# Patient Record
Sex: Female | Born: 1985 | Race: White | Hispanic: No | Marital: Married | Smoking: Never smoker
Health system: Southern US, Community
[De-identification: ages and names within clinical notes are randomized; demographics above are authoritative.]

## PROBLEM LIST (undated history)

## (undated) ENCOUNTER — Inpatient Hospital Stay (HOSPITAL_COMMUNITY): Payer: Self-pay

## (undated) DIAGNOSIS — J9801 Acute bronchospasm: Secondary | ICD-10-CM

## (undated) DIAGNOSIS — O1400 Mild to moderate pre-eclampsia, unspecified trimester: Secondary | ICD-10-CM

## (undated) DIAGNOSIS — Z3490 Encounter for supervision of normal pregnancy, unspecified, unspecified trimester: Secondary | ICD-10-CM

## (undated) DIAGNOSIS — Z789 Other specified health status: Secondary | ICD-10-CM

## (undated) DIAGNOSIS — D62 Acute posthemorrhagic anemia: Secondary | ICD-10-CM

## (undated) HISTORY — DX: Mild to moderate pre-eclampsia, unspecified trimester: O14.00

## (undated) HISTORY — DX: Acute bronchospasm: J98.01

## (undated) HISTORY — DX: Encounter for supervision of normal pregnancy, unspecified, unspecified trimester: Z34.90

---

## 2011-04-02 HISTORY — PX: WISDOM TOOTH EXTRACTION: SHX21

## 2012-11-28 ENCOUNTER — Ambulatory Visit (INDEPENDENT_AMBULATORY_CARE_PROVIDER_SITE_OTHER): Payer: 59 | Admitting: Gynecology

## 2012-11-28 ENCOUNTER — Encounter: Payer: Self-pay | Admitting: Gynecology

## 2012-11-28 VITALS — BP 110/74 | Ht 63.5 in | Wt 215.0 lb

## 2012-11-28 DIAGNOSIS — Z0000 Encounter for general adult medical examination without abnormal findings: Secondary | ICD-10-CM

## 2012-11-28 DIAGNOSIS — Z309 Encounter for contraceptive management, unspecified: Secondary | ICD-10-CM

## 2012-11-28 DIAGNOSIS — Z124 Encounter for screening for malignant neoplasm of cervix: Secondary | ICD-10-CM

## 2012-11-28 DIAGNOSIS — Z01419 Encounter for gynecological examination (general) (routine) without abnormal findings: Secondary | ICD-10-CM

## 2012-11-28 LAB — POCT URINALYSIS DIPSTICK
Blood, UA: 2
Urobilinogen, UA: NEGATIVE
pH, UA: 5

## 2012-11-28 MED ORDER — LEVONORGESTREL-ETHINYL ESTRAD 0.1-20 MG-MCG PO TABS: 1.0000 | ORAL_TABLET | Freq: Every day | ORAL | Status: DC

## 2012-11-28 NOTE — Patient Instructions (Addendum)
Contraception Choices  Contraception (birth control) is the use of any methods or devices to prevent pregnancy. Below are some methods to help avoid pregnancy.  HORMONAL METHODS   · Contraceptive implant. This is a thin, plastic tube containing progesterone hormone. It does not contain estrogen hormone. Your caregiver inserts the tube in the inner part of the upper arm. The tube can remain in place for up to 3 years. After 3 years, the implant must be removed. The implant prevents the ovaries from releasing an egg (ovulation), thickens the cervical mucus which prevents sperm from entering the uterus, and thins the lining of the inside of the uterus.  · Progesterone-only injections. These injections are given every 3 months by your caregiver to prevent pregnancy. This synthetic progesterone hormone stops the ovaries from releasing eggs. It also thickens cervical mucus and changes the uterine lining. This makes it harder for sperm to survive in the uterus.  · Birth control pills. These pills contain estrogen and progesterone hormone. They work by stopping the egg from forming in the ovary (ovulation). Birth control pills are prescribed by a caregiver. Birth control pills can also be used to treat heavy periods.  · Minipill. This type of birth control pill contains only the progesterone hormone. They are taken every day of each month and must be prescribed by your caregiver.  · Birth control patch. The patch contains hormones similar to those in birth control pills. It must be changed once a week and is prescribed by a caregiver.  · Vaginal ring. The ring contains hormones similar to those in birth control pills. It is left in the vagina for 3 weeks, removed for 1 week, and then a new one is put back in place. The patient must be comfortable inserting and removing the ring from the vagina. A caregiver's prescription is necessary.  · Emergency contraception. Emergency contraceptives prevent pregnancy after unprotected  sexual intercourse. This pill can be taken right after sex or up to 5 days after unprotected sex. It is most effective the sooner you take the pills after having sexual intercourse. Emergency contraceptive pills are available without a prescription. Check with your pharmacist. Do not use emergency contraception as your only form of birth control.  BARRIER METHODS   · Female condom. This is a thin sheath (latex or rubber) that is worn over the penis during sexual intercourse. It can be used with spermicide to increase effectiveness.  · Female condom. This is a soft, loose-fitting sheath that is put into the vagina before sexual intercourse.  · Diaphragm. This is a soft, latex, dome-shaped barrier that must be fitted by a caregiver. It is inserted into the vagina, along with a spermicidal jelly. It is inserted before intercourse. The diaphragm should be left in the vagina for 6 to 8 hours after intercourse.  · Cervical cap. This is a round, soft, latex or plastic cup that fits over the cervix and must be fitted by a caregiver. The cap can be left in place for up to 48 hours after intercourse.  · Sponge. This is a soft, circular piece of polyurethane foam. The sponge has spermicide in it. It is inserted into the vagina after wetting it and before sexual intercourse.  · Spermicides. These are chemicals that kill or block sperm from entering the cervix and uterus. They come in the form of creams, jellies, suppositories, foam, or tablets. They do not require a prescription. They are inserted into the vagina with an applicator before having sexual intercourse.   The process must be repeated every time you have sexual intercourse.  INTRAUTERINE CONTRACEPTION  · Intrauterine device (IUD). This is a T-shaped device that is put in a woman's uterus during a menstrual period to prevent pregnancy. There are 2 types:  · Copper IUD. This type of IUD is wrapped in copper wire and is placed inside the uterus. Copper makes the uterus and  fallopian tubes produce a fluid that kills sperm. It can stay in place for 10 years.  · Hormone IUD. This type of IUD contains the hormone progestin (synthetic progesterone). The hormone thickens the cervical mucus and prevents sperm from entering the uterus, and it also thins the uterine lining to prevent implantation of a fertilized egg. The hormone can weaken or kill the sperm that get into the uterus. It can stay in place for 5 years.  PERMANENT METHODS OF CONTRACEPTION  · Female tubal ligation. This is when the woman's fallopian tubes are surgically sealed, tied, or blocked to prevent the egg from traveling to the uterus.  · Female sterilization. This is when the female has the tubes that carry sperm tied off (vasectomy). This blocks sperm from entering the vagina during sexual intercourse. After the procedure, the man can still ejaculate fluid (semen).  NATURAL PLANNING METHODS  · Natural family planning. This is not having sexual intercourse or using a barrier method (condom, diaphragm, cervical cap) on days the woman could become pregnant.  · Calendar method. This is keeping track of the length of each menstrual cycle and identifying when you are fertile.  · Ovulation method. This is avoiding sexual intercourse during ovulation.  · Symptothermal method. This is avoiding sexual intercourse during ovulation, using a thermometer and ovulation symptoms.  · Post-ovulation method. This is timing sexual intercourse after you have ovulated.  Regardless of which type or method of contraception you choose, it is important that you use condoms to protect against the transmission of sexually transmitted diseases (STDs). Talk with your caregiver about which form of contraception is most appropriate for you.  Document Released: 06/18/2005 Document Revised: 09/10/2011 Document Reviewed: 10/25/2010  [employer]® Patient Information ©2014 [employer].

## 2012-11-28 NOTE — Progress Notes (Signed)
27 y.o.   Married    Caucasian   female   G0P0000   here for annual exam. Pt reports 15# since being on oc, married 1y not interested in conception at this time, maybe 1y.    Patient's last menstrual period was 11/16/2012.          Sexually active: yes  The current method of family planning is OCP (estrogen/progesterone).    Exercising: walking 7x/wk  Last mammogram: none  Last pap smear: never had one History of abnormal pap: no Smoking: no Alcohol: no Last colonoscopy: none Last Bone Density:  none Last tetanus shot: 03/2012 Last cholesterol check: none BSE:  no  Hgb: 14.2             Urine:   Family History  Problem Relation Age of Onset  . Hypertension Mother   . Polycystic ovary syndrome Sister   . Diabetes Maternal Grandmother   . Thyroid disease Maternal Grandmother   . Diabetes Maternal Grandfather   . Breast cancer Paternal Grandmother     There are no active problems to display for this patient.   No past medical history on file.  Past Surgical History  Procedure Laterality Date  . Wisdom tooth extraction  04/2011    Allergies: Review of patient's allergies indicates no known allergies.  Current Outpatient Prescriptions  Medication Sig Dispense Refill  . levonorgestrel-ethinyl estradiol (AVIANE,ALESSE,LESSINA) 0.1-20 MG-MCG tablet Take 1 tablet by mouth daily.       No current facility-administered medications for this visit.    ROS: Pertinent items are noted in HPI.  Social Hx:    Exam:    BP 110/74  Ht 5' 3.5" (1.613 m)  Wt 215 lb (97.523 kg)  BMI 37.48 kg/m2  LMP 11/16/2012   Wt Readings from Last 3 Encounters:  11/28/12 215 lb (97.523 kg)     Ht Readings from Last 3 Encounters:  11/28/12 5' 3.5" (1.613 m)    General appearance: alert, cooperative and appears stated age Head: Normocephalic, without obvious abnormality, atraumatic Neck: no adenopathy, supple, symmetrical, trachea midline and thyroid not enlarged, symmetric, no  tenderness/mass/nodules Lungs: clear to auscultation bilaterally Breasts: Inspection negative, No nipple retraction or dimpling, No nipple discharge or bleeding, No axillary or supraclavicular adenopathy, Normal to palpation without dominant masses Heart: regular rate and rhythm Abdomen: soft, non-tender; bowel sounds normal; no masses,  no organomegaly Extremities: extremities normal, atraumatic, no cyanosis or edema Skin: Skin color, texture, turgor normal. No rashes or lesions Lymph nodes: Cervical, supraclavicular, and axillary nodes normal. No abnormal inguinal nodes palpated Neurologic: Grossly normal   Pelvic: External genitalia: questionable ectopic nipple on lateral right labia unchanged in  Childhood, fleshy with small head              Urethra:  normal appearing urethra with no masses, tenderness or lesions              Bartholins and Skenes: normal                 Vagina: normal appearing vagina with normal color and discharge, no lesions              Cervix: normal appearance              Pap taken: yes        Bimanual Exam:  Uterus:  uterus is normal size, shape, consistency and nontender  Adnexa: normal adnexa in size, nontender and no masses                                      Rectovaginal: Confirms                                      Anus:  normal sphincter tone, no lesions  A: normal exam Contraceptive management     P: pap smear with reflex counseled on use and side effects of OCP's Discussed diet and exercise  return annually or prn     An After Visit Summary was printed and given to the patient.

## 2013-05-07 ENCOUNTER — Other Ambulatory Visit: Payer: Self-pay

## 2014-02-26 ENCOUNTER — Ambulatory Visit (INDEPENDENT_AMBULATORY_CARE_PROVIDER_SITE_OTHER): Payer: 59 | Admitting: Gynecology

## 2014-02-26 ENCOUNTER — Encounter: Payer: Self-pay | Admitting: Gynecology

## 2014-02-26 VITALS — BP 118/66 | HR 76 | Resp 14 | Ht 63.5 in | Wt 227.0 lb

## 2014-02-26 DIAGNOSIS — Z0000 Encounter for general adult medical examination without abnormal findings: Secondary | ICD-10-CM

## 2014-02-26 DIAGNOSIS — Z01419 Encounter for gynecological examination (general) (routine) without abnormal findings: Secondary | ICD-10-CM

## 2014-02-26 DIAGNOSIS — Z3169 Encounter for other general counseling and advice on procreation: Secondary | ICD-10-CM

## 2014-02-26 DIAGNOSIS — Z124 Encounter for screening for malignant neoplasm of cervix: Secondary | ICD-10-CM

## 2014-02-26 LAB — HEMOGLOBIN, FINGERSTICK: Hemoglobin, fingerstick: 13.7 g/dL (ref 12.0–16.0)

## 2014-02-26 NOTE — Progress Notes (Signed)
28 y.o. Married Caucasian female   G0P0000 here for annual exam. Pt is currently sexually active.  She reports using condoms on a regular basis.  First sexual activity at 28 years old, 1 number of lifetime partners. Pt stopped op in March 2015 to try to concieve, cycles have been regular, minimal dysmenorrhea.  Pt's husband works out of town for now.  No dyspareunia.  Patient's last menstrual period was 02/11/2014.          Sexually active: Yes.    The current method of family planning is none.    Exercising: Yes.    walking occassionally Last pap: 11/28/12 Neg Alcohol: no Tobacco: no Drugs: no Gardisil: no, completed:   Hgb: 13.7 ; Urine: Leuks 2  Health Maintenance  Topic Date Due  . Tetanus/tdap  11/10/2004  . Influenza Vaccine  01/30/2014  . Pap Smear  11/29/2015    Family History  Problem Relation Age of Onset  . Hypertension Mother   . Polycystic ovary syndrome Sister   . Diabetes Maternal Grandmother   . Thyroid disease Maternal Grandmother   . Diabetes Maternal Grandfather   . Breast cancer Paternal Grandmother     There are no active problems to display for this patient.   No past medical history on file.  Past Surgical History  Procedure Laterality Date  . Wisdom tooth extraction  04/2011    Allergies: Review of patient's allergies indicates no known allergies.  Current Outpatient Prescriptions  Medication Sig Dispense Refill  . levonorgestrel-ethinyl estradiol (AVIANE,ALESSE,LESSINA) 0.1-20 MG-MCG tablet Take 1 tablet by mouth daily.      Marland Kitchen levonorgestrel-ethinyl estradiol (AVIANE,ALESSE,LESSINA) 0.1-20 MG-MCG tablet Take 1 tablet by mouth daily.  1 Package  12   No current facility-administered medications for this visit.    ROS: Pertinent items are noted in HPI.  Exam:    BP 118/66  Pulse 76  Resp 14  Ht 5' 3.5" (1.613 m)  Wt 227 lb (102.967 kg)  BMI 39.58 kg/m2  LMP 02/11/2014 Weight change: @ Last 3 height recordings:  Ht  Readings from Last 3 Encounters:  02/26/14 5' 3.5" (1.613 m)  11/28/12 5' 3.5" (1.613 m)   General appearance: alert, cooperative and appears stated age Head: Normocephalic, without obvious abnormality, atraumatic Neck: no adenopathy, no carotid bruit, no JVD, supple, symmetrical, trachea midline and thyroid not enlarged, symmetric, no tenderness/mass/nodules Lungs: clear to auscultation bilaterally Breasts: normal appearance, no masses or tenderness Heart: regular rate and rhythm, S1, S2 normal, no murmur, click, rub or gallop Abdomen: soft, non-tender; bowel sounds normal; no masses,  no organomegaly Extremities: extremities normal, atraumatic, no cyanosis or edema Skin: Skin color, texture, turgor normal. No rashes or lesions Lymph nodes: Cervical, supraclavicular, and axillary nodes normal. no inguinal nodes palpated Neurologic: Grossly normal   Pelvic: External genitalia:  no lesions              Urethra: normal appearing urethra with no masses, tenderness or lesions              Bartholins and Skenes: Bartholin's, Urethra, Skene's normal                 Vagina: normal appearing vagina with normal color and discharge, no lesions              Cervix: normal appearance              Pap taken: Yes.          Bimanual Exam:  Uterus:  uterus is normal size, shape, consistency and nontender                                      Adnexa:    normal adnexa in size, nontender and no masses                                      Rectovaginal: Confirms                                      Anus:  normal sphincter tone, no lesions        1. Routine gynecological examination counseled on breast self exam, adequate intake of calcium and vitamin D, diet and exercise return annually or prn  2. Laboratory examination ordered as part of a routine general medical examination  - POCT Urinalysis Dipstick - Hemoglobin, fingerstick  3. Screening for cervical cancer Guidelines reviewed - PAP with  Reflex to HPV (IPS)  4. Pre-conception counseling Pt and husband have not been together except for a few weekend,sister with PCOS.  discussed coital frequency, usual time to conceive, to watch cycle regularity based on sister's history.  Pt has never been dx with pcos. Will not check rubella as she is not interested in vaccine at this time if not immune.    An After Visit Summary was printed and given to the patient.

## 2014-02-28 ENCOUNTER — Encounter: Payer: Self-pay | Admitting: Gynecology

## 2014-03-01 NOTE — Telephone Encounter (Signed)
Spoke with patient. Patient states that she is not having any vaginal itching today. Took azo last night. Patient has used the Monistat OTC 1 day less than a month ago. "Maybe I just had some irritation over the weekend and now it is okay." Advised patient if symptoms reoccur to use OTC Monistat 3 or 5 with hydrocortisone ointment. Advised to keep area clean and dry. Do not stay in wet clothing or tight exercise clothing for long periods of time. Advised to wear white cotton underwear and no thongs. Can try Aveeno sitz bath to relieve itching and irritation. Patient is agreeable and will try this is symptoms return. Will call back if this does not resolve symptoms to make an appointment with Dr.Lathrop.  Routing to provider for final review. Patient agreeable to disposition. Will close encounter

## 2014-03-01 NOTE — Telephone Encounter (Signed)
Left message to call Anakin Varkey at [phone]. 

## 2014-03-02 LAB — IPS PAP TEST WITH REFLEX TO HPV

## 2014-03-26 ENCOUNTER — Ambulatory Visit (INDEPENDENT_AMBULATORY_CARE_PROVIDER_SITE_OTHER): Payer: 59 | Admitting: Obstetrics and Gynecology

## 2014-03-26 ENCOUNTER — Encounter: Payer: Self-pay | Admitting: Obstetrics and Gynecology

## 2014-03-26 VITALS — BP 124/70 | HR 70 | Ht 63.5 in | Wt 227.8 lb

## 2014-03-26 DIAGNOSIS — B373 Candidiasis of vulva and vagina: Secondary | ICD-10-CM

## 2014-03-26 DIAGNOSIS — B3731 Acute candidiasis of vulva and vagina: Secondary | ICD-10-CM

## 2014-03-26 MED ORDER — FLUCONAZOLE 150 MG PO TABS: 150.0000 mg | ORAL_TABLET | Freq: Once | ORAL | Status: DC

## 2014-03-26 MED ORDER — NYSTATIN-TRIAMCINOLONE 100000-0.1 UNIT/GM-% EX CREA
1.0000 | TOPICAL_CREAM | Freq: Two times a day (BID) | CUTANEOUS | Status: DC
Start: 2014-03-26 — End: 2014-08-13

## 2014-03-26 NOTE — Progress Notes (Signed)
Patient ID: Vanessa Rice, female   DOB: 12-11-1985, 28 y.o.   MRN: [id number] GYNECOLOGY VISIT  PCP:   [facility]([place], [place])  Referring provider:   HPI: 28 y.o.   Married  Caucasian  female   G0P0000 with Patient's last menstrual period was 03/11/2014.   here for vaginal irritation/itching--off & on.  No discharge and no odor.   Itching, burning, irritation externally.  Happens off and on.  No odor.  Took Monistat in August.   Seen 02/18/14 for annual exam.  Had itching after visit.  Pap WNL and did not show any BV or yeast.   Took abx in July for bronchitis.  No new exposures.   Taking PNV. Not currently sexually active but would like future pregnancy.   GYNECOLOGIC HISTORY: Patient's last menstrual period was 03/11/2014. Sexually active:  yes Partner preference: female Contraception: none   Menopausal hormone therapy:  DES exposure: no   Blood transfusions:  no  Sexually transmitted diseases: no   GYN procedures and prior surgeries:  no Last mammogram:   n/a              Last pap and high risk HPV testing:  02-26-14 wnl  History of abnormal pap smear:  no   OB History   Grav Para Term Preterm Abortions TAB SAB Ect Mult Living         LIFESTYLE: Exercise:               Tobacco: no Alcohol:   no Drug use:  no  There are no active problems to display for this patient.   Past Medical History  Diagnosis Date  . Bronchial spasms     Past Surgical History  Procedure Laterality Date  . Wisdom tooth extraction  04/2011    No current outpatient prescriptions on file.   No current facility-administered medications for this visit.     ALLERGIES: Review of patient's allergies indicates no known allergies.  Family History  Problem Relation Age of Onset  . Hypertension Mother   . Polycystic ovary syndrome Sister   . Diabetes Maternal Grandmother   . Thyroid disease Maternal Grandmother   . Diabetes Maternal Grandfather    . Breast cancer Paternal Grandmother     History   Social History  . Marital Status: Married    Spouse Name: N/A    Number of Children: N/A  . Years of Education: N/A   Occupational History  . Not on file.   Social History Main Topics  . Smoking status: Never Smoker   . Smokeless tobacco: Never Used  . Alcohol Use: No  . Drug Use: No  . Sexual Activity: Yes    Partners: Male    Birth Control/ Protection: None   Other Topics Concern  . Not on file   Social History Narrative  . No narrative on file    ROS:  Pertinent items are noted in HPI.  PHYSICAL EXAMINATION:    BP 124/70  Pulse 70  Ht 5' 3.5" (1.613 m)  Wt 227 lb 12.8 oz (103.329 kg)  BMI 39.71 kg/m2  LMP 03/11/2014   Wt Readings from Last 3 Encounters:  03/26/14 227 lb 12.8 oz (103.329 kg)  02/26/14 227 lb (102.967 kg)  11/28/12 215 lb (97.523 kg)     Ht Readings from Last 3 Encounters:  03/26/14 5' 3.5" (1.613 m)  02/26/14 5' 3.5" (1.613 m)  11/28/12 5'  3.5" (1.613 m)    General appearance: alert, cooperative and appears stated age  Pelvic: External genitalia:  no lesions              Urethra:  normal appearing urethra with no masses, tenderness or lesions              Bartholins and Skenes: normal                 Vagina: normal appearing vagina with normal color and discharge, no lesions, white clumpy discharge.               Cervix: normal appearance                 Bimanual Exam:  Uterus:  uterus is normal size, shape, consistency and nontender                                      Adnexa: normal adnexa in size, nontender and no masses                                       Wet prep - ph 4.5, positive hyphae.  Negative clue cells and trichomonas.   ASSESSMENT  Yeast vaginitis.   PLAN  Discussed risk factors for yeast infections.  Diflucan and Mycolog II.  See orders.  Instructed in use.  Discussed probiotics and yogurt as ways to decrease yeast infections.    An After Visit Summary was  printed and given to the patient.  15 minutes face to face time of which over 50% was spent in counseling.

## 2014-03-26 NOTE — Patient Instructions (Signed)

## 2014-08-09 ENCOUNTER — Telehealth: Payer: Self-pay | Admitting: Obstetrics and Gynecology

## 2014-08-09 NOTE — Telephone Encounter (Signed)
Spoke with patient. Patient states that her LMP was 1/7. Patient took UPT yesterday that was positive. Would like to come in for confirmation appointment. Requesting appointment on 2/12. Appointment scheduled for 2/12 at 9:15am with Verner Choleborah S. Leonard CNM. Patient is agreeable to date and time.  Routing to provider for final review. Patient agreeable to disposition. Will close encounter

## 2014-08-09 NOTE — Telephone Encounter (Signed)
Patient took a pregnancy test it was positive and she wants to talk with the nurse.

## 2014-08-13 ENCOUNTER — Ambulatory Visit (INDEPENDENT_AMBULATORY_CARE_PROVIDER_SITE_OTHER): Payer: 59 | Admitting: Certified Nurse Midwife

## 2014-08-13 ENCOUNTER — Encounter: Payer: Self-pay | Admitting: Certified Nurse Midwife

## 2014-08-13 VITALS — BP 110/70 | HR 96 | Resp 18 | Ht 63.5 in | Wt 227.0 lb

## 2014-08-13 DIAGNOSIS — Z3201 Encounter for pregnancy test, result positive: Secondary | ICD-10-CM

## 2014-08-13 DIAGNOSIS — N912 Amenorrhea, unspecified: Secondary | ICD-10-CM

## 2014-08-13 LAB — POCT URINE PREGNANCY: Preg Test, Ur: POSITIVE

## 2014-08-13 NOTE — Patient Instructions (Signed)
Prenatal Care  WHAT IS PRENATAL CARE?  Prenatal care means health care during your pregnancy, before your baby is born. It is very important to take care of yourself and your baby during your pregnancy by:   Getting early prenatal care. If you know you are pregnant, or think you might be pregnant, call your health care provider as soon as possible. Schedule a visit for a prenatal exam.  Getting regular prenatal care. Follow your health care provider's schedule for blood and other necessary tests. Do not miss appointments.  Doing everything you can to keep yourself and your baby healthy during your pregnancy.  Getting complete care. Prenatal care should include evaluation of the medical, dietary, educational, psychological, and social needs of you and your significant other. The medical and genetic history of your family and the family of your baby's father should be discussed with your health care provider.  Discussing with your health care provider:  Prescription, over-the-counter, and herbal medicines that you take.  Any history of substance abuse, alcohol use, smoking, and illegal drug use.  Any history of domestic abuse and violence.  Immunizations you have received.  Your nutrition and diet.  The amount of exercise you do.  Any environmental and occupational hazards to which you are exposed.  History of sexually transmitted infections for both you and your partner.  Previous pregnancies you have had. WHY IS PRENATAL CARE SO IMPORTANT?  By regularly seeing your health care provider, you help ensure that problems can be identified early so that they can be treated as soon as possible. Other problems might be prevented. Many studies have shown that early and regular prenatal care is important for the health of mothers and their babies.  HOW CAN I TAKE CARE OF MYSELF WHILE I AM PREGNANT?  Here are ways to take care of yourself and your baby:   Start or continue taking your  multivitamin with 400 micrograms (mcg) of folic acid every day.  Get early and regular prenatal care. It is very important to see a health care provider during your pregnancy. Your health care provider will check at each visit to make sure that you and your baby are healthy. If there are any problems, action can be taken right away to help you and your baby.  Eat a healthy diet that includes:  Fruits.  Vegetables.  Foods low in saturated fat.  Whole grains.  Calcium-rich foods, such as milk, yogurt, and hard cheeses.  Drink 6-8 glasses of liquids a day.  Unless your health care provider tells you not to, try to be physically active for 30 minutes, most days of the week. If you are pressed for time, you can get your activity in through 10-minute segments, three times a day.  Do not smoke, drink alcohol, or use drugs. These can cause long-term damage to your baby. Talk with your health care provider about steps to take to stop smoking. Talk with a member of your faith community, a counselor, a trusted friend, or your health care provider if you are concerned about your alcohol or drug use.  Ask your health care provider before taking any medicine, even over-the-counter medicines. Some medicines are not safe to take during pregnancy.  Get plenty of rest and sleep.  Avoid hot tubs and saunas during pregnancy.  Do not have X-rays taken unless absolutely necessary and with the recommendation of your health care provider. A lead shield can be placed on your abdomen to protect your baby when   X-rays are taken in other parts of your body.  Do not empty the cat litter when you are pregnant. It may contain a parasite that causes an infection called toxoplasmosis, which can cause birth defects. Also, use gloves when working in garden areas used by cats.  Do not eat uncooked or undercooked meats or fish.  Do not eat soft, mold-ripened cheeses (Brie, Camembert, and chevre) or soft, blue-veined  cheese (Danish blue and Roquefort).  Stay away from toxic chemicals like:  Insecticides.  Solvents (some cleaners or paint thinners).  Lead.  Mercury.  Sexual intercourse may continue until the end of the pregnancy, unless you have a medical problem or there is a problem with the pregnancy and your health care provider tells you not to.  Do not wear high-heel shoes, especially during the second half of the pregnancy. You can lose your balance and fall.  Do not take long trips, unless absolutely necessary. Be sure to see your health care provider before going on the trip.  Do not sit in one position for more than 2 hours when on a trip.  Take a copy of your medical records when going on a trip. Know where a hospital is located in the city you are visiting, in case of an emergency.  Most dangerous household products will have pregnancy warnings on their labels. Ask your health care provider about products if you are unsure.  Limit or eliminate your caffeine intake from coffee, tea, sodas, medicines, and chocolate.  Many women continue working through pregnancy. Staying active might help you stay healthier. If you have a question about the safety or the hours you work at your particular job, talk with your health care provider.  Get informed:  Read books.  Watch videos.  Go to childbirth classes for you and your significant other.  Talk with experienced moms.  Ask your health care provider about childbirth education classes for you and your partner. Classes can help you and your partner prepare for the birth of your baby.  Ask about a baby doctor (pediatrician) and methods and pain medicine for labor, delivery, and possible cesarean delivery. HOW OFTEN SHOULD I SEE MY HEALTH CARE PROVIDER DURING PREGNANCY?  Your health care provider will give you a schedule for your prenatal visits. You will have visits more often as you get closer to the end of your pregnancy. An average  pregnancy lasts about 40 weeks.  A typical schedule includes visiting your health care provider:   About once each month during your first 6 months of pregnancy.  Every 2 weeks during the next 2 months.  Weekly in the last month, until the delivery date. Your health care provider will probably want to see you more often if:  You are older than 35 years.  Your pregnancy is high risk because you have certain health problems or problems with the pregnancy, such as:  Diabetes.  High blood pressure.  The baby is not growing on schedule, according to the dates of the pregnancy. Your health care provider will do special tests to make sure you and your baby are not having any serious problems. WHAT HAPPENS DURING PRENATAL VISITS?   At your first prenatal visit, your health care provider will do a physical exam and talk to you about your health history and the health history of your partner and your family. Your health care provider will be able to tell you what date to expect your baby to be born on.  Your   first physical exam will include checks of your blood pressure, measurements of your height and weight, and an exam of your pelvic organs. Your health care provider will do a Pap test if you have not had one recently and will do cultures of your cervix to make sure there is no infection.  At each prenatal visit, there will be tests of your blood, urine, blood pressure, weight, and the progress of the baby will be checked.  At your later prenatal visits, your health care provider will check how you are doing and how your baby is developing. You may have a number of tests done as your pregnancy progresses.  Ultrasound exams are often used to check on your baby's growth and health.  You may have more urine and blood tests, as well as special tests, if needed. These may include amniocentesis to examine fluid in the pregnancy sac, stress tests to check how the baby responds to contractions, or a  biophysical profile to measure your baby's well-being. Your health care provider will explain the tests and why they are necessary.  You should be tested for high blood sugar (gestational diabetes) between the 24th and 28th weeks of your pregnancy.  You should discuss with your health care provider your plans to breastfeed or bottle-feed your baby.  Each visit is also a chance for you to learn about staying healthy during pregnancy and to ask questions. Document Released: 06/21/2003 Document Revised: 06/23/2013 Document Reviewed: 09/02/2013 Exit[employer] Patient Information 2015 [employer]. This information is not intended to replace advice given to you by your health care provider. Make sure you discuss any questions you have with your health care provider.  

## 2014-08-14 ENCOUNTER — Encounter: Payer: Self-pay | Admitting: Certified Nurse Midwife

## 2014-08-14 NOTE — Progress Notes (Signed)
  28 y.o.Married Caucasian female presents with no menses since 07/08/14, which had been regular and normal. Here today complaining of amenorrhea with + UPT 08/08/14.  Positive UPT here today. Planned pregnancy. Patient taking prenatal vitamins daily, no other medications. Patient denies any vaginal bleeding cramping or spotting. Some breast tenderness and slight fatigue and appetite change, otherwise feeling good.   Pertinent items are noted in HPI.   O: Healthy female WD WN Orientation x 3, normal affect. Last Aex 02/26/14 with normal pap smear Declined Rubella titer at aex  Assessment:  Amenorrhea with positive UPT [facility] 5 wk 2 [facility]  Plan: Discussed with patient importance of prenatal care, nutritional needs, foods to avoid, medication use in early pregnancy and comfort measures for nausea . Given information on OB providers in area. Discussed importance of care initiation by 8-[redacted] weeks pregnant. Discussed viability PUS her and patient would like to schedule. Patient will be call with insurance information and scheduled. Discussed warning signs of early pregnancy and need to advise. Questions addressed about [facility]birth classes and [facility]. Patient will schedule prenatal appointment after PUS.  Rv prn, as above   32 minutes in time spent with patient in face to face consultation regarding pregnancy and prenatal care.

## 2014-08-16 NOTE — Progress Notes (Signed)
Reviewed personally.  M. Suzanne Winry Egnew, MD.  

## 2014-08-17 ENCOUNTER — Telehealth: Payer: Self-pay | Admitting: Certified Nurse Midwife

## 2014-08-17 NOTE — Telephone Encounter (Signed)
Left message for patient to call back. Need to go over benefits and schedule PUS °

## 2014-08-22 NOTE — Progress Notes (Signed)
Reviewed personally.  M. Suzanne Aurora Rody, MD.  

## 2014-09-02 ENCOUNTER — Emergency Department (HOSPITAL_COMMUNITY)
Admission: EM | Admit: 2014-09-02 | Discharge: 2014-09-03 | Disposition: A | Discharge status: Home / Self Care | Payer: 59 | Attending: Emergency Medicine | Admitting: Emergency Medicine

## 2014-09-02 ENCOUNTER — Encounter (HOSPITAL_COMMUNITY): Payer: Self-pay | Admitting: Emergency Medicine

## 2014-09-02 DIAGNOSIS — N939 Abnormal uterine and vaginal bleeding, unspecified: Secondary | ICD-10-CM

## 2014-09-02 DIAGNOSIS — O26899 Other specified pregnancy related conditions, unspecified trimester: Secondary | ICD-10-CM

## 2014-09-02 DIAGNOSIS — O034 Incomplete spontaneous abortion without complication: Secondary | ICD-10-CM | POA: Diagnosis not present

## 2014-09-02 DIAGNOSIS — O039 Complete or unspecified spontaneous abortion without complication: Secondary | ICD-10-CM | POA: Diagnosis not present

## 2014-09-02 DIAGNOSIS — Z79899 Other long term (current) drug therapy: Secondary | ICD-10-CM | POA: Diagnosis not present

## 2014-09-02 DIAGNOSIS — Z3A08 8 weeks gestation of pregnancy: Secondary | ICD-10-CM | POA: Insufficient documentation

## 2014-09-02 DIAGNOSIS — R102 Pelvic and perineal pain: Secondary | ICD-10-CM

## 2014-09-02 DIAGNOSIS — Z8709 Personal history of other diseases of the respiratory system: Secondary | ICD-10-CM | POA: Diagnosis not present

## 2014-09-02 DIAGNOSIS — O209 Hemorrhage in early pregnancy, unspecified: Secondary | ICD-10-CM | POA: Insufficient documentation

## 2014-09-02 NOTE — ED Provider Notes (Signed)
CSN: [id number]     Arrival date & time 09/02/14  2329 History   First MD Initiated Contact with Patient 09/02/14 2341     Chief Complaint  Patient presents with  . Vaginal Bleeding     (Consider location/radiation/quality/duration/timing/severity/associated sxs/prior Treatment) Patient is a 29 y.o. female presenting with vaginal bleeding. The history is provided by the patient. No language interpreter was used.  Vaginal Bleeding Quality:  Bright red and spotting Onset quality:  Gradual Duration:  2 days Timing:  Intermittent Chronicity:  New Menstrual history:  Regular Number of pads used:  0 Possible pregnancy: yes   Context: spontaneously   Relieved by:  None tried Worsened by:  Nothing tried Ineffective treatments:  None tried Abdominal pain: cramping.    Vanessa Rice is a 29 y.o. G1P0000 @ [redacted]w[redacted]d gestation who presents to the ED with vaginal bleeding and occasional lower abdominal cramping. She had a visit at Dr. Rondel Baton office to confirm her pregnancy. Since they only do GYN she has a follow up visit scheduled for next week with [facility]. She called Dr. Rondel Baton office and they told her if she had another episode of bleeding and cramping to go to the ED.   Past Medical History  Diagnosis Date  . Bronchial spasms    Past Surgical History  Procedure Laterality Date  . Wisdom tooth extraction  04/2011   Family History  Problem Relation Age of Onset  . Hypertension Mother   . Polycystic ovary syndrome Sister   . Diabetes Maternal Grandmother   . Thyroid disease Maternal Grandmother   . Diabetes Maternal Grandfather   . Breast cancer Paternal Grandmother    History  Substance Use Topics  . Smoking status: Never Smoker   . Smokeless tobacco: Never Used  . Alcohol Use: No   OB History    Gravida Para Term Preterm AB TAB SAB Ectopic Multiple Living       Review of Systems  Gastrointestinal: Abdominal pain: cramping.   Genitourinary: Positive for vaginal bleeding.  all other systems negative    Allergies  Review of patient's allergies indicates no known allergies.  Home Medications   Prior to Admission medications   Medication Sig Start Date End Date Taking? Authorizing Provider  Prenatal Multivit-Min-Fe-FA (PRE-NATAL PO) Take by mouth daily.    Historical Provider, MD   BP 145/72 mmHg  Pulse 95  Temp(Src) 98.3 F (36.8 C) (Oral)  Resp 22  Ht  (1.6 m)  Wt 220 lb (99.791 kg)  BMI 38.98 kg/m2  SpO2 100%  LMP 07/08/2014 Physical Exam  Constitutional: She is oriented to person, place, and time. She appears well-developed and well-nourished.  HENT:  Head: Normocephalic.  Eyes: EOM are normal.  Neck: Neck supple.  Cardiovascular: Normal rate.   Pulmonary/Chest: Effort normal.  Abdominal: Soft. There is no tenderness.  Genitourinary:  External genitalia without lesions, small dark blood vaginal vault. Cervix long, closed, no CMT, no adnexal tenderness. Unable to determine size of uterus due to patient habitus.   Musculoskeletal: Normal range of motion.  Neurological: She is alert and oriented to person, place, and time. No cranial nerve deficit.  Skin: Skin is warm and dry.  Psychiatric: She has a normal mood and affect. Her behavior is normal.  Nursing note and vitals reviewed.   ED Course  Procedures (including critical care time) Labs Review Results for orders placed or performed during the hospital encounter  of 09/02/14 (from the past 24 hour(s))  hCG, quantitative, pregnancy     Status: Abnormal   Collection Time: 09/03/14 12:11 AM  Result Value Ref Range   hCG, Beta Chain, Quant, S 4193 (H) <5 mIU/mL  Basic metabolic panel     Status: Abnormal   Collection Time: 09/03/14 12:11 AM  Result Value Ref Range   Sodium 137 135 - 145 mmol/L   Potassium 4.0 3.5 - 5.1 mmol/L   Chloride 107 96 - 112 mmol/L   CO2 24 19 - 32 mmol/L   Glucose, Bld 111 (H) 70 - 99 mg/dL   BUN 12 6 -  23 mg/dL   Creatinine, Ser 1.610.75 0.50 - 1.10 mg/dL   Calcium 8.4 8.4 - 09.610.5 mg/dL   GFR calc non Af Amer >90 >90 mL/min   GFR calc Af Amer >90 >90 mL/min   Anion gap 6 5 - 15  CBC     Status: Abnormal   Collection Time: 09/03/14 12:11 AM  Result Value Ref Range   WBC 10.6 (H) 4.0 - 10.5 K/uL   RBC 4.27 3.87 - 5.11 MIL/uL   Hemoglobin 13.3 12.0 - 15.0 g/dL   HCT 04.540.4 40.936.0 - 81.146.0 %   MCV 94.6 78.0 - 100.0 fL   MCH 31.1 26.0 - 34.0 pg   MCHC 32.9 30.0 - 36.0 g/dL   RDW 91.412.8 78.211.5 - 95.615.5 %   Platelets 272 150 - 400 K/uL  ABO/Rh     Status: None   Collection Time: 09/03/14 12:15 AM  Result Value Ref Range   ABO/RH(D) A POS   Wet prep, genital     Status: Abnormal   Collection Time: 09/03/14 12:23 AM  Result Value Ref Range   Yeast Wet Prep HPF POC NONE SEEN NONE SEEN   Trich, Wet Prep NONE SEEN NONE SEEN   Clue Cells Wet Prep HPF POC NONE SEEN NONE SEEN   WBC, Wet Prep HPF POC FEW (A) NONE SEEN    Dr. Bebe ShaggyWickline in to do bedside ultrasound, unable to identify YS. Ultrasound called in for transvaginal ultrasound.  MDM  29 y.o. female with vaginal bleeding and lower abdominal cramping in early pregnancy. Patient awaiting ultrasound. Dr. Bebe ShaggyWickline to assume care of the patient @ 0115.      [address]Hope OkobojiM Neese, [place]NP 09/03/14 [id number]  Joya Gaskinsonald W Wickline, MD 09/03/14 [phone]

## 2014-09-02 NOTE — ED Notes (Signed)
Pt reporting light brown spotting. Turned to heavier brown spotting and then bright red blood. Now back to brown spotting. Pt reports she is [redacted] weeks pregnant.

## 2014-09-03 ENCOUNTER — Inpatient Hospital Stay (HOSPITAL_COMMUNITY)
Admission: AD | Admit: 2014-09-03 | Discharge: 2014-09-04 | Disposition: A | Discharge status: Home / Self Care | Payer: 59 | Source: Ambulatory Visit | Attending: Obstetrics & Gynecology | Admitting: Obstetrics & Gynecology

## 2014-09-03 ENCOUNTER — Encounter (HOSPITAL_COMMUNITY): Payer: Self-pay | Admitting: *Deleted

## 2014-09-03 ENCOUNTER — Inpatient Hospital Stay (HOSPITAL_COMMUNITY): Payer: 59

## 2014-09-03 ENCOUNTER — Emergency Department (HOSPITAL_COMMUNITY): Payer: 59

## 2014-09-03 DIAGNOSIS — R109 Unspecified abdominal pain: Secondary | ICD-10-CM | POA: Diagnosis present

## 2014-09-03 DIAGNOSIS — Z3A01 Less than 8 weeks gestation of pregnancy: Secondary | ICD-10-CM | POA: Diagnosis not present

## 2014-09-03 DIAGNOSIS — O039 Complete or unspecified spontaneous abortion without complication: Secondary | ICD-10-CM

## 2014-09-03 DIAGNOSIS — O034 Incomplete spontaneous abortion without complication: Secondary | ICD-10-CM | POA: Diagnosis not present

## 2014-09-03 HISTORY — DX: Other specified health status: Z78.9

## 2014-09-03 LAB — CBC
HEMATOCRIT: 40.4 % (ref 36.0–46.0)
Hemoglobin: 13.3 g/dL (ref 12.0–15.0)
MCH: 31.1 pg (ref 26.0–34.0)
MCHC: 32.9 g/dL (ref 30.0–36.0)
MCV: 94.6 fL (ref 78.0–100.0)
PLATELETS: 272 10*3/uL (ref 150–400)
RBC: 4.27 MIL/uL (ref 3.87–5.11)
RDW: 12.8 % (ref 11.5–15.5)
WBC: 10.6 10*3/uL — AB (ref 4.0–10.5)

## 2014-09-03 LAB — WET PREP, GENITAL
Clue Cells Wet Prep HPF POC: NONE SEEN
Trich, Wet Prep: NONE SEEN
Yeast Wet Prep HPF POC: NONE SEEN

## 2014-09-03 LAB — BASIC METABOLIC PANEL
ANION GAP: 6 (ref 5–15)
BUN: 12 mg/dL (ref 6–23)
CALCIUM: 8.4 mg/dL (ref 8.4–10.5)
CO2: 24 mmol/L (ref 19–32)
Chloride: 107 mmol/L (ref 96–112)
Creatinine, Ser: 0.75 mg/dL (ref 0.50–1.10)
GFR calc Af Amer: >90 mL/min (ref >90)
GFR calc non Af Amer: >90 mL/min (ref >90)
GLUCOSE: 111 mg/dL — AB (ref 70–99)
Potassium: 4 mmol/L (ref 3.5–5.1)
SODIUM: 137 mmol/L (ref 135–145)

## 2014-09-03 LAB — ABO/RH: ABO/RH(D): A POS

## 2014-09-03 LAB — HCG, QUANTITATIVE, PREGNANCY: HCG, BETA CHAIN, QUANT, S: 4193 m[IU]/mL — AB (ref <5)

## 2014-09-03 MED ORDER — PROMETHAZINE HCL 12.5 MG PO TABS: 12.5000 mg | ORAL_TABLET | Freq: Four times a day (QID) | ORAL | Status: DC | PRN

## 2014-09-03 MED ORDER — HYDROCODONE-ACETAMINOPHEN 5-325 MG PO TABS
1.0000 | ORAL_TABLET | Freq: Once | ORAL | Status: AC
  Administered 2014-09-03: 1 via ORAL
  Filled 2014-09-03: qty 1

## 2014-09-03 MED ORDER — HYDROCODONE-ACETAMINOPHEN 5-325 MG PO TABS: 1.0000 | ORAL_TABLET | ORAL | Status: DC | PRN

## 2014-09-03 NOTE — ED Provider Notes (Signed)
IUP noted Will d/c home with OBGYN followup  Joya Gaskinsonald W Ladislaus Repsher, MD 09/03/14 [phone]

## 2014-09-03 NOTE — ED Notes (Signed)
Assisted EDP with pelvic at bedside. Specimen samples sent to lab.

## 2014-09-03 NOTE — MAU Provider Note (Signed)
History     CSN: [id number]  Arrival date and time: 09/03/14 2210   First Provider Initiated Contact with Patient 09/03/14 2237      Chief Complaint  Patient presents with  . Abdominal Pain  . Vaginal Bleeding   HPI   Ms Vanessa Rice is a 29 y.o. female G1P0000 at 46w0dwho presents with worsening abdominal pain and worsening vaginal bleeding. [facility] UKoreashowed and IUP at 577w6d She currently rates her pain 6/10;  Bleeding was bright red and changed to brown in the last 24 hours. Around 5:00 pm her bleeding changed to bright red and heavier than it was last night.   OB History    Gravida Para Term Preterm AB TAB SAB Ectopic Multiple Living   _0      Past Medical History  Diagnosis Date  . Bronchial spasms   . Medical history non-contributory     Past Surgical History  Procedure Laterality Date  . Wisdom tooth extraction  04/2011  . No past surgeries      Family History  Problem Relation Age of Onset  . Hypertension Mother   . Polycystic ovary syndrome Sister   . Diabetes Maternal Grandmother   . Thyroid disease Maternal Grandmother   . Diabetes Maternal Grandfather   . Breast cancer Paternal Grandmother     History  Substance Use Topics  . Smoking status: Never Smoker   . Smokeless tobacco: Never Used  . Alcohol Use: No    Allergies: No Known Allergies  Prescriptions prior to admission  Medication Sig Dispense Refill Last Dose  . Prenatal Multivit-Min-Fe-FA (PRE-NATAL PO) Take by mouth daily.   Past Week at Unknown time   Results for orders placed or performed during the hospital encounter of 09/02/14 (from the past 48 hour(s))  hCG, quantitative, pregnancy     Status: Abnormal   Collection Time: 09/03/14 12:11 AM  Result Value Ref Range   hCG, Beta Chain, Quant, S 4193 (H) <5 mIU/mL    Comment:          GEST. AGE      CONC.  (mIU/mL)   <=1 WEEK        5 - 50     2 WEEKS        50 - 500     3 WEEKS       100 - 10,000     4 WEEKS     1,000 - 30,000     5 WEEKS     3,500 - 115,000   6-8 WEEKS     12,000 - 270,000    12 WEEKS     15,000 - 220,000        FEMALE AND NON-PREGNANT FEMALE:     LESS THAN 5 mIU/mL   Basic metabolic panel     Status: Abnormal   Collection Time: 09/03/14 12:11 AM  Result Value Ref Range   Sodium 137 135 - 145 mmol/L   Potassium 4.0 3.5 - 5.1 mmol/L   Chloride 107 96 - 112 mmol/L   CO2 24 19 - 32 mmol/L   Glucose, Bld 111 (H) 70 - 99 mg/dL   BUN 12 6 - 23 mg/dL   Creatinine, Ser 0.75 0.50 - 1.10 mg/dL   Calcium 8.4 8.4 - 10.5 mg/dL   GFR calc non Af Amer >90 >90 mL/min  GFR calc Af Amer >90 >90 mL/min    Comment: (NOTE) The eGFR has been calculated using the CKD EPI equation. This calculation has not been validated in all clinical situations. eGFR's persistently <90 mL/min signify possible Chronic Kidney Disease.    Anion gap 6 5 - 15  CBC     Status: Abnormal   Collection Time: 09/03/14 12:11 AM  Result Value Ref Range   WBC 10.6 (H) 4.0 - 10.5 K/uL   RBC 4.27 3.87 - 5.11 MIL/uL   Hemoglobin 13.3 12.0 - 15.0 g/dL   HCT 40.4 36.0 - 46.0 %   MCV 94.6 78.0 - 100.0 fL   MCH 31.1 26.0 - 34.0 pg   MCHC 32.9 30.0 - 36.0 g/dL   RDW 12.8 11.5 - 15.5 %   Platelets 272 150 - 400 K/uL  ABO/Rh     Status: None   Collection Time: 09/03/14 12:15 AM  Result Value Ref Range   ABO/RH(D) A POS   Wet prep, genital     Status: Abnormal   Collection Time: 09/03/14 12:23 AM  Result Value Ref Range   Yeast Wet Prep HPF POC NONE SEEN NONE SEEN   Trich, Wet Prep NONE SEEN NONE SEEN   Clue Cells Wet Prep HPF POC NONE SEEN NONE SEEN   WBC, Wet Prep HPF POC FEW (A) NONE SEEN   US Ob Comp Less 14 Wks  09/03/2014   CLINICAL DATA:  Vaginal bleeding and first-trimester pregnancy, with abdominal pain  EXAM: OBSTETRIC <14 WK [place] AND TRANSVAGINAL OB US  TECHNIQUE: Both transabdominal and transvaginal ultrasound examinations were performed for  complete evaluation of the gestation as well as the maternal uterus, adnexal regions, and pelvic cul-de-sac. Transvaginal technique was performed to assess early pregnancy.  COMPARISON:  None.  FINDINGS: Intrauterine gestational sac: Visualized/normal in shape. No subchorionic hemorrhage.  Yolk sac:  Present and normal in size  Embryo:  Present  Cardiac Activity: Not yet seen, within normal limits for crown-rump length.  CRL:  3.1  mm   5 w   6 d                  [place] EDC: 04/30/2015  Maternal uterus/adnexae: The right ovary is not visible due to shadowing bowel. The left ovary is normal in size and appearance. No free pelvic fluid.  IMPRESSION: 1. Single intrauterine gestation at 5 weeks 6 days. 2. No explanation for vaginal bleeding.   Electronically Signed   By: Monte Fantasia M.D.   On: 09/03/2014 03:28   US Ob Transvaginal  09/03/2014   CLINICAL DATA:  Vaginal bleeding since yesterday.  EXAM: OBSTETRIC <14 WK [place] AND TRANSVAGINAL OB US  TECHNIQUE: Both transabdominal and transvaginal ultrasound examinations were performed for complete evaluation of the gestation as well as the maternal uterus, adnexal regions, and pelvic cul-de-sac. Transvaginal technique was performed to assess early pregnancy.  COMPARISON:  Sonography from earlier the same day.  FINDINGS: The previously noted gestational sac has migrated into the cervix, consistent with abortion in progress. The yolk sac is no longer visible. No focal endometrial hematoma.  The ovaries have a normal/physiologic appearance. No free pelvic fluid.  IMPRESSION: Abortion in progress with gestational sac located in the endocervical canal.   Electronically Signed   By: Monte Fantasia M.D.   On: 09/03/2014 23:45   US Ob Transvaginal  09/03/2014   CLINICAL DATA:  Vaginal bleeding and first-trimester pregnancy, with abdominal pain  EXAM: OBSTETRIC <14 WK [place]  AND TRANSVAGINAL OB US  TECHNIQUE: Both transabdominal and transvaginal ultrasound examinations were performed  for complete evaluation of the gestation as well as the maternal uterus, adnexal regions, and pelvic cul-de-sac. Transvaginal technique was performed to assess early pregnancy.  COMPARISON:  None.  FINDINGS: Intrauterine gestational sac: Visualized/normal in shape. No subchorionic hemorrhage.  Yolk sac:  Present and normal in size  Embryo:  Present  Cardiac Activity: Not yet seen, within normal limits for crown-rump length.  CRL:  3.1  mm   5 w   6 d                  [place] EDC: 04/30/2015  Maternal uterus/adnexae: The right ovary is not visible due to shadowing bowel. The left ovary is normal in size and appearance. No free pelvic fluid.  IMPRESSION: 1. Single intrauterine gestation at 5 weeks 6 days. 2. No explanation for vaginal bleeding.   Electronically Signed   By: Monte Fantasia M.D.   On: 09/03/2014 03:28    Review of Systems  Constitutional: Negative for fever and chills.  Cardiovascular: Negative for chest pain.  Gastrointestinal: Positive for nausea, vomiting and abdominal pain.  Genitourinary:       + vaginal bleeding    Physical Exam   Blood pressure 157/90, pulse 100, temperature 98.9 F (37.2 C), resp. rate 22, height _0  (1.6 m), weight 100.245 kg (221 lb), last menstrual period 07/08/2014.  Physical Exam  Constitutional: She is oriented to person, place, and time. She appears well-developed and well-nourished. No distress.  HENT:  Head: Normocephalic.  Eyes: Pupils are equal, round, and reactive to light.  Neck: Neck supple.  Respiratory: Effort normal.  GI: Soft. She exhibits no distension. There is no tenderness. There is no rebound.  Genitourinary:  Speculum exam: Vagina - Small amount of dark red blood with few clots in vaginal canal. Quarter size;? Clear sac removed with fox swab.  Cervix -+ active bleeding  Bimanual exam: Cervix slightly open  Uterus non tender, normal size Adnexa non tender, no masses bilaterally Chaperone present for exam.   Musculoskeletal:  Normal range of motion.  Neurological: She is alert and oriented to person, place, and time.  Skin: Skin is warm. She is not diaphoretic.  Psychiatric: Her behavior is normal.    MAU Course  Procedures  None  MDM  A positive blood type  ?POC on spec exam; sent for surgical pathology   Repeat US Vicodin 1 tab given PO   Assessment and Plan   A:  Incomplete SAB SAB in progress   P:  Discharge home in stable condition RX: phenergan and vicodin Ok to alternate with Ibuprofen as directed on the bottle Return to MAU if symptoms worsen  Bleeding precautions  Follow up in the [place] in 1 week; clinic will call you to schedule Support given   [place], NP 09/04/2014 12:58 AM

## 2014-09-03 NOTE — ED Provider Notes (Signed)
Patient seen/examined in the Emergency Department in conjunction with Midlevel Provider [place] Patient reports vag bleeding and she is pregnant Exam : awake/alert, abd soft to palpation Plan: bedside US did not confirm IUP.  Will need further testing   EMERGENCY DEPARTMENT US PREGNANCY "Study: Limited Ultrasound of the Pelvis"  INDICATIONS:Pregnancy(required) and Vaginal bleeding Multiple views of the uterus and pelvic cavity are obtained with a multi-frequency probe.  APPROACH:Transabdominal   PERFORMED BY: Myself  IMAGES ARCHIVED?: Yes  LIMITATIONS: Body habitus and Emergent procedure  PREGNANCY FREE FLUID: None  PREGNANCY UTERUS FINDINGS:Gestational sac noted   PREGNANCY FINDINGS: Intrauterine gestational sac noted and No yolk sac noted  INTERPRETATION: No visualized intrauterine pregnancy  Will need formal ultrasound     Vanessa Gaskinsonald W Vanessa Yontz, MD 09/03/14 [phone]

## 2014-09-03 NOTE — Progress Notes (Signed)
?   POC noted on spec exam and sent to pathology

## 2014-09-03 NOTE — ED Notes (Signed)
Discharge instructions given, pt demonstrated teach back and verbal understanding. No concerns voiced.  

## 2014-09-03 NOTE — MAU Note (Signed)
Spotting since Weds afternoon. Last night bleeding was red and [facility]. Today was ok and then about 1700 bleeding was brighter red and cramping is worse

## 2014-09-03 NOTE — Discharge Instructions (Signed)
Incomplete Miscarriage A miscarriage is the sudden loss of an unborn baby (fetus) before the 20th week of pregnancy. In an incomplete miscarriage, parts of the fetus or placenta (afterbirth) remain in the body.  Having a miscarriage can be an emotional experience. Talk with your health care provider about any questions you may have about miscarrying, the grieving process, and your future pregnancy plans. CAUSES   Problems with the fetal chromosomes that make it impossible for the baby to develop normally. Problems with the baby's genes or chromosomes are most often the result of errors that occur by chance as the embryo divides and grows. The problems are not inherited from the parents.  Infection of the cervix or uterus.  Hormone problems.  Problems with the cervix, such as having an incompetent cervix. This is when the tissue in the cervix is not strong enough to hold the pregnancy.  Problems with the uterus, such as an abnormally shaped uterus, uterine fibroids, or congenital abnormalities.  Certain medical conditions.  Smoking, drinking alcohol, or taking illegal drugs.  Trauma. SYMPTOMS   Vaginal bleeding or spotting, with or without cramps or pain.  Pain or cramping in the abdomen or lower back.  Passing fluid, tissue, or blood clots from the vagina. DIAGNOSIS  Your health care provider will perform a physical exam. You may also have an ultrasound to confirm the miscarriage. Blood or urine tests may also be ordered. TREATMENT   Usually, a dilation and curettage (D&C) procedure is performed. During a D&C procedure, the cervix is widened (dilated) and any remaining fetal or placental tissue is gently removed from the uterus.  Antibiotic medicines are prescribed if there is an infection. Other medicines may be given to reduce the size of the uterus (contract) if there is a lot of bleeding.  If you have Rh negative blood and your baby was Rh positive, you will need a Rho (D)  immune globulin shot. This shot will protect any future baby from having Rh blood problems in future pregnancies.  You may be confined to bed rest. This means you should stay in bed and only get up to use the bathroom. HOME CARE INSTRUCTIONS   Rest as directed by your health care provider.  Restrict activity as directed by your health care provider. You may be allowed to continue light activity if curettage was not done but you require further treatment.  Keep track of the number of pads you use each day. Keep track of how soaked (saturated) they are. Record this information.  Do not  use tampons.  Do not douche or have sexual intercourse until approved by your health care provider.  Keep all follow-up appointments for reevaluation and continuing management.  Only take over-the-counter or prescription medicines for pain, fever, or discomfort as directed by your health care provider.  Take antibiotic medicine as directed by your health care provider. Make sure you finish it even if you start to feel better. SEEK IMMEDIATE MEDICAL CARE IF:   You experience severe cramps in your stomach, back, or abdomen.  You have an unexplained temperature (make sure to record these temperatures).  You pass large clots or tissue (save these for your health care provider to inspect).  Your bleeding increases.  You become light-headed, weak, or have fainting episodes. MAKE SURE YOU:   Understand these instructions.  Will watch your condition.  Will get help right away if you are not doing well or get worse. Document Released: 06/18/2005 Document Revised: 11/02/2013 Document Reviewed:   01/15/2013 Exit[employer] Patient Information 2015 [employer]. This information is not intended to replace advice given to you by your health care provider. Make sure you discuss any questions you have with your health care provider.  

## 2014-09-04 LAB — RPR: RPR: NONREACTIVE

## 2014-09-04 NOTE — Progress Notes (Signed)
Written and verbal d/c instructions given and understanding voiced. 

## 2014-09-06 LAB — GC/CHLAMYDIA PROBE AMP (~~LOC~~) NOT AT ARMC
Chlamydia: NEGATIVE
NEISSERIA GONORRHEA: NEGATIVE

## 2014-09-13 ENCOUNTER — Encounter: Payer: Self-pay | Admitting: Obstetrics & Gynecology

## 2014-09-13 ENCOUNTER — Ambulatory Visit (INDEPENDENT_AMBULATORY_CARE_PROVIDER_SITE_OTHER): Payer: 59 | Admitting: Obstetrics & Gynecology

## 2014-09-13 VITALS — BP 116/60 | HR 77 | Temp 97.5°F | Ht 63.0 in | Wt 223.7 lb

## 2014-09-13 DIAGNOSIS — O039 Complete or unspecified spontaneous abortion without complication: Secondary | ICD-10-CM

## 2014-09-13 NOTE — Progress Notes (Signed)
Subjective:     Patient ID: Vanessa Rice, female   DOB: Apr 25, 1986, 29 y.o.   MRN: [id number]  HPI Seen in MAU 3/5 for Sab, was being seen by [facility]  But told to f/u here Expand All Collapse All    History     CSN: [id number]  Arrival date and time: 09/03/14 2210  First Provider Initiated Contact with Patient 09/03/14 2237    Chief Complaint  Patient presents with  . Abdominal Pain  . Vaginal Bleeding   HPI   Vanessa Rice is a 29 y.o. female G1P0000 at 46w0dwho presents with worsening abdominal pain and worsening vaginal bleeding. [facility] UKoreashowed and IUP at 581w6dShe currently rates her pain 6/10;  Bleeding was bright red and changed to brown in the last 24 hours. Around 5:00 pm her bleeding changed to bright red and heavier than it was last night.   OB History    Gravida Para Term Preterm AB TAB SAB Ectopic Multiple Living   '1 0 0 0 0 0 0 0 0 0 '      Past Medical History  Diagnosis Date  . Bronchial spasms   . Medical history non-contributory     Past Surgical History  Procedure Laterality Date  . Wisdom tooth extraction  04/2011  . No past surgeries      Family History  Problem Relation Age of Onset  . Hypertension Mother   . Polycystic ovary syndrome Sister   . Diabetes Maternal Grandmother   . Thyroid disease Maternal Grandmother   . Diabetes Maternal Grandfather   . Breast cancer Paternal Grandmother     History  Substance Use Topics  . Smoking status: Never Smoker   . Smokeless tobacco: Never Used  . Alcohol Use: No    Allergies: No Known Allergies  Prescriptions prior to admission  Medication Sig Dispense Refill Last Dose  . Prenatal Multivit-Min-Fe-FA (PRE-NATAL PO) Take by mouth daily.   Past Week at Unknown time    Lab Results Last 48 Hours    Results for orders placed or performed during the hospital encounter of 09/02/14 (from the past 48 hour(s))  hCG, quantitative, pregnancy Status: Abnormal   Collection Time: 09/03/14 12:11 AM  Result Value Ref Range   hCG, Beta Chain, Quant, S 4193 (H) <5 mIU/mL    Comment:    GEST. AGE CONC. (mIU/mL)  <=1 WEEK 5 - 50  2 WEEKS 50 - 500  3 WEEKS 100 - 10,000  4 WEEKS 1,000 - 30,000  5 WEEKS 3,500 - 115,000  6-8 WEEKS 12,000 - 270,000  12 WEEKS 15,000 - 220,000   FEMALE AND NON-PREGNANT FEMALE:  LESS THAN 5 mIU/mL   Basic metabolic panel Status: Abnormal   Collection Time: 09/03/14 12:11 AM  Result Value Ref Range   Sodium 137 135 - 145 mmol/L   Potassium 4.0 3.5 - 5.1 mmol/L   Chloride 107 96 - 112 mmol/L   CO2 24 19 - 32 mmol/L   Glucose, Bld 111 (H) 70 - 99 mg/dL   BUN 12 6 - 23 mg/dL   Creatinine, Ser 0.75 0.50 - 1.10 mg/dL   Calcium 8.4 8.4 - 10.5 mg/dL   GFR calc non Af Amer >90 >90 mL/min   GFR calc Af Amer >90 >90 mL/min    Comment: (NOTE) The eGFR has been calculated using the CKD EPI equation. This calculation has  not been validated in all clinical situations. eGFR's persistently <90 mL/min signify possible Chronic Kidney Disease.    Anion gap 6 5 - 15  CBC Status: Abnormal   Collection Time: 09/03/14 12:11 AM  Result Value Ref Range   WBC 10.6 (H) 4.0 - 10.5 K/uL   RBC 4.27 3.87 - 5.11 MIL/uL   Hemoglobin 13.3 12.0 - 15.0 g/dL   HCT 40.4 36.0 - 46.0 %   MCV 94.6 78.0 - 100.0 fL   MCH 31.1 26.0 - 34.0 pg   MCHC 32.9 30.0 - 36.0 g/dL   RDW 12.8 11.5 - 15.5 %   Platelets 272 150 - 400 K/uL  ABO/Rh Status: None   Collection Time: 09/03/14 12:15 AM  Result Value Ref Range   ABO/RH(D) A POS   Wet prep, genital Status: Abnormal   Collection Time:  09/03/14 12:23 AM  Result Value Ref Range   Yeast Wet Prep HPF POC NONE SEEN NONE SEEN   Trich, Wet Prep NONE SEEN NONE SEEN   Clue Cells Wet Prep HPF POC NONE SEEN NONE SEEN   WBC, Wet Prep HPF POC FEW (A) NONE SEEN      Imaging Results (Last 48 hours)    US Ob Comp Less 14 Wks  09/03/2014 CLINICAL DATA: Vaginal bleeding and first-trimester pregnancy, with abdominal pain EXAM: OBSTETRIC <14 WK [place] AND TRANSVAGINAL OB US TECHNIQUE: Both transabdominal and transvaginal ultrasound examinations were performed for complete evaluation of the gestation as well as the maternal uterus, adnexal regions, and pelvic cul-de-sac. Transvaginal technique was performed to assess early pregnancy. COMPARISON: None. FINDINGS: Intrauterine gestational sac: Visualized/normal in shape. No subchorionic hemorrhage. Yolk sac: Present and normal in size Embryo: Present Cardiac Activity: Not yet seen, within normal limits for crown-rump length. CRL: 3.1 mm 5 w 6 d [place] EDC: 04/30/2015 Maternal uterus/adnexae: The right ovary is not visible due to shadowing bowel. The left ovary is normal in size and appearance. No free pelvic fluid. IMPRESSION: 1. Single intrauterine gestation at 5 weeks 6 days. 2. No explanation for vaginal bleeding. Electronically Signed By: Monte Fantasia M.D. On: 09/03/2014 03:28   US Ob Transvaginal  09/03/2014 CLINICAL DATA: Vaginal bleeding since yesterday. EXAM: OBSTETRIC <14 WK [place] AND TRANSVAGINAL OB US TECHNIQUE: Both transabdominal and transvaginal ultrasound examinations were performed for complete evaluation of the gestation as well as the maternal uterus, adnexal regions, and pelvic cul-de-sac. Transvaginal technique was performed to assess early pregnancy. COMPARISON: Sonography from earlier the same day. FINDINGS: The previously noted gestational sac has migrated into the cervix, consistent with abortion in progress. The yolk sac  is no longer visible. No focal endometrial hematoma. The ovaries have a normal/physiologic appearance. No free pelvic fluid. IMPRESSION: Abortion in progress with gestational sac located in the endocervical canal. Electronically Signed By: Monte Fantasia M.D. On: 09/03/2014 23:45   US Ob Transvaginal  09/03/2014 CLINICAL DATA: Vaginal bleeding and first-trimester pregnancy, with abdominal pain EXAM: OBSTETRIC <14 WK [place] AND TRANSVAGINAL OB US TECHNIQUE: Both transabdominal and transvaginal ultrasound examinations were performed for complete evaluation of the gestation as well as the maternal uterus, adnexal regions, and pelvic cul-de-sac. Transvaginal technique was performed to assess early pregnancy. COMPARISON: None. FINDINGS: Intrauterine gestational sac: Visualized/normal in shape. No subchorionic hemorrhage. Yolk sac: Present and normal in size Embryo: Present Cardiac Activity: Not yet seen, within normal limits for crown-rump length. CRL: 3.1 mm 5 w 6 d [place] EDC: 04/30/2015 Maternal uterus/adnexae: The right ovary is not visible due to shadowing bowel.  The left ovary is normal in size and appearance. No free pelvic fluid. IMPRESSION: 1. Single intrauterine gestation at 5 weeks 6 days. 2. No explanation for vaginal bleeding. Electronically Signed By: Monte Fantasia M.D. On: 09/03/2014 03:28     Review of Systems  Constitutional: Negative for fever and chills.  Cardiovascular: Negative for chest pain.  Gastrointestinal: Positive for nausea, vomiting and abdominal pain.  Genitourinary:   + vaginal bleeding   Physical Exam   Blood pressure 157/90, pulse 100, temperature 98.9 F (37.2 C), resp. rate 22, height '5\' 3"'  (1.6 m), weight 100.245 kg (221 lb), last menstrual period 07/08/2014.  Physical Exam  Constitutional: She is oriented to person, place, and time. She appears well-developed and well-nourished. No distress.  HENT:  Head:  Normocephalic.  Eyes: Pupils are equal, round, and reactive to light.  Neck: Neck supple.  Respiratory: Effort normal.  GI: Soft. She exhibits no distension. There is no tenderness. There is no rebound.  Genitourinary:  Speculum exam: Vagina - Small amount of dark red blood with few clots in vaginal canal. Quarter size;? Clear sac removed with fox swab.  Cervix -+ active bleeding  Bimanual exam: Cervix slightly open  Uterus non tender, normal size Adnexa non tender, no masses bilaterally Chaperone present for exam.  Musculoskeletal: Normal range of motion.  Neurological: She is alert and oriented to person, place, and time.  Skin: Skin is warm. She is not diaphoretic.  Psychiatric: Her behavior is normal.    MAU Course  Procedures  None  MDM  A positive blood type  ?POC on spec exam; sent for surgical pathology   Repeat US Vicodin 1 tab given PO   Assessment and Plan   A:  Incomplete SAB SAB in progress   P:  Discharge home in stable condition RX: phenergan and vicodin Ok to alternate with Ibuprofen as directed on the bottle Return to MAU if symptoms worsen  Bleeding precautions  Follow up in the [place] in 1 week; clinic will call you to schedule Support given   [place], NP 09/04/2014 12:58 AM       Review of Systems  Genitourinary: Negative for vaginal bleeding and pelvic pain.       Objective:   Physical Exam  Constitutional: She is oriented to person, place, and time. She appears well-developed. No distress.  Pulmonary/Chest: Effort normal.  Genitourinary:  deferred  Neurological: She is alert and oriented to person, place, and time.  Psychiatric: She has a normal mood and affect. Her behavior is normal.   CBC    Component Value Date/Time   WBC 10.6* 09/03/2014 0011   RBC 4.27 09/03/2014 0011   HGB 13.3 09/03/2014 0011   HCT 40.4 09/03/2014 0011   PLT 272 09/03/2014 0011   MCV 94.6 09/03/2014 0011   MCH 31.1  09/03/2014 0011   MCHC 32.9 09/03/2014 0011   RDW 12.8 09/03/2014 0011        Assessment:     S/P Sab, POC on path specimen     Plan:     F/U PRN at [place], MD 09/13/2014

## 2014-09-13 NOTE — Patient Instructions (Signed)
Miscarriage A miscarriage is the sudden loss of an unborn baby (fetus) before the 20th week of pregnancy. Most miscarriages happen in the first 3 months of pregnancy. Sometimes, it happens before a woman even knows she is pregnant. A miscarriage is also called a "spontaneous miscarriage" or "early pregnancy loss." Having a miscarriage can be an emotional experience. Talk with your caregiver about any questions you may have about miscarrying, the grieving process, and your future pregnancy plans. CAUSES   Problems with the fetal chromosomes that make it impossible for the baby to develop normally. Problems with the baby's genes or chromosomes are most often the result of errors that occur, by chance, as the embryo divides and grows. The problems are not inherited from the parents.  Infection of the cervix or uterus.   Hormone problems.   Problems with the cervix, such as having an incompetent cervix. This is when the tissue in the cervix is not strong enough to hold the pregnancy.   Problems with the uterus, such as an abnormally shaped uterus, uterine fibroids, or congenital abnormalities.   Certain medical conditions.   Smoking, drinking alcohol, or taking illegal drugs.   Trauma.  Often, the cause of a miscarriage is unknown.  SYMPTOMS   Vaginal bleeding or spotting, with or without cramps or pain.  Pain or cramping in the abdomen or lower back.  Passing fluid, tissue, or blood clots from the vagina. DIAGNOSIS  Your caregiver will perform a physical exam. You may also have an ultrasound to confirm the miscarriage. Blood or urine tests may also be ordered. TREATMENT   Sometimes, treatment is not necessary if you naturally pass all the fetal tissue that was in the uterus. If some of the fetus or placenta remains in the body (incomplete miscarriage), tissue left behind may become infected and must be removed. Usually, a dilation and curettage (D and C) procedure is performed.  During a D and C procedure, the cervix is widened (dilated) and any remaining fetal or placental tissue is gently removed from the uterus.  Antibiotic medicines are prescribed if there is an infection. Other medicines may be given to reduce the size of the uterus (contract) if there is a lot of bleeding.  If you have Rh negative blood and your baby was Rh positive, you will need a Rh immunoglobulin shot. This shot will protect any future baby from having Rh blood problems in future pregnancies. HOME CARE INSTRUCTIONS   Your caregiver may order bed rest or may allow you to continue light activity. Resume activity as directed by your caregiver.  Have someone help with home and family responsibilities during this time.   Keep track of the number of sanitary pads you use each day and how soaked (saturated) they are. Write down this information.   Do not use tampons. Do not douche or have sexual intercourse until approved by your caregiver.   Only take over-the-counter or prescription medicines for pain or discomfort as directed by your caregiver.   Do not take aspirin. Aspirin can cause bleeding.   Keep all follow-up appointments with your caregiver.   If you or your partner have problems with grieving, talk to your caregiver or seek counseling to help cope with the pregnancy loss. Allow enough time to grieve before trying to get pregnant again.  SEEK IMMEDIATE MEDICAL CARE IF:   You have severe cramps or pain in your back or abdomen.  You have a fever.  You pass large blood clots (walnut-sized   or larger) ortissue from your vagina. Save any tissue for your caregiver to inspect.   Your bleeding increases.   You have a thick, bad-smelling vaginal discharge.  You become lightheaded, weak, or you faint.   You have chills.  MAKE SURE YOU:  Understand these instructions.  Will watch your condition.  Will get help right away if you are not doing well or get  worse. Document Released: 12/12/2000 Document Revised: 10/13/2012 Document Reviewed: 08/07/2011 [employer] Patient Information 2015 [employer]. This information is not intended to replace advice given to you by your health care provider. Make sure you discuss any questions you have with your health care provider.  

## 2015-01-28 ENCOUNTER — Encounter: Payer: Self-pay | Admitting: Adult Health

## 2015-01-28 ENCOUNTER — Ambulatory Visit (INDEPENDENT_AMBULATORY_CARE_PROVIDER_SITE_OTHER): Payer: 59 | Admitting: Adult Health

## 2015-01-28 VITALS — BP 120/70 | HR 80 | Ht 63.0 in | Wt 232.0 lb

## 2015-01-28 DIAGNOSIS — Z3201 Encounter for pregnancy test, result positive: Secondary | ICD-10-CM | POA: Diagnosis not present

## 2015-01-28 DIAGNOSIS — N926 Irregular menstruation, unspecified: Secondary | ICD-10-CM | POA: Diagnosis not present

## 2015-01-28 DIAGNOSIS — Z3490 Encounter for supervision of normal pregnancy, unspecified, unspecified trimester: Secondary | ICD-10-CM

## 2015-01-28 DIAGNOSIS — O3680X0 Pregnancy with inconclusive fetal viability, not applicable or unspecified: Secondary | ICD-10-CM

## 2015-01-28 DIAGNOSIS — Z3200 Encounter for pregnancy test, result unknown: Secondary | ICD-10-CM

## 2015-01-28 LAB — POCT URINE PREGNANCY: Preg Test, Ur: POSITIVE — AB

## 2015-01-28 NOTE — Patient Instructions (Signed)
First Trimester of Pregnancy The first trimester of pregnancy is from week 1 until the end of week 12 (months 1 through 3). A week after a sperm fertilizes an egg, the egg will implant on the wall of the uterus. This embryo will begin to develop into a baby. Genes from you and your partner are forming the baby. The female genes determine whether the baby is a boy or a girl. At 6-8 weeks, the eyes and face are formed, and the heartbeat can be seen on ultrasound. At the end of 12 weeks, all the baby's organs are formed.  Now that you are pregnant, you will want to do everything you can to have a healthy baby. Two of the most important things are to get good prenatal care and to follow your health care provider's instructions. Prenatal care is all the medical care you receive before the baby's birth. This care will help prevent, find, and treat any problems during the pregnancy and childbirth. BODY CHANGES Your body goes through many changes during pregnancy. The changes vary from woman to woman.   You may gain or lose a couple of pounds at first.  You may feel sick to your stomach (nauseous) and throw up (vomit). If the vomiting is uncontrollable, call your health care provider.  You may tire easily.  You may develop headaches that can be relieved by medicines approved by your health care provider.  You may urinate more often. Painful urination may mean you have a bladder infection.  You may develop heartburn as a result of your pregnancy.  You may develop constipation because certain hormones are causing the muscles that push waste through your intestines to slow down.  You may develop hemorrhoids or swollen, bulging veins (varicose veins).  Your breasts may begin to grow larger and become tender. Your nipples may stick out more, and the tissue that surrounds them (areola) may become darker.  Your gums may bleed and may be sensitive to brushing and flossing.  Dark spots or blotches (chloasma,  mask of pregnancy) may develop on your face. This will likely fade after the baby is born.  Your menstrual periods will stop.  You may have a loss of appetite.  You may develop cravings for certain kinds of food.  You may have changes in your emotions from day to day, such as being excited to be pregnant or being concerned that something may go wrong with the pregnancy and baby.  You may have more vivid and strange dreams.  You may have changes in your hair. These can include thickening of your hair, rapid growth, and changes in texture. Some women also have hair loss during or after pregnancy, or hair that feels dry or thin. Your hair will most likely return to normal after your baby is born. WHAT TO EXPECT AT YOUR PRENATAL VISITS During a routine prenatal visit:  You will be weighed to make sure you and the baby are growing normally.  Your blood pressure will be taken.  Your abdomen will be measured to track your baby's growth.  The fetal heartbeat will be listened to starting around week 10 or 12 of your pregnancy.  Test results from any previous visits will be discussed. Your health care provider may ask you:  How you are feeling.  If you are feeling the baby move.  If you have had any abnormal symptoms, such as leaking fluid, bleeding, severe headaches, or abdominal cramping.  If you have any questions. Other tests   that may be performed during your first trimester include:  Blood tests to find your blood type and to check for the presence of any previous infections. They will also be used to check for low iron levels (anemia) and Rh antibodies. Later in the pregnancy, blood tests for diabetes will be done along with other tests if problems develop.  Urine tests to check for infections, diabetes, or protein in the urine.  An ultrasound to confirm the proper growth and development of the baby.  An amniocentesis to check for possible genetic problems.  Fetal screens for  spina bifida and Down syndrome.  You may need other tests to make sure you and the baby are doing well. HOME CARE INSTRUCTIONS  Medicines  Follow your health care provider's instructions regarding medicine use. Specific medicines may be either safe or unsafe to take during pregnancy.  Take your prenatal vitamins as directed.  If you develop constipation, try taking a stool softener if your health care provider approves. Diet  Eat regular, well-balanced meals. Choose a variety of foods, such as meat or vegetable-based protein, fish, milk and low-fat dairy products, vegetables, fruits, and whole grain breads and cereals. Your health care provider will help you determine the amount of weight gain that is right for you.  Avoid raw meat and uncooked cheese. These carry germs that can cause birth defects in the baby.  Eating four or five small meals rather than three large meals a day may help relieve nausea and vomiting. If you start to feel nauseous, eating a few soda crackers can be helpful. Drinking liquids between meals instead of during meals also seems to help nausea and vomiting.  If you develop constipation, eat more high-fiber foods, such as fresh vegetables or fruit and whole grains. Drink enough fluids to keep your urine clear or pale yellow. Activity and Exercise  Exercise only as directed by your health care provider. Exercising will help you:  Control your weight.  Stay in shape.  Be prepared for labor and delivery.  Experiencing pain or cramping in the lower abdomen or low back is a good sign that you should stop exercising. Check with your health care provider before continuing normal exercises.  Try to avoid standing for long periods of time. Move your legs often if you must stand in one place for a long time.  Avoid heavy lifting.  Wear low-heeled shoes, and practice good posture.  You may continue to have sex unless your health care provider directs you  otherwise. Relief of Pain or Discomfort  Wear a good support bra for breast tenderness.   Take warm sitz baths to soothe any pain or discomfort caused by hemorrhoids. Use hemorrhoid cream if your health care provider approves.   Rest with your legs elevated if you have leg cramps or low back pain.  If you develop varicose veins in your legs, wear support hose. Elevate your feet for 15 minutes, 3-4 times a day. Limit salt in your diet. Prenatal Care  Schedule your prenatal visits by the twelfth week of pregnancy. They are usually scheduled monthly at first, then more often in the last 2 months before delivery.  Write down your questions. Take them to your prenatal visits.  Keep all your prenatal visits as directed by your health care provider. Safety  Wear your seat belt at all times when driving.  Make a list of emergency phone numbers, including numbers for family, friends, the hospital, and police and fire departments. General Tips    Ask your health care provider for a referral to a local prenatal education class. Begin classes no later than at the beginning of month 6 of your pregnancy.  Ask for help if you have counseling or nutritional needs during pregnancy. Your health care provider can offer advice or refer you to specialists for help with various needs.  Do not use hot tubs, steam rooms, or saunas.  Do not douche or use tampons or scented sanitary pads.  Do not cross your legs for long periods of time.  Avoid cat litter boxes and soil used by cats. These carry germs that can cause birth defects in the baby and possibly loss of the fetus by miscarriage or stillbirth.  Avoid all smoking, herbs, alcohol, and medicines not prescribed by your health care provider. Chemicals in these affect the formation and growth of the baby.  Schedule a dentist appointment. At home, brush your teeth with a soft toothbrush and be gentle when you floss. SEEK MEDICAL CARE IF:   You have  dizziness.  You have mild pelvic cramps, pelvic pressure, or nagging pain in the abdominal area.  You have persistent nausea, vomiting, or diarrhea.  You have a bad smelling vaginal discharge.  You have pain with urination.  You notice increased swelling in your face, hands, legs, or ankles. SEEK IMMEDIATE MEDICAL CARE IF:   You have a fever.  You are leaking fluid from your vagina.  You have spotting or bleeding from your vagina.  You have severe abdominal cramping or pain.  You have rapid weight gain or loss.  You vomit blood or material that looks like coffee grounds.  You are exposed to [place] measles and have never had them.  You are exposed to fifth disease or chickenpox.  You develop a severe headache.  You have shortness of breath.  You have any kind of trauma, such as from a fall or a car accident. Document Released: 06/12/2001 Document Revised: 11/02/2013 Document Reviewed: 04/28/2013 [facility] Patient Information 2015 [place], [place]. This information is not intended to replace advice given to you by your health care provider. Make sure you discuss any questions you have with your health care provider. Return in 2 weeks for  Dating [place] Will talk Monday when labs back

## 2015-01-28 NOTE — Progress Notes (Signed)
Subjective:     Patient ID: Vanessa Rice, female   DOB: 1986-05-04, 29 y.o.   MRN: [id number]  HPI Vanessa Rice is a 29 year old white female, married, in for UPT, she has missed a period and had +HPT.She had miscarriage earlier this year.  Review of Systems Patient denies any headaches, hearing loss, fatigue, blurred vision, shortness of breath, chest pain, abdominal pain, problems with bowel movements, urination, or intercourse. No joint pain or mood swings.See HPI for positives.  Reviewed past medical,surgical, social and family history. Reviewed medications and allergies.     Objective:   Physical Exam BP 120/70 mmHg  Pulse 80  Ht  (1.6 m)  Wt 232 lb (105.235 kg)  BMI 41.11 kg/m2  LMP 12/20/2014  Breastfeeding? No UPT+, about 5+4 weeks, with EDD 09/26/15, Skin warm and dry. Neck: mid line trachea, normal thyroid, good ROM, no lymphadenopathy noted. Lungs: clear to ausculation bilaterally. Cardiovascular: regular rate and rhythm.Since had miscarriage earlier this year wil get [facility] and progesterone today.Bllod type A+.    Assessment:     Pregnant     Plan:     Check QHCG and progesterone level Return  in 2 week for dating [place]   Review handout on first trimester

## 2015-01-29 LAB — BETA HCG QUANT (REF LAB): hCG Quant: 4909 m[IU]/mL

## 2015-01-29 LAB — PROGESTERONE: PROGESTERONE: 13.7 ng/mL

## 2015-01-31 ENCOUNTER — Telehealth: Payer: Self-pay | Admitting: Adult Health

## 2015-01-31 NOTE — Telephone Encounter (Signed)
Left message to call about labs 

## 2015-02-11 ENCOUNTER — Ambulatory Visit (INDEPENDENT_AMBULATORY_CARE_PROVIDER_SITE_OTHER): Payer: 59

## 2015-02-11 DIAGNOSIS — O3680X0 Pregnancy with inconclusive fetal viability, not applicable or unspecified: Secondary | ICD-10-CM

## 2015-02-11 NOTE — Progress Notes (Signed)
[place] 12+4wks single IUP w/ys,pos fht 148 bpm,normal ov's bilat,edd 09/26/2015,crl 12.48mm

## 2015-02-21 ENCOUNTER — Encounter: Payer: Self-pay | Admitting: Women's Health

## 2015-02-21 ENCOUNTER — Ambulatory Visit (INDEPENDENT_AMBULATORY_CARE_PROVIDER_SITE_OTHER): Payer: 59 | Admitting: Women's Health

## 2015-02-21 VITALS — BP 118/70 | HR 72 | Wt 230.0 lb

## 2015-02-21 DIAGNOSIS — Z369 Encounter for antenatal screening, unspecified: Secondary | ICD-10-CM

## 2015-02-21 DIAGNOSIS — Z0283 Encounter for blood-alcohol and blood-drug test: Secondary | ICD-10-CM

## 2015-02-21 DIAGNOSIS — Z3682 Encounter for antenatal screening for nuchal translucency: Secondary | ICD-10-CM

## 2015-02-21 DIAGNOSIS — Z331 Pregnant state, incidental: Secondary | ICD-10-CM

## 2015-02-21 DIAGNOSIS — Z1389 Encounter for screening for other disorder: Secondary | ICD-10-CM

## 2015-02-21 DIAGNOSIS — Z3491 Encounter for supervision of normal pregnancy, unspecified, first trimester: Secondary | ICD-10-CM

## 2015-02-21 LAB — POCT URINALYSIS DIPSTICK
Blood, UA: NEGATIVE
GLUCOSE UA: NEGATIVE
Ketones, UA: NEGATIVE
NITRITE UA: NEGATIVE
PROTEIN UA: NEGATIVE

## 2015-02-21 NOTE — Progress Notes (Signed)
  Subjective:  Vanessa Rice is a 29 y.o. G2P0010 Caucasian female at [redacted]w[redacted]d by LMP c/w 7wk u/s, being seen today for her first obstetrical visit.  Her obstetrical history is significant for complete Ab March 2016, obesity.  Pregnancy history fully reviewed.  Patient reports occ vomiting at night- declines meds. Denies vb, cramping, uti s/s, abnormal/malodorous vag d/c, or vulvovaginal itching/irritation.  BP 118/70 mmHg  Pulse 72  Wt 230 lb (104.327 kg)  LMP 12/20/2014  HISTORY: OB History  Gravida Para Term Preterm AB SAB TAB Ectopic Multiple Living     # Outcome Date GA Lbr Len/2nd Weight Sex Delivery Anes PTL Lv  2 Current           1 SAB  [redacted]w[redacted]d            Past Medical History  Diagnosis Date  . Bronchial spasms   . Medical history non-contributory   . Pregnant 01/28/2015   Past Surgical History  Procedure Laterality Date  . Wisdom tooth extraction  04/2011  . No past surgeries     Family History  Problem Relation Age of Onset  . Hypertension Mother   . Hyperlipidemia Mother   . Polycystic ovary syndrome Sister   . Diabetes Maternal Grandmother   . Thyroid disease Maternal Grandmother   . Diabetes Maternal Grandfather   . Breast cancer Paternal Grandmother   . Breast cancer Maternal Aunt 52  . Cancer Maternal Aunt 51    breast    Exam   System:     General: Well developed & nourished, no acute distress   Skin: Warm & dry, normal coloration and turgor, no rashes   Neurologic: Alert & oriented, normal mood   Cardiovascular: Regular rate & rhythm   Respiratory: Effort & rate normal, LCTAB, acyanotic   Abdomen: Soft, non tender   Extremities: normal strength, tone  Thin prep pap smear neg 2015 [place]  FHR: 182 via doppler   Assessment:   Pregnancy: G2P0010 Patient Active Problem List   Diagnosis Date Noted  . Supervision of normal pregnancy 01/28/2015    Priority: High  . Complete miscarriage 09/13/2014    [redacted]w[redacted]d G2P0010 New OB  visit Obesity, pre-gravid BMI 41.1 Mild n/v  Plan:  Initial labs drawn Continue prenatal vitamins Problem list reviewed and updated Reviewed n/v relief measures and warning s/s to report Reviewed recommended weight gain based on pre-gravid BMI Encouraged well-balanced diet Genetic Screening discussed Integrated Screen: requested Cystic fibrosis screening discussed declined Ultrasound discussed; fetal survey: requested Follow up in 3 weeks for 1st it/nt and visit [facility] completed  Marge Duncans CNM, [facility] 02/21/2015 11:06 AM

## 2015-02-21 NOTE — Patient Instructions (Signed)

## 2015-02-22 LAB — PMP SCREEN PROFILE (10S), URINE
Amphetamine Screen, Ur: NEGATIVE ng/mL
BENZODIAZEPINE SCREEN, URINE: NEGATIVE ng/mL
Barbiturate Screen, Ur: NEGATIVE ng/mL
CANNABINOIDS UR QL SCN: NEGATIVE ng/mL
Cocaine(Metab.)Screen, Urine: NEGATIVE ng/mL
Creatinine(Crt), U: 111.2 mg/dL (ref 20.0–300.0)
Methadone Scn, Ur: NEGATIVE ng/mL
OPIATE SCRN UR: NEGATIVE ng/mL
Oxycodone+Oxymorphone Ur Ql Scn: NEGATIVE ng/mL
PCP Scrn, Ur: NEGATIVE ng/mL
PH UR, DRUG SCRN: 6.5 (ref 4.5–8.9)
Propoxyphene, Screen: NEGATIVE ng/mL

## 2015-02-22 LAB — MICROSCOPIC EXAMINATION: Casts: NONE SEEN /lpf

## 2015-02-22 LAB — URINALYSIS, ROUTINE W REFLEX MICROSCOPIC
Bilirubin, UA: NEGATIVE
Glucose, UA: NEGATIVE
KETONES UA: NEGATIVE
NITRITE UA: NEGATIVE
PH UA: 7 (ref 5.0–7.5)
Protein, UA: NEGATIVE
RBC UA: NEGATIVE
SPEC GRAV UA: 1.021 (ref 1.005–1.030)
Urobilinogen, Ur: 0.2 mg/dL (ref 0.2–1.0)

## 2015-02-22 LAB — URINE CULTURE

## 2015-02-22 LAB — HEPATITIS B SURFACE ANTIGEN: HEP B S AG: NEGATIVE

## 2015-02-22 LAB — VARICELLA ZOSTER ANTIBODY, IGG: VARICELLA: 669 {index} (ref >165)

## 2015-02-22 LAB — RPR: RPR Ser Ql: NONREACTIVE

## 2015-02-22 LAB — CBC
HEMATOCRIT: 41.1 % (ref 34.0–46.6)
HEMOGLOBIN: 13.4 g/dL (ref 11.1–15.9)
MCH: 30.7 pg (ref 26.6–33.0)
MCHC: 32.6 g/dL (ref 31.5–35.7)
MCV: 94 fL (ref 79–97)
Platelets: 292 10*3/uL (ref 150–379)
RBC: 4.37 x10E6/uL (ref 3.77–5.28)
RDW: 12.8 % (ref 12.3–15.4)
WBC: 10.5 10*3/uL (ref 3.4–10.8)

## 2015-02-22 LAB — ABO/RH: Rh Factor: POSITIVE

## 2015-02-22 LAB — RUBELLA SCREEN: RUBELLA: 2.53 {index} (ref >0.99)

## 2015-02-22 LAB — GC/CHLAMYDIA PROBE AMP
Chlamydia trachomatis, NAA: NEGATIVE
Neisseria gonorrhoeae by PCR: NEGATIVE

## 2015-02-22 LAB — ANTIBODY SCREEN: Antibody Screen: NEGATIVE

## 2015-02-22 LAB — HIV ANTIBODY (ROUTINE TESTING W REFLEX): HIV SCREEN 4TH GENERATION: NONREACTIVE

## 2015-03-03 ENCOUNTER — Ambulatory Visit (INDEPENDENT_AMBULATORY_CARE_PROVIDER_SITE_OTHER): Payer: 59 | Admitting: Obstetrics and Gynecology

## 2015-03-03 ENCOUNTER — Encounter: Payer: Self-pay | Admitting: Obstetrics and Gynecology

## 2015-03-03 VITALS — BP 120/78 | HR 94 | Wt 226.0 lb

## 2015-03-03 DIAGNOSIS — Z3491 Encounter for supervision of normal pregnancy, unspecified, first trimester: Secondary | ICD-10-CM

## 2015-03-03 DIAGNOSIS — Z331 Pregnant state, incidental: Secondary | ICD-10-CM

## 2015-03-03 DIAGNOSIS — Z1389 Encounter for screening for other disorder: Secondary | ICD-10-CM

## 2015-03-03 DIAGNOSIS — Z3A10 10 weeks gestation of pregnancy: Secondary | ICD-10-CM

## 2015-03-03 LAB — POCT URINALYSIS DIPSTICK
Blood, UA: 3
Glucose, UA: NEGATIVE
KETONES UA: NEGATIVE
Leukocytes, UA: NEGATIVE
Nitrite, UA: NEGATIVE
PROTEIN UA: NEGATIVE

## 2015-03-03 NOTE — Progress Notes (Signed)
Pt worked in for gush of blood after urinating. Pt states that the bleeding was red at first and now she is having a brown bleeding.

## 2015-03-03 NOTE — Progress Notes (Addendum)
Patient ID: Vanessa Rice, female   DOB: 1986/05/23, 29 y.o.   MRN: [id number]  G2P0010 [redacted]w[redacted]d Estimated Date of Delivery: 09/26/15  Blood pressure 120/78, pulse 94, weight 226 lb (102.513 kg), last menstrual period 12/20/2014.   refer to the ob flow sheet for FH and FHR, also BP, Wt, Urine results:notable for 3+ blood   Patient reports  Too early for fetal movement and no rupture of membranes symptoms or regular contractions. Patient complaints: Patient Vanessa Rice for a gush of bleeding after urinating today. She states the bleeding was red at first, but is now brown.  Ultrasound used to visualize a normal sized gestational sac, with 3.6 cm CRL with positive Fetal pole movement , limb movement, and FCA >120.  Posterior placenta, extending down to posterior lower ut segment.  Questions were answered. Assessment: Pregnancy [redacted]w[redacted]d, LROB Plan:  Continued routine obstetrical care avoid sex til 1 wk without bleeding  F/u in 2  weeks for  Scheduled appt.  This chart was SCRIBED for Vanessa Bach, MD by Vanessa Rice, ED Scribe. This patient was seen in room 2, and the patient's care was started at 2:56 PM.  I personally performed the services described in this documentation, which was SCRIBED in my presence. The recorded information has been reviewed and considered accurate. It has been edited as necessary during review. Vanessa Burrow, MD

## 2015-03-18 ENCOUNTER — Encounter: Payer: Self-pay | Admitting: Obstetrics & Gynecology

## 2015-03-18 ENCOUNTER — Ambulatory Visit (INDEPENDENT_AMBULATORY_CARE_PROVIDER_SITE_OTHER): Payer: 59 | Admitting: Obstetrics & Gynecology

## 2015-03-18 ENCOUNTER — Ambulatory Visit (INDEPENDENT_AMBULATORY_CARE_PROVIDER_SITE_OTHER): Payer: 59

## 2015-03-18 VITALS — BP 118/70 | HR 80 | Wt 226.0 lb

## 2015-03-18 DIAGNOSIS — Z3682 Encounter for antenatal screening for nuchal translucency: Secondary | ICD-10-CM

## 2015-03-18 DIAGNOSIS — Z36 Encounter for antenatal screening of mother: Secondary | ICD-10-CM

## 2015-03-18 DIAGNOSIS — Z1389 Encounter for screening for other disorder: Secondary | ICD-10-CM

## 2015-03-18 DIAGNOSIS — Z331 Pregnant state, incidental: Secondary | ICD-10-CM

## 2015-03-18 DIAGNOSIS — Z3492 Encounter for supervision of normal pregnancy, unspecified, second trimester: Secondary | ICD-10-CM

## 2015-03-18 DIAGNOSIS — Z369 Encounter for antenatal screening, unspecified: Secondary | ICD-10-CM

## 2015-03-18 LAB — POCT URINALYSIS DIPSTICK
GLUCOSE UA: NEGATIVE
Ketones, UA: NEGATIVE
Leukocytes, UA: NEGATIVE
NITRITE UA: NEGATIVE
PROTEIN UA: NEGATIVE
RBC UA: NEGATIVE

## 2015-03-18 NOTE — Progress Notes (Signed)
G2P0010 [redacted]w[redacted]d Estimated Date of Delivery: 09/26/15  Blood pressure 118/70, pulse 80, weight 226 lb (102.513 kg), last menstrual period 12/20/2014.   BP weight and urine results all reviewed and noted.  Please refer to the obstetrical flow sheet for the fundal height and fetal heart rate documentation:  Patient reports good fetal movement, denies any bleeding and no rupture of membranes symptoms or regular contractions. Patient is without complaints. All questions were answered.  Orders Placed This Encounter  Procedures  . POCT urinalysis dipstick    Plan:  Continued routine obstetrical care,   No Follow-up on file.

## 2015-03-18 NOTE — Addendum Note (Signed)
Addended by: Richardson Chiquito on: 03/18/2015 11:54 AM   Modules accepted: Orders

## 2015-03-18 NOTE — Progress Notes (Signed)
[place] 12+4 wks,single IUP pos fht 163bpm,normal ov's bilat,crl 61.32mm,unable to obtain NT because of fetal pos.,please have pt come back after appt.w/Dr. Despina Hidden for additional pictures.

## 2015-03-18 NOTE — Progress Notes (Signed)
[place] 12+4wks single IUP,pos fht 163,normal ov's bilat,NT 1.59mm,CRL 61.13mm,NB present,post pl gr 0

## 2015-03-18 NOTE — Progress Notes (Signed)
Pt denies any problems or concerns at this time.  

## 2015-03-22 LAB — MATERNAL SCREEN, INTEGRATED #1
CROWN RUMP LENGTH MAT SCREEN: 61.8 mm
GEST. AGE ON COLLECTION DATE: 12.6 wk
Maternal Age at EDD: 29.9 years
Nuchal Translucency (NT): 1.5 mm
Number of Fetuses: 1
PAPP-A VALUE: 424.6 ng/mL
WEIGHT: 226 [lb_av]

## 2015-04-15 ENCOUNTER — Ambulatory Visit (INDEPENDENT_AMBULATORY_CARE_PROVIDER_SITE_OTHER): Payer: 59 | Admitting: Obstetrics & Gynecology

## 2015-04-15 ENCOUNTER — Encounter: Payer: Self-pay | Admitting: Obstetrics & Gynecology

## 2015-04-15 VITALS — BP 112/78 | HR 90 | Wt 226.0 lb

## 2015-04-15 DIAGNOSIS — Z1389 Encounter for screening for other disorder: Secondary | ICD-10-CM

## 2015-04-15 DIAGNOSIS — Z3682 Encounter for antenatal screening for nuchal translucency: Secondary | ICD-10-CM

## 2015-04-15 DIAGNOSIS — Z3492 Encounter for supervision of normal pregnancy, unspecified, second trimester: Secondary | ICD-10-CM

## 2015-04-15 DIAGNOSIS — Z331 Pregnant state, incidental: Secondary | ICD-10-CM

## 2015-04-15 LAB — POCT URINALYSIS DIPSTICK
Blood, UA: NEGATIVE
Glucose, UA: NEGATIVE
Ketones, UA: NEGATIVE
Leukocytes, UA: NEGATIVE
NITRITE UA: NEGATIVE
PROTEIN UA: NEGATIVE

## 2015-04-15 NOTE — Progress Notes (Signed)
G2P0010 1673w4d Estimated Date of Delivery: 09/26/15  Blood pressure 112/78, pulse 90, weight 226 lb (102.513 kg), last menstrual period 12/20/2014.   BP weight and urine results all reviewed and noted.  Please refer to the obstetrical flow sheet for the fundal height and fetal heart rate documentation:  Patient reports good fetal movement, denies any bleeding and no rupture of membranes symptoms or regular contractions. Patient is without complaints. All questions were answered.  Orders Placed This Encounter  Procedures  . Maternal Screen, Integrated #2  . POCT urinalysis dipstick    Plan:  Continued routine obstetrical care, 2nd IT today  No Follow-up on file.

## 2015-04-18 LAB — MATERNAL SCREEN, INTEGRATED #2
AFP MARKER: 36.4 ng/mL
AFP MoM: 1.55
CROWN RUMP LENGTH: 61.8 mm
DIA MOM: 1.43
DIA Value: 194.5 pg/mL
Estriol, Unconjugated: 0.92 ng/mL
GESTATIONAL AGE: 16.6 wk
Gest. Age on Collection Date: 12.6 weeks
HCG MOM: 2.69
Maternal Age at EDD: 29.9 years
NUCHAL TRANSLUCENCY (NT): 1.5 mm
NUCHAL TRANSLUCENCY MOM: 0.96
Number of Fetuses: 1
PAPP-A MoM: 0.7
PAPP-A Value: 424.6 ng/mL
TEST RESULTS: NEGATIVE
Weight: 226 [lb_av]
Weight: 226 [lb_av]
hCG Value: 62.7 IU/mL
uE3 MoM: 1.08

## 2015-05-05 ENCOUNTER — Other Ambulatory Visit: Payer: Self-pay | Admitting: Obstetrics & Gynecology

## 2015-05-05 DIAGNOSIS — Z1389 Encounter for screening for other disorder: Secondary | ICD-10-CM

## 2015-05-06 ENCOUNTER — Ambulatory Visit (INDEPENDENT_AMBULATORY_CARE_PROVIDER_SITE_OTHER): Payer: 59

## 2015-05-06 ENCOUNTER — Ambulatory Visit (INDEPENDENT_AMBULATORY_CARE_PROVIDER_SITE_OTHER): Payer: 59 | Admitting: Obstetrics & Gynecology

## 2015-05-06 ENCOUNTER — Encounter: Payer: Self-pay | Admitting: Obstetrics & Gynecology

## 2015-05-06 VITALS — BP 100/70 | HR 84 | Wt 226.0 lb

## 2015-05-06 DIAGNOSIS — Z331 Pregnant state, incidental: Secondary | ICD-10-CM

## 2015-05-06 DIAGNOSIS — Z36 Encounter for antenatal screening of mother: Secondary | ICD-10-CM

## 2015-05-06 DIAGNOSIS — Z3492 Encounter for supervision of normal pregnancy, unspecified, second trimester: Secondary | ICD-10-CM

## 2015-05-06 DIAGNOSIS — Z1389 Encounter for screening for other disorder: Secondary | ICD-10-CM

## 2015-05-06 LAB — POCT URINALYSIS DIPSTICK
GLUCOSE UA: NEGATIVE
Ketones, UA: NEGATIVE
LEUKOCYTES UA: NEGATIVE
NITRITE UA: NEGATIVE
Protein, UA: NEGATIVE
RBC UA: NEGATIVE

## 2015-05-06 NOTE — Progress Notes (Signed)
G2P0010 6840w4d Estimated Date of Delivery: 09/26/15  Blood pressure 100/70, pulse 84, weight 226 lb (102.513 kg), last menstrual period 12/20/2014.   BP weight and urine results all reviewed and noted.  Please refer to the obstetrical flow sheet for the fundal height and fetal heart rate documentation:  Patient reports good fetal movement, denies any bleeding and no rupture of membranes symptoms or regular contractions. Patient is without complaints. All questions were answered.  Orders Placed This Encounter  Procedures  . POCT urinalysis dipstick    Plan:  Continued routine obstetrical care, sonogram is normal all anatomy is seen and is normal  Return in about 4 weeks (around 06/03/2015) for LROB.

## 2015-05-06 NOTE — Progress Notes (Signed)
US 19+4 wks,breech,post pl,cx 4.8cm,normal ov's bilat,fhr 157bpm,svp of fluid 5.2cm,anatomy complete,no obvious abn seen

## 2015-06-03 ENCOUNTER — Ambulatory Visit (INDEPENDENT_AMBULATORY_CARE_PROVIDER_SITE_OTHER): Payer: 59 | Admitting: Obstetrics and Gynecology

## 2015-06-03 ENCOUNTER — Encounter: Payer: Self-pay | Admitting: Obstetrics and Gynecology

## 2015-06-03 VITALS — BP 112/74 | HR 76 | Wt 227.0 lb

## 2015-06-03 DIAGNOSIS — Z3492 Encounter for supervision of normal pregnancy, unspecified, second trimester: Secondary | ICD-10-CM

## 2015-06-03 DIAGNOSIS — Z1389 Encounter for screening for other disorder: Secondary | ICD-10-CM

## 2015-06-03 DIAGNOSIS — Z331 Pregnant state, incidental: Secondary | ICD-10-CM

## 2015-06-03 LAB — POCT URINALYSIS DIPSTICK
Blood, UA: NEGATIVE
GLUCOSE UA: NEGATIVE
KETONES UA: NEGATIVE
LEUKOCYTES UA: NEGATIVE
Nitrite, UA: NEGATIVE
Protein, UA: NEGATIVE

## 2015-06-03 NOTE — Progress Notes (Signed)
Pt denies any problems or concerns at this time.  

## 2015-06-03 NOTE — Progress Notes (Signed)
Patient ID: Vanessa ReichertKatie F Rice, female   DOB: [birth year], 29 y.o.   MRN: [id number] G2P0010 6070w4d Estimated Date of Delivery: 09/26/15  Blood pressure 112/74, pulse 76, weight 227 lb (102.967 kg), last menstrual period 12/20/2014.   refer to the ob flow sheet for FH and FHR, also BP, Wt, Urine results: negative   Patient reports  + good fetal movement, denies any bleeding and no rupture of membranes symptoms or regular contractions. Patient complaints: No complaints at this time. FHR: 157 FH: 26cm  Questions were answered. Assessment: LROB G2P0010 @ 6470w4d   Plan:   Continued routine obstetrical care Child birth classes  F/u in 4 weeks for routine ob care   By signing my name below, I, Vanessa Rice, attest that this documentation has been prepared under the direction and in the presence of [name], MD. Electronically Signed: Marica OtterNusrat Rice, ED Scribe. 06/03/2015. 10:11 AM.   I personally performed the services described in this documentation, which was SCRIBED in my presence. The recorded information has been reviewed and considered accurate. It has been edited as necessary during review. Tilda BurrowFERGUSON,Vanessa Hyndman V, MD

## 2015-07-01 ENCOUNTER — Ambulatory Visit (INDEPENDENT_AMBULATORY_CARE_PROVIDER_SITE_OTHER): Payer: 59 | Admitting: Obstetrics & Gynecology

## 2015-07-01 ENCOUNTER — Other Ambulatory Visit: Payer: 59

## 2015-07-01 VITALS — BP 110/60 | HR 92 | Wt 233.0 lb

## 2015-07-01 DIAGNOSIS — Z3492 Encounter for supervision of normal pregnancy, unspecified, second trimester: Secondary | ICD-10-CM

## 2015-07-01 DIAGNOSIS — Z331 Pregnant state, incidental: Secondary | ICD-10-CM

## 2015-07-01 DIAGNOSIS — Z3482 Encounter for supervision of other normal pregnancy, second trimester: Secondary | ICD-10-CM

## 2015-07-01 DIAGNOSIS — Z3A28 28 weeks gestation of pregnancy: Secondary | ICD-10-CM

## 2015-07-01 DIAGNOSIS — Z1389 Encounter for screening for other disorder: Secondary | ICD-10-CM

## 2015-07-01 DIAGNOSIS — Z131 Encounter for screening for diabetes mellitus: Secondary | ICD-10-CM

## 2015-07-01 DIAGNOSIS — Z369 Encounter for antenatal screening, unspecified: Secondary | ICD-10-CM

## 2015-07-01 LAB — POCT URINALYSIS DIPSTICK
Glucose, UA: NEGATIVE
Ketones, UA: NEGATIVE
Leukocytes, UA: NEGATIVE
NITRITE UA: NEGATIVE
Protein, UA: NEGATIVE
RBC UA: NEGATIVE

## 2015-07-01 NOTE — Progress Notes (Signed)
G2P0010 5843w4d Estimated Date of Delivery: 09/26/15  Blood pressure 110/60, pulse 92, weight 233 lb (105.688 kg), last menstrual period 12/20/2014.   BP weight and urine results all reviewed and noted.  Please refer to the obstetrical flow sheet for the fundal height and fetal heart rate documentation:  Patient reports good fetal movement, denies any bleeding and no rupture of membranes symptoms or regular contractions. Patient is without complaints. All questions were answered.  Orders Placed This Encounter  Procedures  . POCT urinalysis dipstick    Plan:  Continued routine obstetrical care,   Return in about 3 weeks (around 07/22/2015) for LROB.

## 2015-07-02 LAB — CBC
HEMATOCRIT: 36.5 % (ref 34.0–46.6)
Hemoglobin: 12 g/dL (ref 11.1–15.9)
MCH: 31 pg (ref 26.6–33.0)
MCHC: 32.9 g/dL (ref 31.5–35.7)
MCV: 94 fL (ref 79–97)
PLATELETS: 256 10*3/uL (ref 150–379)
RBC: 3.87 x10E6/uL (ref 3.77–5.28)
RDW: 13.1 % (ref 12.3–15.4)
WBC: 12.3 10*3/uL — AB (ref 3.4–10.8)

## 2015-07-02 LAB — GLUCOSE TOLERANCE, 2 HOURS W/ 1HR
GLUCOSE, FASTING: 87 mg/dL (ref 65–91)
Glucose, 1 hour: 167 mg/dL (ref 65–179)
Glucose, 2 hour: 130 mg/dL (ref 65–152)

## 2015-07-02 LAB — HIV ANTIBODY (ROUTINE TESTING W REFLEX): HIV SCREEN 4TH GENERATION: NONREACTIVE

## 2015-07-02 LAB — RPR: RPR: NONREACTIVE

## 2015-07-02 LAB — ANTIBODY SCREEN: Antibody Screen: NEGATIVE

## 2015-07-12 DIAGNOSIS — H5213 Myopia, bilateral: Secondary | ICD-10-CM | POA: Diagnosis not present

## 2015-07-22 ENCOUNTER — Ambulatory Visit (INDEPENDENT_AMBULATORY_CARE_PROVIDER_SITE_OTHER): Payer: 59 | Admitting: Obstetrics & Gynecology

## 2015-07-22 VITALS — BP 130/82 | HR 86 | Wt 235.0 lb

## 2015-07-22 DIAGNOSIS — Z331 Pregnant state, incidental: Secondary | ICD-10-CM

## 2015-07-22 DIAGNOSIS — Z3A30 30 weeks gestation of pregnancy: Secondary | ICD-10-CM

## 2015-07-22 DIAGNOSIS — Z1389 Encounter for screening for other disorder: Secondary | ICD-10-CM

## 2015-07-22 DIAGNOSIS — Z3493 Encounter for supervision of normal pregnancy, unspecified, third trimester: Secondary | ICD-10-CM

## 2015-07-22 LAB — POCT URINALYSIS DIPSTICK
GLUCOSE UA: NEGATIVE
KETONES UA: NEGATIVE
NITRITE UA: NEGATIVE
Protein, UA: NEGATIVE

## 2015-07-22 NOTE — Progress Notes (Signed)
G2P0010 [redacted]w[redacted]d Estimated Date of Delivery: 09/26/15  Blood pressure 130/82, pulse 86, weight 235 lb (106.595 kg), last menstrual period 12/20/2014.   BP weight and urine results all reviewed and noted.  Please refer to the obstetrical flow sheet for the fundal height and fetal heart rate documentation:  Patient reports good fetal movement, denies any bleeding and no rupture of membranes symptoms or regular contractions. Patient is without complaints. All questions were answered.  Orders Placed This Encounter  Procedures  . POCT urinalysis dipstick    Plan:  Continued routine obstetrical care,   No Follow-up on file.

## 2015-08-05 ENCOUNTER — Ambulatory Visit (INDEPENDENT_AMBULATORY_CARE_PROVIDER_SITE_OTHER): Payer: 59 | Admitting: Obstetrics & Gynecology

## 2015-08-05 VITALS — BP 130/80 | HR 78 | Wt 238.0 lb

## 2015-08-05 DIAGNOSIS — Z331 Pregnant state, incidental: Secondary | ICD-10-CM

## 2015-08-05 DIAGNOSIS — Z1389 Encounter for screening for other disorder: Secondary | ICD-10-CM

## 2015-08-05 DIAGNOSIS — Z3493 Encounter for supervision of normal pregnancy, unspecified, third trimester: Secondary | ICD-10-CM

## 2015-08-05 DIAGNOSIS — Z3A33 33 weeks gestation of pregnancy: Secondary | ICD-10-CM

## 2015-08-05 LAB — POCT URINALYSIS DIPSTICK
Blood, UA: 1
Glucose, UA: NEGATIVE
KETONES UA: NEGATIVE
NITRITE UA: NEGATIVE
PROTEIN UA: NEGATIVE

## 2015-08-05 NOTE — Progress Notes (Signed)
G2P0010 [redacted]w[redacted]d Estimated Date of Delivery: 09/26/15  Blood pressure 130/80, pulse 78, weight 238 lb (107.956 kg), last menstrual period 12/20/2014.   BP weight and urine results all reviewed and noted.  Please refer to the obstetrical flow sheet for the fundal height and fetal heart rate documentation:  Patient reports good fetal movement, denies any bleeding and no rupture of membranes symptoms or regular contractions. Patient is without complaints. All questions were answered.  Orders Placed This Encounter  Procedures  . POCT urinalysis dipstick    Plan:  Continued routine obstetrical care,   Return in about 2 weeks (around 08/19/2015) for LROB.

## 2015-08-19 ENCOUNTER — Ambulatory Visit (INDEPENDENT_AMBULATORY_CARE_PROVIDER_SITE_OTHER): Payer: 59 | Admitting: Obstetrics and Gynecology

## 2015-08-19 ENCOUNTER — Encounter: Payer: Self-pay | Admitting: Obstetrics and Gynecology

## 2015-08-19 VITALS — BP 120/80 | HR 73

## 2015-08-19 DIAGNOSIS — Z331 Pregnant state, incidental: Secondary | ICD-10-CM

## 2015-08-19 DIAGNOSIS — Z3A35 35 weeks gestation of pregnancy: Secondary | ICD-10-CM

## 2015-08-19 DIAGNOSIS — Z1389 Encounter for screening for other disorder: Secondary | ICD-10-CM

## 2015-08-19 DIAGNOSIS — Z3493 Encounter for supervision of normal pregnancy, unspecified, third trimester: Secondary | ICD-10-CM

## 2015-08-19 DIAGNOSIS — Z3483 Encounter for supervision of other normal pregnancy, third trimester: Secondary | ICD-10-CM

## 2015-08-19 LAB — POCT URINALYSIS DIPSTICK
Glucose, UA: NEGATIVE
Ketones, UA: NEGATIVE
Leukocytes, UA: NEGATIVE
NITRITE UA: NEGATIVE
RBC UA: NEGATIVE

## 2015-08-19 NOTE — Progress Notes (Signed)
Patient ID: Vanessa Rice, female   DOB: 1986/02/22, 30 y.o.   MRN: [id number]  G2P0010 [redacted]w[redacted]d Estimated Date of Delivery: 09/26/15  Blood pressure 120/80, pulse 73, last menstrual period 12/20/2014.   refer to the ob flow sheet for FH and FHR, also BP, Wt, Urine results:notable for trace protein  Patient reports  + good fetal movement, denies any bleeding and no rupture of membranes symptoms or regular contractions. Patient complaints: None. Patient states she has just finished childbirth classes.   FHR: 136 bpm FH: 38 cm  Questions were answered. Assessment: LROB G2P0010 @ [redacted]w[redacted]d   Plan:  Continued routine obstetrical care,   F/u in 2 weeks for pnx care, GBS   By signing my name below, I, Ronney Lion, attest that this documentation has been prepared under the direction and in the presence of Tilda Burrow, MD. Electronically Signed: Ronney Lion, ED Scribe. 08/19/2015. 10:58 AM.  I personally performed the services described in this documentation, which was SCRIBED in my presence. The recorded information has been reviewed and considered accurate. It has been edited as necessary during review. Tilda Burrow, MD

## 2015-08-19 NOTE — Progress Notes (Signed)
Pt denies any problems or concerns at this time.  

## 2015-09-02 ENCOUNTER — Ambulatory Visit (INDEPENDENT_AMBULATORY_CARE_PROVIDER_SITE_OTHER): Payer: 59 | Admitting: Obstetrics & Gynecology

## 2015-09-02 ENCOUNTER — Encounter: Payer: Self-pay | Admitting: Obstetrics & Gynecology

## 2015-09-02 VITALS — BP 120/90 | HR 80 | Wt 248.0 lb

## 2015-09-02 DIAGNOSIS — Z331 Pregnant state, incidental: Secondary | ICD-10-CM

## 2015-09-02 DIAGNOSIS — Z36 Encounter for antenatal screening of mother: Secondary | ICD-10-CM | POA: Diagnosis not present

## 2015-09-02 DIAGNOSIS — Z118 Encounter for screening for other infectious and parasitic diseases: Secondary | ICD-10-CM | POA: Diagnosis not present

## 2015-09-02 DIAGNOSIS — Z1159 Encounter for screening for other viral diseases: Secondary | ICD-10-CM | POA: Diagnosis not present

## 2015-09-02 DIAGNOSIS — Z3493 Encounter for supervision of normal pregnancy, unspecified, third trimester: Secondary | ICD-10-CM

## 2015-09-02 DIAGNOSIS — Z1389 Encounter for screening for other disorder: Secondary | ICD-10-CM

## 2015-09-02 DIAGNOSIS — Z3685 Encounter for antenatal screening for Streptococcus B: Secondary | ICD-10-CM

## 2015-09-02 DIAGNOSIS — IMO0002 Reserved for concepts with insufficient information to code with codable children: Secondary | ICD-10-CM

## 2015-09-02 LAB — POCT URINALYSIS DIPSTICK
Blood, UA: NEGATIVE
GLUCOSE UA: NEGATIVE
KETONES UA: NEGATIVE
LEUKOCYTES UA: NEGATIVE
Nitrite, UA: NEGATIVE

## 2015-09-02 NOTE — Progress Notes (Signed)
G2P0010 8424w4d Estimated Date of Delivery: 09/26/15  Blood pressure 120/90, pulse 80, weight 248 lb (112.492 kg), last menstrual period 12/20/2014.   BP weight and urine results all reviewed and noted.  Please refer to the obstetrical flow sheet for the fundal height and fetal heart rate documentation:  Patient reports good fetal movement, denies any bleeding and no rupture of membranes symptoms or regular contractions. Patient is without complaints. All questions were answered.  Orders Placed This Encounter  Procedures  . POCT urinalysis dipstick    Plan:  Continued routine obstetrical care, GBS done, sonogram for EFW at next visit  BP is creeping up a bit, pt is taking her BP at work, will start to record, she says her BP is better than what we get here usually  No Follow-up on file.  Orders Placed This Encounter  Procedures  . GC/Chlamydia Probe Amp  . Strep Gp B NAA  . US OB Follow Up  . POCT urinalysis dipstick

## 2015-09-04 LAB — STREP GP B NAA: STREP GROUP B AG: POSITIVE — AB

## 2015-09-06 LAB — GC/CHLAMYDIA PROBE AMP
Chlamydia trachomatis, NAA: NEGATIVE
Neisseria gonorrhoeae by PCR: NEGATIVE

## 2015-09-07 LAB — OB RESULTS CONSOLE GBS: STREP GROUP B AG: POSITIVE

## 2015-09-08 ENCOUNTER — Encounter: Payer: Self-pay | Admitting: Advanced Practice Midwife

## 2015-09-08 ENCOUNTER — Ambulatory Visit (INDEPENDENT_AMBULATORY_CARE_PROVIDER_SITE_OTHER): Payer: 59

## 2015-09-08 ENCOUNTER — Ambulatory Visit (INDEPENDENT_AMBULATORY_CARE_PROVIDER_SITE_OTHER): Payer: 59 | Admitting: Advanced Practice Midwife

## 2015-09-08 VITALS — BP 140/100 | HR 88 | Wt 252.0 lb

## 2015-09-08 DIAGNOSIS — Z3A38 38 weeks gestation of pregnancy: Secondary | ICD-10-CM

## 2015-09-08 DIAGNOSIS — IMO0002 Reserved for concepts with insufficient information to code with codable children: Secondary | ICD-10-CM

## 2015-09-08 DIAGNOSIS — Z331 Pregnant state, incidental: Secondary | ICD-10-CM

## 2015-09-08 DIAGNOSIS — Z3483 Encounter for supervision of other normal pregnancy, third trimester: Secondary | ICD-10-CM

## 2015-09-08 DIAGNOSIS — O1403 Mild to moderate pre-eclampsia, third trimester: Secondary | ICD-10-CM | POA: Diagnosis not present

## 2015-09-08 DIAGNOSIS — R809 Proteinuria, unspecified: Secondary | ICD-10-CM | POA: Diagnosis not present

## 2015-09-08 DIAGNOSIS — Z3493 Encounter for supervision of normal pregnancy, unspecified, third trimester: Secondary | ICD-10-CM

## 2015-09-08 DIAGNOSIS — Z1389 Encounter for screening for other disorder: Secondary | ICD-10-CM

## 2015-09-08 DIAGNOSIS — O26843 Uterine size-date discrepancy, third trimester: Secondary | ICD-10-CM

## 2015-09-08 LAB — POCT URINALYSIS DIPSTICK
Glucose, UA: NEGATIVE
KETONES UA: NEGATIVE
Leukocytes, UA: NEGATIVE
Nitrite, UA: NEGATIVE
PROTEIN UA: 1
RBC UA: NEGATIVE

## 2015-09-08 NOTE — Progress Notes (Signed)
G2P0010 3886w3d Estimated Date of Delivery: 09/26/15  Blood pressure 140/100, pulse 88, weight 252 lb (114.306 kg), last menstrual period 12/20/2014.   BP weight and urine results all reviewed and noted.  No HA, viision changes, RUQ pain  Please refer to the obstetrical flow sheet for the fundal height and fetal heart rate documentation:US for size>dates  US 37+3wks,cephalic,post pl gr 2,fhr 143 bpm,afi 15cm,efw 3261 g 60%           Patient reports good fetal movement, denies any bleeding and no rupture of membranes symptoms or regular contractions. Patient is without complaints. All questions were answered.  Orders Placed This Encounter  Procedures  . Protein / creatinine ratio, urine  . CBC  . Comprehensive metabolic panel  . POCT urinalysis dipstick    Plan:  BP elevated.  BPs at work (Dr. Lita MainsHaines office) 130/78, 155/88, 160/88.   Will do preX labs, see back tomorrow. Suspect will get dx of GHTN, possilbe preeclampsia  Orders Placed This Encounter  Procedures  . Protein / creatinine ratio, urine  . CBC  . Comprehensive metabolic panel  . POCT urinalysis dipstick     Return in about 1 day (around 09/09/2015) for LROB BP check.

## 2015-09-08 NOTE — Progress Notes (Signed)
US 37+3wks,cephalic,post pl gr 2,fhr 143 bpm,afi 15cm,efw 3261 g 60%

## 2015-09-08 NOTE — Progress Notes (Signed)
Pt denies any problems at this time.  

## 2015-09-09 ENCOUNTER — Encounter (HOSPITAL_COMMUNITY): Payer: Self-pay | Admitting: *Deleted

## 2015-09-09 ENCOUNTER — Encounter (HOSPITAL_COMMUNITY): Payer: Self-pay | Admitting: Anesthesiology

## 2015-09-09 ENCOUNTER — Encounter: Payer: Self-pay | Admitting: Obstetrics and Gynecology

## 2015-09-09 ENCOUNTER — Ambulatory Visit (INDEPENDENT_AMBULATORY_CARE_PROVIDER_SITE_OTHER): Payer: 59 | Admitting: Obstetrics and Gynecology

## 2015-09-09 ENCOUNTER — Inpatient Hospital Stay (HOSPITAL_COMMUNITY)
Admission: AD | Admit: 2015-09-09 | Discharge: 2015-09-15 | DRG: 765 | Disposition: A | Discharge status: Home / Self Care | Payer: 59 | Source: Ambulatory Visit | Attending: Obstetrics & Gynecology | Admitting: Obstetrics & Gynecology

## 2015-09-09 VITALS — BP 156/104 | HR 87 | Wt 251.0 lb

## 2015-09-09 DIAGNOSIS — Z833 Family history of diabetes mellitus: Secondary | ICD-10-CM | POA: Diagnosis not present

## 2015-09-09 DIAGNOSIS — D62 Acute posthemorrhagic anemia: Secondary | ICD-10-CM | POA: Diagnosis present

## 2015-09-09 DIAGNOSIS — Z331 Pregnant state, incidental: Secondary | ICD-10-CM

## 2015-09-09 DIAGNOSIS — Z8249 Family history of ischemic heart disease and other diseases of the circulatory system: Secondary | ICD-10-CM

## 2015-09-09 DIAGNOSIS — O1400 Mild to moderate pre-eclampsia, unspecified trimester: Secondary | ICD-10-CM | POA: Diagnosis present

## 2015-09-09 DIAGNOSIS — Z3A38 38 weeks gestation of pregnancy: Secondary | ICD-10-CM

## 2015-09-09 DIAGNOSIS — Z3493 Encounter for supervision of normal pregnancy, unspecified, third trimester: Secondary | ICD-10-CM

## 2015-09-09 DIAGNOSIS — Z3A37 37 weeks gestation of pregnancy: Secondary | ICD-10-CM

## 2015-09-09 DIAGNOSIS — O09299 Supervision of pregnancy with other poor reproductive or obstetric history, unspecified trimester: Secondary | ICD-10-CM | POA: Insufficient documentation

## 2015-09-09 DIAGNOSIS — Z98891 History of uterine scar from previous surgery: Secondary | ICD-10-CM

## 2015-09-09 DIAGNOSIS — O1404 Mild to moderate pre-eclampsia, complicating childbirth: Secondary | ICD-10-CM | POA: Diagnosis not present

## 2015-09-09 DIAGNOSIS — Z803 Family history of malignant neoplasm of breast: Secondary | ICD-10-CM

## 2015-09-09 DIAGNOSIS — O1403 Mild to moderate pre-eclampsia, third trimester: Secondary | ICD-10-CM | POA: Diagnosis not present

## 2015-09-09 DIAGNOSIS — Z3490 Encounter for supervision of normal pregnancy, unspecified, unspecified trimester: Secondary | ICD-10-CM

## 2015-09-09 DIAGNOSIS — Z1389 Encounter for screening for other disorder: Secondary | ICD-10-CM

## 2015-09-09 DIAGNOSIS — O99824 Streptococcus B carrier state complicating childbirth: Secondary | ICD-10-CM | POA: Diagnosis present

## 2015-09-09 DIAGNOSIS — Z6841 Body Mass Index (BMI) 40.0 and over, adult: Secondary | ICD-10-CM

## 2015-09-09 DIAGNOSIS — O9081 Anemia of the puerperium: Secondary | ICD-10-CM | POA: Diagnosis not present

## 2015-09-09 DIAGNOSIS — O99214 Obesity complicating childbirth: Secondary | ICD-10-CM | POA: Diagnosis present

## 2015-09-09 HISTORY — DX: Mild to moderate pre-eclampsia, unspecified trimester: O14.00

## 2015-09-09 HISTORY — DX: Acute posthemorrhagic anemia: D62

## 2015-09-09 LAB — POCT URINALYSIS DIPSTICK
GLUCOSE UA: NEGATIVE
Ketones, UA: NEGATIVE
Leukocytes, UA: NEGATIVE
NITRITE UA: NEGATIVE
Protein, UA: 3
RBC UA: NEGATIVE

## 2015-09-09 LAB — COMPREHENSIVE METABOLIC PANEL
A/G RATIO: 1.4 (ref 1.1–2.5)
ALBUMIN: 2.8 g/dL — AB (ref 3.5–5.0)
ALT: 18 IU/L (ref 0–32)
ALT: 23 U/L (ref 14–54)
AST: 20 IU/L (ref 0–40)
AST: 31 U/L (ref 15–41)
Albumin: 3.2 g/dL — ABNORMAL LOW (ref 3.5–5.5)
Alkaline Phosphatase: 117 IU/L (ref 39–117)
Alkaline Phosphatase: 114 U/L (ref 38–126)
Anion gap: 5 (ref 5–15)
BUN/Creatinine Ratio: 22 — ABNORMAL HIGH (ref 8–20)
BUN: 11 mg/dL (ref 6–20)
BUN: 11 mg/dL (ref 6–20)
Bilirubin Total: <0.2 mg/dL (ref 0.0–1.2)
CALCIUM: 8.8 mg/dL (ref 8.7–10.2)
CHLORIDE: 109 mmol/L (ref 101–111)
CO2: 18 mmol/L (ref 18–29)
CO2: 23 mmol/L (ref 22–32)
CREATININE: 0.53 mg/dL (ref 0.44–1.00)
Calcium: 8.7 mg/dL — ABNORMAL LOW (ref 8.9–10.3)
Chloride: 103 mmol/L (ref 96–106)
Creatinine, Ser: 0.49 mg/dL — ABNORMAL LOW (ref 0.57–1.00)
GFR calc Af Amer: 152 mL/min/{1.73_m2} (ref >59)
GFR calc Af Amer: >60 mL/min (ref >60)
GFR, EST NON AFRICAN AMERICAN: 132 mL/min/{1.73_m2} (ref >59)
GLUCOSE: 74 mg/dL (ref 65–99)
GLUCOSE: 78 mg/dL (ref 65–99)
Globulin, Total: 2.3 g/dL (ref 1.5–4.5)
POTASSIUM: 4.6 mmol/L (ref 3.5–5.2)
POTASSIUM: 4.5 mmol/L (ref 3.5–5.1)
Sodium: 140 mmol/L (ref 134–144)
Sodium: 137 mmol/L (ref 135–145)
Total Bilirubin: 0.4 mg/dL (ref 0.3–1.2)
Total Protein: 5.5 g/dL — ABNORMAL LOW (ref 6.0–8.5)
Total Protein: 5.7 g/dL — ABNORMAL LOW (ref 6.5–8.1)

## 2015-09-09 LAB — CBC
HEMATOCRIT: 35.1 % — AB (ref 36.0–46.0)
HEMOGLOBIN: 12 g/dL (ref 12.0–15.0)
Hematocrit: 35.5 % (ref 34.0–46.6)
Hemoglobin: 11.8 g/dL (ref 11.1–15.9)
MCH: 30.3 pg (ref 26.6–33.0)
MCH: 31 pg (ref 26.0–34.0)
MCHC: 33.2 g/dL (ref 31.5–35.7)
MCHC: 34.2 g/dL (ref 30.0–36.0)
MCV: 91 fL (ref 79–97)
MCV: 90.7 fL (ref 78.0–100.0)
PLATELETS: 200 10*3/uL (ref 150–379)
Platelets: 192 10*3/uL (ref 150–400)
RBC: 3.9 x10E6/uL (ref 3.77–5.28)
RBC: 3.87 MIL/uL (ref 3.87–5.11)
RDW: 13.9 % (ref 12.3–15.4)
RDW: 13.6 % (ref 11.5–15.5)
WBC: 9.9 10*3/uL (ref 3.4–10.8)
WBC: 10.7 10*3/uL — AB (ref 4.0–10.5)

## 2015-09-09 LAB — ABO/RH: ABO/RH(D): A POS

## 2015-09-09 LAB — TYPE AND SCREEN
ABO/RH(D): A POS
Antibody Screen: NEGATIVE

## 2015-09-09 LAB — PROTEIN / CREATININE RATIO, URINE
CREATININE, UR: 38.4 mg/dL
PROTEIN UR: 47.5 mg/dL
PROTEIN/CREAT RATIO: 1237 mg/g{creat} — AB (ref 0–200)

## 2015-09-09 MED ORDER — EPHEDRINE 5 MG/ML INJ: 10.0000 mg | INTRAVENOUS | Status: DC | PRN

## 2015-09-09 MED ORDER — LIDOCAINE HCL (PF) 1 % IJ SOLN: 30.0000 mL | INTRAMUSCULAR | Status: DC | PRN

## 2015-09-09 MED ORDER — FENTANYL CITRATE (PF) 100 MCG/2ML IJ SOLN: 100.0000 ug | INTRAMUSCULAR | Status: DC | PRN

## 2015-09-09 MED ORDER — DIPHENHYDRAMINE HCL 50 MG/ML IJ SOLN: 12.5000 mg | INTRAMUSCULAR | Status: DC | PRN

## 2015-09-09 MED ORDER — ONDANSETRON HCL 4 MG/2ML IJ SOLN: 4.0000 mg | Freq: Four times a day (QID) | INTRAMUSCULAR | Status: DC | PRN

## 2015-09-09 MED ORDER — PENICILLIN G POTASSIUM 5000000 UNITS IJ SOLR
2.5000 10*6.[IU] | INTRAMUSCULAR | Status: DC
  Administered 2015-09-10 – 2015-09-12 (×13): 2.5 10*6.[IU] via INTRAVENOUS
  Filled 2015-09-09 (×16): qty 2.5

## 2015-09-09 MED ORDER — LACTATED RINGERS IV SOLN: 500.0000 mL | Freq: Once | INTRAVENOUS | Status: DC

## 2015-09-09 MED ORDER — LACTATED RINGERS IV SOLN
INTRAVENOUS | Status: DC
  Administered 2015-09-10 – 2015-09-12 (×7): via INTRAVENOUS

## 2015-09-09 MED ORDER — PENICILLIN G POTASSIUM 5000000 UNITS IJ SOLR
5.0000 10*6.[IU] | Freq: Once | INTRAVENOUS | Status: AC
  Administered 2015-09-09: 5 10*6.[IU] via INTRAVENOUS
  Filled 2015-09-09: qty 5

## 2015-09-09 MED ORDER — OXYTOCIN BOLUS FROM INFUSION: 500.0000 mL | INTRAVENOUS | Status: DC

## 2015-09-09 MED ORDER — PHENYLEPHRINE 40 MCG/ML (10ML) SYRINGE FOR IV PUSH (FOR BLOOD PRESSURE SUPPORT)
80.0000 ug | PREFILLED_SYRINGE | INTRAVENOUS | Status: DC | PRN
  Filled 2015-09-09: qty 20

## 2015-09-09 MED ORDER — TERBUTALINE SULFATE 1 MG/ML IJ SOLN: 0.2500 mg | Freq: Once | INTRAMUSCULAR | Status: DC | PRN

## 2015-09-09 MED ORDER — LACTATED RINGERS IV SOLN
500.0000 mL | INTRAVENOUS | Status: DC | PRN
  Administered 2015-09-10: 200 mL via INTRAVENOUS

## 2015-09-09 MED ORDER — ACETAMINOPHEN 325 MG PO TABS: 650.0000 mg | ORAL_TABLET | ORAL | Status: DC | PRN

## 2015-09-09 MED ORDER — OXYTOCIN 10 UNIT/ML IJ SOLN: 2.5000 [IU]/h | INTRAVENOUS | Status: DC

## 2015-09-09 MED ORDER — OXYTOCIN 10 UNIT/ML IJ SOLN: 1.0000 m[IU]/min | INTRAVENOUS | Status: DC

## 2015-09-09 MED ORDER — CITRIC ACID-SODIUM CITRATE 334-500 MG/5ML PO SOLN
30.0000 mL | ORAL | Status: DC | PRN
  Administered 2015-09-12: 30 mL via ORAL
  Filled 2015-09-09: qty 15

## 2015-09-09 MED ORDER — FENTANYL 2.5 MCG/ML BUPIVACAINE 1/10 % EPIDURAL INFUSION (WH - ANES)
14.0000 mL/h | INTRAMUSCULAR | Status: DC | PRN
  Administered 2015-09-11: 12 mL/h via EPIDURAL
  Administered 2015-09-12 (×2): 14 mL/h via EPIDURAL
  Filled 2015-09-09 (×4): qty 125

## 2015-09-09 MED ORDER — PHENYLEPHRINE 40 MCG/ML (10ML) SYRINGE FOR IV PUSH (FOR BLOOD PRESSURE SUPPORT): 80.0000 ug | PREFILLED_SYRINGE | INTRAVENOUS | Status: DC | PRN

## 2015-09-09 MED ORDER — MISOPROSTOL 25 MCG QUARTER TABLET
25.0000 ug | ORAL_TABLET | ORAL | Status: DC | PRN
  Administered 2015-09-09 – 2015-09-10 (×4): 25 ug via VAGINAL
  Filled 2015-09-09 (×4): qty 0.25

## 2015-09-09 NOTE — H&P (Signed)
LABOR AND DELIVERY ADMISSION HISTORY AND PHYSICAL NOTE  Vanessa ReichertKatie F Rice is a 30 y.o. female G2P0010 with IUP at 3932w4d by LMP c/w 7wk US presenting for IOL 2/2 mild preeclampsia.  The patient was seen in outpatient setting today.  Was found to be hypertensive and have proteinuria with symptoms of headache and bilateral 2+ PE of tibia.  During examination c/o of only mild headache.  Denies RUQ pain, photopsia, scotomata.    She reports positive fetal movement. She denies leakage of fluid or vaginal bleeding.  US 3/9 showed EFW 3261g, 60%.  Afi 15cm.  Cephalic.  Prenatal History/Complications: Complicated by HTN and proteinuria.  Pr/Cr ratio of 1.2 earlier this week. GBS+    Past Medical History: Past Medical History  Diagnosis Date  . Bronchial spasms   . Medical history non-contributory   . Pregnant 01/28/2015  . Mild preeclampsia 09/09/2015    Past Surgical History: Past Surgical History  Procedure Laterality Date  . Wisdom tooth extraction  04/2011  . No past surgeries      Obstetrical History: OB History    Gravida Para Term Preterm AB TAB SAB Ectopic Multiple Living   2 0 0 0 1 0 1 0 0 0       Social History: Social History   Social History  . Marital Status: Married    Spouse Name: N/A  . Number of Children: N/A  . Years of Education: N/A   Social History Main Topics  . Smoking status: Never Smoker   . Smokeless tobacco: Never Used  . Alcohol Use: No  . Drug Use: No  . Sexual Activity:    Partners: Male    Birth Control/ Protection: None   Other Topics Concern  . None   Social History Narrative    Family History: Family History  Problem Relation Age of Onset  . Hypertension Mother   . Hyperlipidemia Mother   . Polycystic ovary syndrome Sister   . Diabetes Maternal Grandmother   . Thyroid disease Maternal Grandmother   . Diabetes Maternal Grandfather   . Breast cancer Paternal Grandmother   . Breast cancer Maternal Aunt 52  . Cancer Maternal Aunt  51    breast  . Other Maternal Aunt     Platelet grandular defect  . Cancer Paternal Aunt     breast cancer    Allergies: No Known Allergies  Prescriptions prior to admission  Medication Sig Dispense Refill Last Dose  . Prenatal Multivit-Min-Fe-FA (PRE-NATAL PO) Take by mouth daily.   Taking     Review of Systems   All systems reviewed and negative except as stated in HPI  Blood pressure 146/90, pulse 79, height 5\' 3"  (1.6 m), weight 251 lb (113.853 kg), last menstrual period 12/20/2014. General appearance: alert and cooperative Lungs: clear to auscultation bilaterally Heart: regular rate and rhythm Abdomen: soft, non-tender; bowel sounds normal Extremities: 2+ PE of tibias bilaterally.   Presentation: cephalic confirmed on US 3/9 Fetal monitoring: Cat 1 tracing Uterine activity: Braxton-hicks contractions Dilation: Closed Effacement (%): 40 Station: -3 Exam by:: Shakaria Raphael   Prenatal labs: ABO, Rh: --/--/A POS (03/10 1825) Antibody: PENDING (03/10 1825) Rubella: !Error! RPR: Non Reactive (12/30 0858)  HBsAg: Negative (08/22 1121)  HIV: Non Reactive (12/30 0858)  GBS: Positive (03/08 0000)  1 hr Glucola:  Genetic screening:  negative Anatomy US: Nml female  Prenatal Transfer Tool  Maternal Diabetes: No Genetic Screening: Normal Maternal Ultrasounds/Referrals: Normal Fetal Ultrasounds or other Referrals:  None Maternal Substance  Abuse:  No Significant Maternal Medications:  None Significant Maternal Lab Results: Lab values include: Group B Strep positive  Results for orders placed or performed during the hospital encounter of 09/09/15 (from the past 24 hour(s))  CBC   Collection Time: 09/09/15  6:25 PM  Result Value Ref Range   WBC 10.7 (H) 4.0 - 10.5 K/uL   RBC 3.87 3.87 - 5.11 MIL/uL   Hemoglobin 12.0 12.0 - 15.0 g/dL   HCT 13.0 (L) 86.5 - 78.4 %   MCV 90.7 78.0 - 100.0 fL   MCH 31.0 26.0 - 34.0 pg   MCHC 34.2 30.0 - 36.0 g/dL   RDW 69.6 29.5 - 28.4 %    Platelets 192 150 - 400 K/uL  Comprehensive metabolic panel   Collection Time: 09/09/15  6:25 PM  Result Value Ref Range   Sodium 137 135 - 145 mmol/L   Potassium 4.5 3.5 - 5.1 mmol/L   Chloride 109 101 - 111 mmol/L   CO2 23 22 - 32 mmol/L   Glucose, Bld 78 65 - 99 mg/dL   BUN 11 6 - 20 mg/dL   Creatinine, Ser 1.32 0.44 - 1.00 mg/dL   Calcium 8.7 (L) 8.9 - 10.3 mg/dL   Total Protein 5.7 (L) 6.5 - 8.1 g/dL   Albumin 2.8 (L) 3.5 - 5.0 g/dL   AST 31 15 - 41 U/L   ALT 23 14 - 54 U/L   Alkaline Phosphatase 114 38 - 126 U/L   Total Bilirubin 0.4 0.3 - 1.2 mg/dL   GFR calc non Af Amer >60 >60 mL/min   GFR calc Af Amer >60 >60 mL/min   Anion gap 5 5 - 15  Type and screen   Collection Time: 09/09/15  6:25 PM  Result Value Ref Range   ABO/RH(D) A POS    Antibody Screen PENDING    Sample Expiration 09/12/2015   Results for orders placed or performed in visit on 09/09/15 (from the past 24 hour(s))  POCT urinalysis dipstick   Collection Time: 09/09/15 12:10 PM  Result Value Ref Range   Color, UA     Clarity, UA     Glucose, UA neg    Bilirubin, UA     Ketones, UA neg    Spec Grav, UA     Blood, UA neg    pH, UA     Protein, UA 3    Urobilinogen, UA     Nitrite, UA neg    Leukocytes, UA Negative Negative    Patient Active Problem List   Diagnosis Date Noted  . Mild preeclampsia 09/09/2015  . Supervision of normal pregnancy 01/28/2015  . Complete miscarriage 09/13/2014    Assessment: Vanessa Rice is a 30 y.o. G2P0010 at [redacted]w[redacted]d here for IOL 2/2 mild pre-eclamsia.    #Mild Pre-eclampsia:  Will monitor bp.  Pre-E labs to follow.  Labetalol prn for BP > 160/110.  Consider Mg. #Labor: Augmentation of labor.  Cytotec given at 1900.  Foley bulb attempted; will retry later.   #Pain: Will start with IV fentanyl.  Plan for epidural #FWB: Category 1 tracing #ID:  GBS positive.  PCN ordered. #MOF: Breas #MOC:  Undecided #Circ:  Inpatient  Nolon Bussing [place] 09/09/2015, 7:11  PM   OB FELLOW HISTORY AND PHYSICAL ATTESTATION  I have seen and examined this patient; I agree with above documentation in the resident's note.    Vanessa Rice 09/09/2015, 7:49 PM

## 2015-09-09 NOTE — Progress Notes (Signed)
G2P0010 2262w4d Estimated Date of Delivery: 09/26/15  Blood pressure 156/104, pulse 87, weight 251 lb (113.853 kg), last menstrual period 12/20/2014.   BP weight and urine results all reviewed and noted as elevated  Please refer to the obstetrical flow sheet for the fundal height and fetal heart rate documentation:  Patient reports good fetal movement, denies any bleeding and no rupture of membranes symptoms or regular contractions. Patient is without complaints. All questions were answered.  Edema: 3+ pitting edema into the bilateral knees  Orders Placed This Encounter  Procedures  . POCT urinalysis dipstick   Physical Examination:  General appearance - alert, well appearing, and in no distress and oriented to person, place, and time Mental status - alert, oriented to person, place, and time, normal mood, behavior, speech, dress, motor activity, and thought processes Pelvic - CERVIX: high, closed, long, -3.  Labs: Pr/Cr ratio from this week elevated  1.237 Assessment: Mild pre-eclampsia at term,  Plan:  [facility]. Dr  Adrian BlackwaterStinson informed  No Follow-up on file.   By signing my name below, I, Vanessa Rice, attest that this documentation has been prepared under the direction and in the presence of Tilda BurrowJohn Talin Feister V, MD. Electronically Signed: Soijett Rice, ED Scribe. 09/09/2015. 12:36 PM.  I personally performed the services described in this documentation, which was SCRIBED in my presence. The recorded information has been reviewed and considered accurate. It has been edited as necessary during review. Tilda BurrowFERGUSON,Bernice Mullin V, MD

## 2015-09-09 NOTE — Progress Notes (Signed)
Armando ReichertKatie F Brittingham is a 30 y.o. G2P0010 at 4314w4d by LMP admitted for induction of labor due to preeclampsia.  Subjective: Patient sitting up in bed with family at bedside. Just ate dinner. Comfortable. Not feeling any contractions at this time.   Objective: BP 139/88 mmHg  Pulse 82  Temp(Src) 97.7 F (36.5 C) (Oral)  Resp 20  Ht 5\' 3"  (1.6 m)  Wt 113.853 kg (251 lb)  BMI 44.47 kg/m2  LMP 12/20/2014      FHT:  FHR: 135 bpm, variability: moderate,  accelerations:  Present,  decelerations:  Absent UC:   occasional SVE:   Dilation: Closed Effacement (%): 40 Station: -3 Exam by:: Wouk  Labs: Lab Results  Component Value Date   WBC 10.7* 09/09/2015   HGB 12.0 09/09/2015   HCT 35.1* 09/09/2015   MCV 90.7 09/09/2015   PLT 192 09/09/2015    Assessment / Plan: Induction of labor due to preeclampsia,  progressing well on pitocin  Labor: Progressing with Cytotec. Will reassess around 2300 to place another Cytotec. Consider placing Foley around 0300 Preeclampsia:  no headache, edema, or RUQ pain at this time Fetal Wellbeing:  Category I Pain Control:  IV pain meds and Epidural later I/D:  GBS +, PCN running Anticipated MOD:  NSVD  Beaulah DinningChristina M Sharea Guinther 09/09/2015, 10:25 PM

## 2015-09-09 NOTE — Progress Notes (Signed)
Vanessa ReichertKatie F Rice is a 30 y.o. G2P0010 at 4657w4d by LMP admitted for induction of labor due to preeclampsia.  Subjective: Patient still not feeling contractions regularly, will occasionally feel one. Is comfortable.   Objective: BP 139/88 mmHg  Pulse 82  Temp(Src) 97.7 F (36.5 C) (Oral)  Resp 20  Ht 5\' 3"  (1.6 m)  Wt 113.853 kg (251 lb)  BMI 44.47 kg/m2  LMP 12/20/2014      FHT:  FHR: 135 bpm, variability: moderate,  accelerations:  Present,  decelerations:  Absent UC:   occasional SVE:   Dilation: Fingertip Effacement (%): 60 Station: -3 Exam by:: Hermina Barters. Gambino, MD   Labs: Lab Results  Component Value Date   WBC 10.7* 09/09/2015   HGB 12.0 09/09/2015   HCT 35.1* 09/09/2015   MCV 90.7 09/09/2015   PLT 192 09/09/2015    Assessment / Plan: Induction of labor due to preeclampsia,  progressing well on pitocin  Labor: Progressing with Cytotec. Cytotec placed at 2300. Consider placing Foley around 0300 if cervix is unchanged Preeclampsia:  no headache, edema, or RUQ pain at this time. BP high 130s- mid 140s systolic Fetal Wellbeing:  Category I Pain Control:  IV pain meds and Epidural later I/D:  GBS +, PCN running Anticipated MOD:  NSVD  Beaulah DinningChristina M Gambino 09/09/2015, 11:26 PM

## 2015-09-10 ENCOUNTER — Encounter (HOSPITAL_COMMUNITY): Payer: Self-pay | Admitting: Anesthesiology

## 2015-09-10 LAB — RPR: RPR Ser Ql: NONREACTIVE

## 2015-09-10 MED ORDER — TERBUTALINE SULFATE 1 MG/ML IJ SOLN: 0.2500 mg | Freq: Once | INTRAMUSCULAR | Status: DC | PRN

## 2015-09-10 MED ORDER — OXYTOCIN 10 UNIT/ML IJ SOLN
1.0000 m[IU]/min | INTRAVENOUS | Status: DC
  Administered 2015-09-10: 2 m[IU]/min via INTRAVENOUS
  Administered 2015-09-11: 10 m[IU]/min via INTRAVENOUS
  Filled 2015-09-10 (×2): qty 10

## 2015-09-10 NOTE — Progress Notes (Signed)
EFM removed for pt. Shower per MD. IV saline locked, FHR tracing category 1 before removal of EFM.

## 2015-09-10 NOTE — Anesthesia Preprocedure Evaluation (Deleted)
Anesthesia Evaluation  Patient identified by MRN, date of birth, ID band Patient awake    Reviewed: Allergy & Precautions, Patient's Chart, lab work & pertinent test results  Airway Mallampati: III  TM Distance: >3 FB Neck ROM: Full    Dental no notable dental hx. (+) Teeth Intact   Pulmonary neg pulmonary ROS,    Pulmonary exam normal breath sounds clear to auscultation       Cardiovascular hypertension, Normal cardiovascular exam Rhythm:Regular Rate:Normal  Mild Pre eclampsia   Neuro/Psych negative neurological ROS  negative psych ROS   GI/Hepatic Neg liver ROS, GERD  ,  Endo/Other  Morbid obesity  Renal/GU negative Renal ROS  negative genitourinary   Musculoskeletal negative musculoskeletal ROS (+)   Abdominal (+) + obese,   Peds  Hematology  (+) anemia ,   Anesthesia Other Findings   Reproductive/Obstetrics (+) Pregnancy                             Anesthesia Physical Anesthesia Plan  ASA: III  Anesthesia Plan: Epidural   Post-op Pain Management:    Induction:   Airway Management Planned: Natural Airway  Additional Equipment:   Intra-op Plan:   Post-operative Plan:   Informed Consent: I have reviewed the patients History and Physical, chart, labs and discussed the procedure including the risks, benefits and alternatives for the proposed anesthesia with the patient or authorized representative who has indicated his/her understanding and acceptance.     Plan Discussed with: Anesthesiologist  Anesthesia Plan Comments:         Anesthesia Quick Evaluation

## 2015-09-10 NOTE — Progress Notes (Signed)
Patient ID: Armando ReichertKatie F Rice, female   DOB: Sep 28, 1985, 30 y.o.   MRN: [id number] Doing well, not feeling much pain with contractions  Filed Vitals:   09/10/15 1948 09/10/15 2125 09/10/15 2210 09/10/15 2230  BP: 158/89 146/88 131/88 146/92  Pulse: 88 82 77 75  Temp: 97.7 F (36.5 C)     TempSrc: Oral     Resp: 16 15 16 15   Height:      Weight:       FHR reassuring UCs every 2 min, mild  Will continue Pitocin to increase intensity of labor

## 2015-09-10 NOTE — Progress Notes (Addendum)
Vanessa Rice is a 30 y.o. G2P0010 at 1877w4d by LMP admitted for induction of labor due to preeclampsia.  Subjective: Patient reports some back spasms, interested in epidural once she begins active labor with Pitocin.Otherwise comfortable and satisfied with labor progress.  Objective: Filed Vitals:   09/10/15 1700 09/10/15 1702  BP:  132/85  Pulse:  74  Temp: 98.1 F (36.7 C)   Resp:  18    Dilation: 5 Effacement (%): 50 Cervical Position: Middle Station: -3 Presentation: Vertex Exam by:: leftwich-Kirby CNM  Foley bulb found to be out of cervix and in vagina during exam so removed without difficulty.  Assessment / Plan: Induction of labor due to preeclampsia S/p FB release at 1835  Labor: FB yielded 5 cm cervical dilation. Will allow labor break before beginning Pitocin @ 2 millunits and increase by 2 per protocol to start 1 hr after eating. Preeclampsia: no headache, edema, or RUQ pain at this time. BP high 130s- mid 140s systolic Fetal Wellbeing: Category I Pain Control: IV pain meds and Epidural later I/D: GBS +, PCN running Anticipated MOD: NSVD     LEFTWICH-KIRBY, Aniesa Boback, CNM 6:54 PM

## 2015-09-10 NOTE — Progress Notes (Signed)
Vanessa ReichertKatie F Rice is a 30 y.o. G2P0010 at 4294w5d  admitted for induction of labor due to preeclampsia with severe features.  Subjective: Pt feeling mild intermittent cramping. Husband in room for support.  Objective: BP 139/78 mmHg  Pulse 71  Temp(Src) 98.1 F (36.7 C) (Oral)  Resp 18  Ht 5\' 3"  (1.6 m)  Wt 251 lb (113.853 kg)  BMI 44.47 kg/m2  LMP 12/20/2014      FHT:  FHR: 145 bpm, variability: moderate,  accelerations:  Present,  decelerations:  Absent UC:   Irregular, occasional SVE:   Dilation: 1 Effacement (%): 50 Station: -3 Exam by:: Courtney ParisLeftwich Kirby, CNM Foley bulb placed without difficulty by CNM. RN filled balloon to 60 ml.  Pt tolerated well.  Labs: Lab Results  Component Value Date   WBC 10.7* 09/09/2015   HGB 12.0 09/09/2015   HCT 35.1* 09/09/2015   MCV 90.7 09/09/2015   PLT 192 09/09/2015    Assessment / Plan: Induction of labor due to preeclampsia with severe features S/P Cytotec FB in place  Labor: Progressing normally Preeclampsia:  labs stable Fetal Wellbeing:  Category I Pain Control:  Labor support without medications I/D:  n/a Anticipated MOD:  NSVD  LEFTWICH-KIRBY, Shaquon Gropp 09/10/2015, 1:15 PM

## 2015-09-11 ENCOUNTER — Inpatient Hospital Stay (HOSPITAL_COMMUNITY): Payer: 59 | Admitting: Anesthesiology

## 2015-09-11 LAB — CBC
HEMATOCRIT: 36 % (ref 36.0–46.0)
Hemoglobin: 12.2 g/dL (ref 12.0–15.0)
MCH: 31.2 pg (ref 26.0–34.0)
MCHC: 33.9 g/dL (ref 30.0–36.0)
MCV: 92.1 fL (ref 78.0–100.0)
Platelets: 176 10*3/uL (ref 150–400)
RBC: 3.91 MIL/uL (ref 3.87–5.11)
RDW: 13.6 % (ref 11.5–15.5)
WBC: 12.7 10*3/uL — ABNORMAL HIGH (ref 4.0–10.5)

## 2015-09-11 MED ORDER — LIDOCAINE HCL (PF) 1 % IJ SOLN
INTRAMUSCULAR | Status: DC | PRN
  Administered 2015-09-11: 2 mL via EPIDURAL
  Administered 2015-09-11: 3 mL via EPIDURAL
  Administered 2015-09-11: 5 mL via EPIDURAL

## 2015-09-11 MED ORDER — LACTATED RINGERS IV SOLN: 500.0000 mL | Freq: Once | INTRAVENOUS | Status: DC

## 2015-09-11 NOTE — Progress Notes (Signed)
Patient ID: Armando ReichertKatie F Rice, female   DOB: 05/09/1986, 30 y.o.   MRN: [id number] Still not feeling contractions much  Filed Vitals:   09/11/15 0530 09/11/15 0600 09/11/15 0630 09/11/15 0705  BP: 144/63 135/78 140/76 146/86  Pulse: 69 66 72 82  Temp:   98.2 F (36.8 C)   TempSrc:   Oral   Resp: 14 15 16 14   Height:      Weight:       FHR reactive UCs don't trace well, when we can trace them they appear to be every 2-4 minutes Dilation: 3.5 Effacement (%): 50 Cervical Position: Posterior Station: -3 Presentation: Vertex Exam by:: Vanessa MercyKayla Rouse, RN  Pitocin up to 5618mu/min  WIll continue plan of care

## 2015-09-11 NOTE — Progress Notes (Signed)
Vanessa ReichertKatie F Rice is a 30 y.o. G2P0010 at 843w6d by ultrasound admitted for induction of labor due to Pre-eclamptic toxemia of pregnancy..  Subjective: Patient doing well and was able to get some rest. Denies any PIH symptoms. Some elevated pressures but due to movement otherwise nurses say they have been normal.   Objective: BP 165/91 mmHg  Pulse 79  Temp(Src) 99.3 F (37.4 C) (Axillary)  Resp 18  Ht 5\' 3"  (1.6 m)  Wt 251 lb (113.853 kg)  BMI 44.47 kg/m2  SpO2 98%  LMP 12/20/2014     FHT:  FHR: 150 bpm, variability: moderate,  accelerations:  Abscent,  decelerations:  Absent UC:   None (off pitocin) SVE:   Dilation: 8 Effacement (%): 90 Station: -1 Exam by:: aleasha walker,rn; kishia stubbs, rn   Pitocin @10mu /min  Labs: Lab Results  Component Value Date   WBC 12.7* 09/11/2015   HGB 12.2 09/11/2015   HCT 36.0 09/11/2015   MCV 92.1 09/11/2015   PLT 176 09/11/2015    Assessment / Plan: Induction of labor due to preeclampsia.  Labor: Progressing normally; will restart pitocin.   Preeclampsia:  intake and ouput balanced. May need prn medications if BPs stay elevated.  Fetal Wellbeing:  Category I Pain Control:  Epidural I/D:  n/a Anticipated MOD:  NSVD   Vanessa AdaJazma Phelps, DO 09/11/2015, 10:32 PM PGY-2, [facility]

## 2015-09-11 NOTE — Anesthesia Preprocedure Evaluation (Addendum)
Anesthesia Evaluation  Patient identified by MRN, date of birth, ID band Patient awake    Reviewed: Allergy & Precautions, NPO status , Patient's Chart, lab work & pertinent test results  Airway Mallampati: II  TM Distance: >3 FB Neck ROM: Full    Dental  (+) Teeth Intact, Dental Advisory Given   Pulmonary neg pulmonary ROS,    Pulmonary exam normal breath sounds clear to auscultation       Cardiovascular hypertension (PIH), Normal cardiovascular exam Rhythm:Regular Rate:Normal     Neuro/Psych negative neurological ROS  negative psych ROS   GI/Hepatic negative GI ROS, Neg liver ROS,   Endo/Other  Morbid obesity  Renal/GU negative Renal ROS     Musculoskeletal negative musculoskeletal ROS (+)   Abdominal   Peds  Hematology negative hematology ROS (+) Plt 176k    Anesthesia Other Findings Day of surgery medications reviewed with the patient.  Reproductive/Obstetrics (+) Pregnancy Pre-eclampsia                            Anesthesia Physical Anesthesia Plan  ASA: III and emergent  Anesthesia Plan: Epidural   Post-op Pain Management:    Induction:   Airway Management Planned:   Additional Equipment:   Intra-op Plan:   Post-operative Plan:   Informed Consent: I have reviewed the patients History and Physical, chart, labs and discussed the procedure including the risks, benefits and alternatives for the proposed anesthesia with the patient or authorized representative who has indicated his/her understanding and acceptance.   Dental advisory given  Plan Discussed with:   Anesthesia Plan Comments: (Patient identified. Risks/Benefits/Options discussed with patient including but not limited to bleeding, infection, nerve damage, paralysis, failed block, incomplete pain control, headache, blood pressure changes, nausea, vomiting, reactions to medication both or allergic, itching and  postpartum back pain. Confirmed with bedside nurse the patient's most recent platelet count. Confirmed with patient that they are not currently taking any anticoagulation, have any bleeding history or any family history of bleeding disorders. Patient expressed understanding and wished to proceed. All questions were answered.   Pt now for C-section, failure to progress.  Plan to use LEA for anesthesia.  Sandford Craze Rhodes Calvert, MD)       Anesthesia Quick Evaluation

## 2015-09-11 NOTE — Progress Notes (Signed)
Daylight savings time, at 0200 became 0300; therefore no data 0200-0300

## 2015-09-11 NOTE — Progress Notes (Signed)
Patient ID: Vanessa ReichertKatie F Rice, female   DOB: 08-27-1985, 30 y.o.   MRN: [id number] S: c/o mod back pain. O: SVE 3-4/80/-2. AROM lg amt clear fluid. IUPC placed without difficulty. A: IOL for mild pre e, stable masternal fetal unit. P: continue present POC

## 2015-09-11 NOTE — Progress Notes (Signed)
Armando ReichertKatie F Hubka is a 30 y.o. G2P0010 at 7364w6d by ultrasound admitted for induction of labor due to Pre-eclamptic toxemia of pregnancy..  Subjective:   Objective: BP 139/71 mmHg  Pulse 81  Temp(Src) 98.5 F (36.9 C) (Oral)  Resp 16  Ht 5\' 3"  (1.6 m)  Wt 251 lb (113.853 kg)  BMI 44.47 kg/m2  SpO2 98%  LMP 12/20/2014      FHT:  FHR: 140's bpm, variability: moderate,  accelerations:  Present,  decelerations:  Present variable UC:   regular, every 2-3 minutes SVE:   Dilation: 5 Effacement (%): 80 Station: -2 Exam by:: [employer]iddleton RN  Labs: Lab Results  Component Value Date   WBC 12.7* 09/11/2015   HGB 12.2 09/11/2015   HCT 36.0 09/11/2015   MCV 92.1 09/11/2015   PLT 176 09/11/2015    Assessment / Plan: Induction of labor due to preeclampsia,  progressing well on pitocin  Labor: Progressing normally Preeclampsia:  intake and ouput balanced Fetal Wellbeing:  Category I Pain Control:  Epidural I/D:  n/a Anticipated MOD:  NSVD  LAWSON, MARIE DARLENE 09/11/2015, 2:37 PM

## 2015-09-11 NOTE — Progress Notes (Signed)
Per verbal order from Zerita Boersarlene Lawson, CNM, RN turned Pitocin off at 1825 (after 2 hours at 38 milliunits/hr). Patient aware and educated. No current needs. Will call PRN, and RN will continue to assess and evaluate as needed. Daquavion Catala HectorBrown Delana Manganello, [place]RN 09/11/2015 6:36 PM

## 2015-09-11 NOTE — Anesthesia Procedure Notes (Signed)
Epidural Patient location during procedure: OB  Staffing Anesthesiologist: Emerson Barretto EDWARD Performed by: anesthesiologist   Preanesthetic Checklist Completed: patient identified, pre-op evaluation, timeout performed, IV checked, risks and benefits discussed and monitors and equipment checked  Epidural Patient position: sitting Prep: DuraPrep Patient monitoring: blood pressure and continuous pulse ox Approach: midline Location: L3-L4 Injection technique: LOR air  Needle:  Needle type: Tuohy  Needle gauge: 17 G Needle length: 9 cm Needle insertion depth: 6 cm Catheter size: 19 Gauge Catheter at skin depth: 11 cm Test dose: negative and Other (1% Lidocaine)  Additional Notes Patient identified.  Risk benefits discussed including failed block, incomplete pain control, headache, nerve damage, paralysis, blood pressure changes, nausea, vomiting, reactions to medication both toxic or allergic, and postpartum back pain.  Patient expressed understanding and wished to proceed.  All questions were answered.  Sterile technique used throughout procedure and epidural site dressed with sterile barrier dressing. No paresthesia or other complications noted. The patient did not experience any signs of intravascular injection such as tinnitus or metallic taste in mouth nor signs of intrathecal spread such as rapid motor block. Please see nursing notes for vital signs. Reason for block:procedure for pain   

## 2015-09-12 ENCOUNTER — Encounter (HOSPITAL_COMMUNITY)
Admission: AD | Disposition: A | Discharge status: Home / Self Care | Payer: Self-pay | Source: Ambulatory Visit | Attending: Obstetrics & Gynecology

## 2015-09-12 ENCOUNTER — Encounter (HOSPITAL_COMMUNITY): Payer: Self-pay | Admitting: *Deleted

## 2015-09-12 DIAGNOSIS — Z98891 History of uterine scar from previous surgery: Secondary | ICD-10-CM

## 2015-09-12 DIAGNOSIS — O99824 Streptococcus B carrier state complicating childbirth: Secondary | ICD-10-CM

## 2015-09-12 DIAGNOSIS — Z3A37 37 weeks gestation of pregnancy: Secondary | ICD-10-CM

## 2015-09-12 DIAGNOSIS — O1404 Mild to moderate pre-eclampsia, complicating childbirth: Secondary | ICD-10-CM

## 2015-09-12 DIAGNOSIS — D62 Acute posthemorrhagic anemia: Secondary | ICD-10-CM

## 2015-09-12 DIAGNOSIS — O99214 Obesity complicating childbirth: Secondary | ICD-10-CM

## 2015-09-12 DIAGNOSIS — O9081 Anemia of the puerperium: Secondary | ICD-10-CM

## 2015-09-12 LAB — CBC
HEMATOCRIT: 32.6 % — AB (ref 36.0–46.0)
HEMATOCRIT: 27.5 % — AB (ref 36.0–46.0)
HEMOGLOBIN: 10.9 g/dL — AB (ref 12.0–15.0)
HEMOGLOBIN: 9.4 g/dL — AB (ref 12.0–15.0)
MCH: 30.6 pg (ref 26.0–34.0)
MCH: 31.3 pg (ref 26.0–34.0)
MCHC: 33.4 g/dL (ref 30.0–36.0)
MCHC: 34.2 g/dL (ref 30.0–36.0)
MCV: 91.6 fL (ref 78.0–100.0)
MCV: 91.7 fL (ref 78.0–100.0)
Platelets: 177 10*3/uL (ref 150–400)
Platelets: 194 10*3/uL (ref 150–400)
RBC: 3.56 MIL/uL — AB (ref 3.87–5.11)
RBC: 3 MIL/uL — ABNORMAL LOW (ref 3.87–5.11)
RDW: 13.5 % (ref 11.5–15.5)
RDW: 13.8 % (ref 11.5–15.5)
WBC: 19 10*3/uL — ABNORMAL HIGH (ref 4.0–10.5)
WBC: 21.5 10*3/uL — ABNORMAL HIGH (ref 4.0–10.5)

## 2015-09-12 SURGERY — Surgical Case
Anesthesia: Epidural

## 2015-09-12 MED ORDER — ONDANSETRON HCL 4 MG/2ML IJ SOLN
4.0000 mg | Freq: Three times a day (TID) | INTRAMUSCULAR | Status: DC | PRN
  Administered 2015-09-12 – 2015-09-13 (×2): 4 mg via INTRAVENOUS
  Filled 2015-09-12 (×2): qty 2

## 2015-09-12 MED ORDER — DIBUCAINE 1 % RE OINT: 1.0000 "application " | TOPICAL_OINTMENT | RECTAL | Status: DC | PRN

## 2015-09-12 MED ORDER — SIMETHICONE 80 MG PO CHEW
80.0000 mg | CHEWABLE_TABLET | Freq: Three times a day (TID) | ORAL | Status: DC
  Administered 2015-09-12 – 2015-09-15 (×10): 80 mg via ORAL
  Filled 2015-09-12 (×9): qty 1

## 2015-09-12 MED ORDER — ACETAMINOPHEN 325 MG PO TABS
650.0000 mg | ORAL_TABLET | ORAL | Status: DC | PRN
  Administered 2015-09-13: 650 mg via ORAL
  Filled 2015-09-12: qty 2

## 2015-09-12 MED ORDER — FENTANYL CITRATE (PF) 100 MCG/2ML IJ SOLN
INTRAMUSCULAR | Status: DC | PRN
  Administered 2015-09-12: 100 ug via EPIDURAL

## 2015-09-12 MED ORDER — MISOPROSTOL 200 MCG PO TABS
800.0000 ug | ORAL_TABLET | Freq: Once | ORAL | Status: AC
  Administered 2015-09-12: 800 ug via ORAL
  Filled 2015-09-12: qty 4

## 2015-09-12 MED ORDER — NALBUPHINE HCL 10 MG/ML IJ SOLN: 5.0000 mg | Freq: Once | INTRAMUSCULAR | Status: DC | PRN

## 2015-09-12 MED ORDER — NALBUPHINE HCL 10 MG/ML IJ SOLN: 5.0000 mg | INTRAMUSCULAR | Status: DC | PRN

## 2015-09-12 MED ORDER — FENTANYL CITRATE (PF) 100 MCG/2ML IJ SOLN
INTRAMUSCULAR | Status: AC
Start: 2015-09-12 — End: 2015-09-12
  Filled 2015-09-12: qty 2

## 2015-09-12 MED ORDER — LIDOCAINE-EPINEPHRINE (PF) 2 %-1:200000 IJ SOLN
INTRAMUSCULAR | Status: DC | PRN
  Administered 2015-09-12: 3 mL via EPIDURAL
  Administered 2015-09-12 (×2): 5 mL via EPIDURAL

## 2015-09-12 MED ORDER — NALOXONE HCL 0.4 MG/ML IJ SOLN: 0.4000 mg | INTRAMUSCULAR | Status: DC | PRN

## 2015-09-12 MED ORDER — IBUPROFEN 600 MG PO TABS
600.0000 mg | ORAL_TABLET | Freq: Four times a day (QID) | ORAL | Status: DC
  Administered 2015-09-12 – 2015-09-15 (×12): 600 mg via ORAL
  Filled 2015-09-12 (×12): qty 1

## 2015-09-12 MED ORDER — MORPHINE SULFATE (PF) 0.5 MG/ML IJ SOLN
INTRAMUSCULAR | Status: AC
  Filled 2015-09-12: qty 10

## 2015-09-12 MED ORDER — HYDRALAZINE HCL 20 MG/ML IJ SOLN
10.0000 mg | Freq: Once | INTRAMUSCULAR | Status: AC
  Administered 2015-09-12: 10 mg via INTRAVENOUS
  Filled 2015-09-12: qty 1

## 2015-09-12 MED ORDER — LANOLIN HYDROUS EX OINT: 1.0000 "application " | TOPICAL_OINTMENT | CUTANEOUS | Status: DC | PRN

## 2015-09-12 MED ORDER — CEFAZOLIN SODIUM-DEXTROSE 2-3 GM-% IV SOLR
INTRAVENOUS | Status: AC
  Filled 2015-09-12: qty 50

## 2015-09-12 MED ORDER — WITCH HAZEL-GLYCERIN EX PADS
1.0000 | MEDICATED_PAD | CUTANEOUS | Status: DC | PRN
Start: 2015-09-12 — End: 2015-09-15

## 2015-09-12 MED ORDER — CEFAZOLIN (ANCEF) 1 G IV SOLR: 2.0000 g | INTRAVENOUS | Status: DC

## 2015-09-12 MED ORDER — MORPHINE SULFATE (PF) 0.5 MG/ML IJ SOLN
INTRAMUSCULAR | Status: DC | PRN
  Administered 2015-09-12: 3 mg via EPIDURAL

## 2015-09-12 MED ORDER — METHYLERGONOVINE MALEATE 0.2 MG PO TABS: 0.2000 mg | ORAL_TABLET | ORAL | Status: DC | PRN

## 2015-09-12 MED ORDER — LACTATED RINGERS IV SOLN
INTRAVENOUS | Status: DC | PRN
  Administered 2015-09-12: 08:00:00 via INTRAVENOUS

## 2015-09-12 MED ORDER — HYDRALAZINE HCL 20 MG/ML IJ SOLN: 10.0000 mg | Freq: Once | INTRAMUSCULAR | Status: DC | PRN

## 2015-09-12 MED ORDER — SENNOSIDES-DOCUSATE SODIUM 8.6-50 MG PO TABS
2.0000 | ORAL_TABLET | ORAL | Status: DC
  Administered 2015-09-12 – 2015-09-14 (×2): 2 via ORAL
  Filled 2015-09-12 (×2): qty 2

## 2015-09-12 MED ORDER — HYDRALAZINE HCL 20 MG/ML IJ SOLN: 10.0000 mg | Freq: Once | INTRAMUSCULAR | Status: DC

## 2015-09-12 MED ORDER — METHYLERGONOVINE MALEATE 0.2 MG/ML IJ SOLN: 0.2000 mg | INTRAMUSCULAR | Status: DC | PRN

## 2015-09-12 MED ORDER — OXYTOCIN 10 UNIT/ML IJ SOLN
INTRAMUSCULAR | Status: AC
  Filled 2015-09-12: qty 4

## 2015-09-12 MED ORDER — KETOROLAC TROMETHAMINE 30 MG/ML IJ SOLN
30.0000 mg | Freq: Four times a day (QID) | INTRAMUSCULAR | Status: AC | PRN
  Administered 2015-09-12: 30 mg via INTRAVENOUS
  Filled 2015-09-12: qty 1

## 2015-09-12 MED ORDER — ONDANSETRON HCL 4 MG/2ML IJ SOLN
INTRAMUSCULAR | Status: AC
  Filled 2015-09-12: qty 2

## 2015-09-12 MED ORDER — SODIUM BICARBONATE 8.4 % IV SOLN
INTRAVENOUS | Status: AC
  Filled 2015-09-12: qty 50

## 2015-09-12 MED ORDER — MEPERIDINE HCL 25 MG/ML IJ SOLN: 6.2500 mg | INTRAMUSCULAR | Status: DC | PRN

## 2015-09-12 MED ORDER — MEPERIDINE HCL 25 MG/ML IJ SOLN
INTRAMUSCULAR | Status: AC
  Filled 2015-09-12: qty 1

## 2015-09-12 MED ORDER — DIPHENHYDRAMINE HCL 25 MG PO CAPS: 25.0000 mg | ORAL_CAPSULE | ORAL | Status: DC | PRN

## 2015-09-12 MED ORDER — MIDAZOLAM HCL 2 MG/2ML IJ SOLN: 0.5000 mg | Freq: Once | INTRAMUSCULAR | Status: DC | PRN

## 2015-09-12 MED ORDER — FENTANYL CITRATE (PF) 100 MCG/2ML IJ SOLN
25.0000 ug | INTRAMUSCULAR | Status: DC | PRN
  Administered 2015-09-12: 50 ug via INTRAVENOUS

## 2015-09-12 MED ORDER — SODIUM CHLORIDE 0.9 % IR SOLN
Status: DC | PRN
  Administered 2015-09-12: 1000 mL

## 2015-09-12 MED ORDER — TETANUS-DIPHTH-ACELL PERTUSSIS 5-2.5-18.5 LF-MCG/0.5 IM SUSP: 0.5000 mL | Freq: Once | INTRAMUSCULAR | Status: DC

## 2015-09-12 MED ORDER — ONDANSETRON HCL 4 MG/2ML IJ SOLN
INTRAMUSCULAR | Status: DC | PRN
  Administered 2015-09-12: 4 mg via INTRAVENOUS

## 2015-09-12 MED ORDER — LACTATED RINGERS IV SOLN: 2.5000 [IU]/h | INTRAVENOUS | Status: AC

## 2015-09-12 MED ORDER — DIPHENHYDRAMINE HCL 50 MG/ML IJ SOLN: 12.5000 mg | INTRAMUSCULAR | Status: DC | PRN

## 2015-09-12 MED ORDER — KETOROLAC TROMETHAMINE 30 MG/ML IJ SOLN: 30.0000 mg | Freq: Four times a day (QID) | INTRAMUSCULAR | Status: AC | PRN

## 2015-09-12 MED ORDER — SCOPOLAMINE 1 MG/3DAYS TD PT72
1.0000 | MEDICATED_PATCH | Freq: Once | TRANSDERMAL | Status: DC
  Filled 2015-09-12: qty 1

## 2015-09-12 MED ORDER — ZOLPIDEM TARTRATE 5 MG PO TABS: 5.0000 mg | ORAL_TABLET | Freq: Every evening | ORAL | Status: DC | PRN

## 2015-09-12 MED ORDER — DIPHENHYDRAMINE HCL 25 MG PO CAPS: 25.0000 mg | ORAL_CAPSULE | Freq: Four times a day (QID) | ORAL | Status: DC | PRN

## 2015-09-12 MED ORDER — FENTANYL CITRATE (PF) 100 MCG/2ML IJ SOLN
INTRAMUSCULAR | Status: AC
  Filled 2015-09-12: qty 2

## 2015-09-12 MED ORDER — OXYTOCIN 10 UNIT/ML IJ SOLN
40.0000 [IU] | INTRAVENOUS | Status: DC | PRN
  Administered 2015-09-12: 40 [IU] via INTRAVENOUS

## 2015-09-12 MED ORDER — SIMETHICONE 80 MG PO CHEW: 80.0000 mg | CHEWABLE_TABLET | ORAL | Status: DC | PRN

## 2015-09-12 MED ORDER — MENTHOL 3 MG MT LOZG: 1.0000 | LOZENGE | OROMUCOSAL | Status: DC | PRN

## 2015-09-12 MED ORDER — PROMETHAZINE HCL 25 MG/ML IJ SOLN: 6.2500 mg | INTRAMUSCULAR | Status: DC | PRN

## 2015-09-12 MED ORDER — PROMETHAZINE HCL 25 MG/ML IJ SOLN: 12.5000 mg | Freq: Four times a day (QID) | INTRAMUSCULAR | Status: DC | PRN

## 2015-09-12 MED ORDER — LIDOCAINE-EPINEPHRINE (PF) 2 %-1:200000 IJ SOLN
INTRAMUSCULAR | Status: AC
  Filled 2015-09-12: qty 20

## 2015-09-12 MED ORDER — MEPERIDINE HCL 25 MG/ML IJ SOLN
INTRAMUSCULAR | Status: DC | PRN
  Administered 2015-09-12 (×2): 12.5 mg via INTRAVENOUS

## 2015-09-12 MED ORDER — PRENATAL MULTIVITAMIN CH
1.0000 | ORAL_TABLET | Freq: Every day | ORAL | Status: DC
  Administered 2015-09-13 – 2015-09-15 (×3): 1 via ORAL
  Filled 2015-09-12 (×3): qty 1

## 2015-09-12 MED ORDER — SIMETHICONE 80 MG PO CHEW
80.0000 mg | CHEWABLE_TABLET | ORAL | Status: DC
Start: 2015-09-13 — End: 2015-09-15
  Administered 2015-09-12 – 2015-09-14 (×2): 80 mg via ORAL
  Filled 2015-09-12 (×3): qty 1

## 2015-09-12 MED ORDER — NALOXONE HCL 2 MG/2ML IJ SOSY
1.0000 ug/kg/h | PREFILLED_SYRINGE | INTRAMUSCULAR | Status: DC | PRN
  Filled 2015-09-12: qty 2

## 2015-09-12 MED ORDER — LACTATED RINGERS IV SOLN
INTRAVENOUS | Status: DC
  Administered 2015-09-12 – 2015-09-13 (×3): via INTRAVENOUS

## 2015-09-12 MED ORDER — SODIUM CHLORIDE 0.9% FLUSH: 3.0000 mL | INTRAVENOUS | Status: DC | PRN

## 2015-09-12 MED ORDER — CEFAZOLIN SODIUM-DEXTROSE 2-3 GM-% IV SOLR
2.0000 g | Freq: Once | INTRAVENOUS | Status: AC
  Administered 2015-09-12: 2 g via INTRAVENOUS

## 2015-09-12 MED ORDER — LABETALOL HCL 5 MG/ML IV SOLN: 20.0000 mg | INTRAVENOUS | Status: DC | PRN

## 2015-09-12 MED ORDER — NALBUPHINE HCL 10 MG/ML IJ SOLN
10.0000 mg | INTRAMUSCULAR | Status: DC | PRN
Start: 2015-09-12 — End: 2015-09-12

## 2015-09-12 SURGICAL SUPPLY — 35 items
CLAMP CORD UMBIL (MISCELLANEOUS) IMPLANT
CLOTH BEACON ORANGE TIMEOUT ST (SAFETY) ×3 IMPLANT
DRSG OPSITE POSTOP 4X10 (GAUZE/BANDAGES/DRESSINGS) ×3 IMPLANT
DURAPREP 26ML APPLICATOR (WOUND CARE) ×6 IMPLANT
ELECT REM PT RETURN 9FT ADLT (ELECTROSURGICAL) ×3
ELECTRODE REM PT RTRN 9FT ADLT (ELECTROSURGICAL) ×1 IMPLANT
EXTRACTOR VACUUM BELL STYLE (SUCTIONS) IMPLANT
GLOVE BIOGEL PI IND STRL 7.0 (GLOVE) ×1 IMPLANT
GLOVE BIOGEL PI IND STRL 8 (GLOVE) ×1 IMPLANT
GLOVE BIOGEL PI INDICATOR 7.0 (GLOVE) ×2
GLOVE BIOGEL PI INDICATOR 8 (GLOVE) ×2
GLOVE ECLIPSE 8.0 STRL XLNG CF (GLOVE) ×3 IMPLANT
GOWN STRL REUS W/TWL LRG LVL3 (GOWN DISPOSABLE) ×6 IMPLANT
KIT ABG SYR 3ML LUER SLIP (SYRINGE) ×3 IMPLANT
LIQUID BAND (GAUZE/BANDAGES/DRESSINGS) ×6 IMPLANT
NEEDLE HYPO 18GX1.5 BLUNT FILL (NEEDLE) ×3 IMPLANT
NEEDLE HYPO 22GX1.5 SAFETY (NEEDLE) ×3 IMPLANT
NEEDLE HYPO 25X5/8 SAFETYGLIDE (NEEDLE) ×3 IMPLANT
NS IRRIG 1000ML POUR BTL (IV SOLUTION) ×3 IMPLANT
PACK C SECTION WH (CUSTOM PROCEDURE TRAY) ×3 IMPLANT
PAD OB MATERNITY 4.3X12.25 (PERSONAL CARE ITEMS) ×3 IMPLANT
PENCIL SMOKE EVAC W/HOLSTER (ELECTROSURGICAL) ×3 IMPLANT
RTRCTR C-SECT PINK 25CM LRG (MISCELLANEOUS) IMPLANT
SUT CHROMIC 0 CT 1 (SUTURE) ×3 IMPLANT
SUT MNCRL 0 VIOLET CTX 36 (SUTURE) ×3 IMPLANT
SUT MONOCRYL 0 CTX 36 (SUTURE) ×6
SUT PLAIN 2 0 (SUTURE)
SUT PLAIN 2 0 XLH (SUTURE) IMPLANT
SUT PLAIN ABS 2-0 CT1 27XMFL (SUTURE) IMPLANT
SUT VIC AB 0 CTX 36 (SUTURE) ×2
SUT VIC AB 0 CTX36XBRD ANBCTRL (SUTURE) ×1 IMPLANT
SUT VIC AB 4-0 KS 27 (SUTURE) ×3 IMPLANT
SYR 20CC LL (SYRINGE) ×6 IMPLANT
TOWEL OR 17X24 6PK STRL BLUE (TOWEL DISPOSABLE) ×3 IMPLANT
TRAY FOLEY CATH SILVER 14FR (SET/KITS/TRAYS/PACK) IMPLANT

## 2015-09-12 NOTE — Lactation Note (Signed)
This note was copied from a baby's chart. Lactation Consultation Note  Patient Name: Vanessa Rice BJYNW'GToday's Date: 09/12/2015 Reason for consult: Initial assessment Baby at 10 hr of life and mom feels like bf is going well. She denies breast or nipple pain, voiced no concerns. 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 She stated that she has been taught manual expression and has seen colostrum bilaterally. She has a spoon in the room. Discussed baby behavior, feeding frequency, pumping, baby belly size, voids, wt loss, breast changes, and nipple care. Given lactation handouts. Aware of OP services and support group.     Maternal Data Has patient been taught Hand Expression?: Yes Does the patient have breastfeeding experience prior to this delivery?: No  Feeding Feeding Type: Breast Fed Length of feed: 10 min  LATCH Score/Interventions Latch: Grasps breast easily, tongue down, lips flanged, rhythmical sucking.  Audible Swallowing: A few with stimulation  Type of Nipple: Everted at rest and after stimulation  Comfort (Breast/Nipple): Soft / non-tender     Hold (Positioning): Full assist, staff holds infant at breast  LATCH Score: 7  Lactation Tools Discussed/Used WIC Program: No   Consult Status Consult Status: Follow-up Date: 09/13/15 Follow-up type: In-patient    [name] 09/12/2015, 7:34 PM

## 2015-09-12 NOTE — Progress Notes (Signed)
Vanessa Rice CNM NOTIFIED OF PATIENT'S BP=140'S/60'S-90'S. PATIENT'S BLEEDING WNL. HGB=9.4 AT THIS TIME WITH REPEAT IN THE AM. CONTINUE TO OBSERVE AND CALL WITH BP'S IN SEVERE RANGE 160/110

## 2015-09-12 NOTE — Addendum Note (Signed)
Addendum  created 09/12/15 1344 by Algis GreenhouseLinda A Vincie Linn, CRNA   Modules edited: Clinical Notes   Clinical Notes:  File: [id number]

## 2015-09-12 NOTE — Progress Notes (Signed)
Called Dr Adrian BlackwaterStinson reported EBL in 50minutes 193ml followed by EBL 40ml in 30 min. FF@U  scant lochia with massage. Urine out pt in 1.5 hrs is 125ml. BP 114/74, hr 103  Rapid response RN Saunders GlanceMary Earley assesses pt, FF @U  small lochia. No further orders from DR Hunter [facility].

## 2015-09-12 NOTE — Progress Notes (Signed)
Vanessa Rice is a 30 y.o. G2P0010 at 357w6d by ultrasound admitted for induction of labor due to Pre-eclamptic toxemia of pregnancy.  Subjective: Patient doing well. Denies any PIH symptoms. Concerned about why she is not progressing. Having most of her pain in her back.  Objective: BP 165/100 mmHg  Pulse 100  Temp(Src) 97.8 F (36.6 C) (Oral)  Resp 16  Ht 5\' 3"  (1.6 m)  Wt 251 lb (113.853 kg)  BMI 44.47 kg/m2  SpO2 98%  LMP 12/20/2014   Total I/O In: -  Out: 600 [Urine:600] FHT:  FHR: 150 bpm, variability: moderate,  accelerations:  Present,  decelerations:  Present variable UC:   Every 2-4 minutes SVE:   Dilation: 9 Effacement (%): 100 Station: 0 Exam by:: L.Stubbs, RN/A.Walker, RN   Pitocin @30mu /min  Labs: Lab Results  Component Value Date   WBC 12.7* 09/11/2015   HGB 12.2 09/11/2015   HCT 36.0 09/11/2015   MCV 92.1 09/11/2015   PLT 176 09/11/2015    Assessment / Plan: Induction of labor due to preeclampsia. Arrest of progress despite adequate contractions.  Labor: Has not made much progress, not descending; continue pitocin. Preeclampsia:  intake and ouput balanced. Will add prn BP medications. Fetal Wellbeing:  Category II Pain Control:  Epidural I/D:  n/a Anticipated MOD:  NSVD, patient is aware of possible need for c-section   Vanessa AdaJazma Phelps, DO 09/12/2015, 6:48 AM PGY-2, [facility]

## 2015-09-12 NOTE — Transfer of Care (Addendum)
Immediate Anesthesia Transfer of Care Note  Patient: Vanessa ReichertKatie F Rice  Procedure(s) Performed: Procedure(s): CESAREAN SECTION (N/A)  Patient Location: PACU  Anesthesia Type:Epidural  Level of Consciousness: awake  Airway & Oxygen Therapy: Patient Spontanous Breathing  Post-op Assessment: Report given to RN  Post vital signs: Reviewed  Last Vitals:  Filed Vitals:   09/12/15 0631 09/12/15 0701  BP: 165/100 167/105  Pulse: 100 94  Temp:    Resp:      Complications: No apparent anesthesia complications

## 2015-09-12 NOTE — Anesthesia Postprocedure Evaluation (Signed)
Anesthesia Post Note  Patient: Vanessa ReichertKatie F Rice  Procedure(s) Performed: Procedure(s) (LRB): CESAREAN SECTION (N/A)  Patient location during evaluation: PACU Anesthesia Type: Regional and Epidural Level of consciousness: awake and alert, patient cooperative and oriented Pain management: pain level controlled Vital Signs Assessment: post-procedure vital signs reviewed and stable Respiratory status: spontaneous breathing, nonlabored ventilation and respiratory function stable Cardiovascular status: blood pressure returned to baseline and stable Postop Assessment: no headache, no backache, epidural receding, patient able to bend at knees and no signs of nausea or vomiting Anesthetic complications: no    Last Vitals:  Filed Vitals:   09/12/15 0849 09/12/15 0900  BP:  127/81  Pulse: 96 91  Temp:    Resp: 21 19    Last Pain:  Filed Vitals:   09/12/15 0909  PainSc: 0-No pain                 Rylyn Zawistowski,E. Gaege Sangalang

## 2015-09-12 NOTE — Op Note (Signed)
Preoperative diagnosis:  1.  Intrauterine pregnancy at 241w0d  weeks gestation                                         2.  Secondary arrest of dilatation and descent                                         3.  Persistent ROP and asynclitism   Postoperative diagnosis:  Same as above   Procedure:  Primary cesarean section  Surgeon:  Lazaro ArmsLuther H Eure MD  Assistant:    Anesthesia: epidural  Findings:  As above, ROP, asynclitic.    Over a low transverse incision was delivered a viable female with Apgars of 9 and 9 weighing  lbs.  oz. Uterus, tubes and ovaries were all normal.  There were no other significant findings  Description of operation:  Patient was taken to the operating room and placed in the sitting position where she underwent a spinal anesthetic. She was then placed in the supine position with tilt to the left side. When adequate anesthetic level was obtained she was prepped and draped in usual sterile fashion and a Foley catheter was placed. A Pfannenstiel skin incision was made and carried down sharply to the rectus fascia which was scored in the midline extended laterally. The fascia was taken off the muscles both superiorly and without difficulty. The muscles were divided.  The peritoneal cavity was entered.  Bladder blade was placed, no bladder flap was created.  A low transverse hysterotomy incision was made and delivered a viable female  infant at [id number] with Apgars of 9 and 9 weighing lbs  oz.  Cord pH was obtained and was . The uterus was exteriorized. It was closed in 2 layers, the first being a running interlocking layer and the second being an imbricating layer using 0 monocryl on a CTX needle. There was good resulting hemostasis. The uterus tubes and ovaries were all normal. Peritoneal cavity was irrigated vigorously. The muscles and peritoneum were reapproximated loosely. The fascia was closed using 0 Vicryl in running fashion. Subcutaneous tissue was made hemostatic and irrigated. The  skin was closed using 4-0 Vicryl on a Keith needle in a subcuticular fashion.  Dermabond was placed for additional wound integrity and to serve as a barrier. Blood loss for the procedure was 750 cc. The patient received a gram of Ancef prophylactically. The patient was taken to the recovery room in good stable condition with all counts being correct x3.  EBL 750 cc  EURE,LUTHER H 09/12/2015 8:46 AM

## 2015-09-12 NOTE — Progress Notes (Signed)
Called by RN for increased EBL. Patient meets criteria for PPH. Fundal is firm but continues to have gushes, last with breastfeeding (111mL).  Reports dizziness with changes in position.  O: BP 114/74 mmHg  Pulse 103  Temp(Src) 98.5 F (36.9 C) (Other (Comment))  Resp 18  Ht 5\' 3"  (1.6 m)  Wt 251 lb (113.853 kg)  BMI 44.47 kg/m2  SpO2 97%  LMP 12/20/2014  Breastfeeding? Unknown  Gen: well appearing, breastfeeding Lungs: normal WOB CV: mild tachycardia Fundus: appropriately tender, firm.  GU: no bleeding with fundal exam.   I/O last 3 completed shifts: In: -  Out: 600 [Urine:600] Total I/O In: 1400 [I.V.:1400] Out: 2175 [Urine:625; Blood:1550]  A/P: Postpartum hemorrhage (EBL~1500) - Cytotec 800mg  buccal - H/H now and in AM - monitor vs closely - discussed possible transfusion if appropriate - plan for methergine vs hemabate, will weight SE profile of hemabate and relative contraindication of methergine with PreX without severe features  Federico FlakeKimberly Niles Newton, MD

## 2015-09-12 NOTE — Anesthesia Postprocedure Evaluation (Signed)
Anesthesia Post Note  Patient: Armando ReichertKatie F Trivedi  Procedure(s) Performed: Procedure(s) (LRB): CESAREAN SECTION (N/A)  Patient location during evaluation: Mother Baby Anesthesia Type: Epidural Level of consciousness: awake Pain management: satisfactory to patient Vital Signs Assessment: post-procedure vital signs reviewed and stable Respiratory status: spontaneous breathing Cardiovascular status: stable Anesthetic complications: no Comments: Current pain 3, pain goal 7    Last Vitals:  Filed Vitals:   09/12/15 1115 09/12/15 1215  BP: 130/75 136/77  Pulse: 90 81  Temp: 36.7 C 36.7 C  Resp: 20 19    Last Pain:  Filed Vitals:   09/12/15 1238  PainSc: 2                  Macala Baldonado

## 2015-09-12 NOTE — Progress Notes (Signed)
Notified Dr Adrian BlackwaterStinson of pt status: emesis @1345 , zofran. Nausea resolved. Ortho sttaic vs Lying 124/76, hr 86,  Sitting 147/90 hr98, dangle 138/ 86, hr 110. Pt was dizzy standing, unable to get BP.Satuarted pad  EBL 335g, pad when standing, EBL 121g.  EBL total in OR and weighed pads is 1206. FF@U  in bed Recheck VS 132 82 @1640 . No new orders received.

## 2015-09-13 ENCOUNTER — Encounter (HOSPITAL_COMMUNITY): Payer: Self-pay | Admitting: Family Medicine

## 2015-09-13 DIAGNOSIS — D62 Acute posthemorrhagic anemia: Secondary | ICD-10-CM

## 2015-09-13 HISTORY — DX: Acute posthemorrhagic anemia: D62

## 2015-09-13 LAB — CBC
HCT: 22.7 % — ABNORMAL LOW (ref 36.0–46.0)
HCT: 25.2 % — ABNORMAL LOW (ref 36.0–46.0)
Hemoglobin: 7.7 g/dL — ABNORMAL LOW (ref 12.0–15.0)
Hemoglobin: 8.6 g/dL — ABNORMAL LOW (ref 12.0–15.0)
MCH: 31 pg (ref 26.0–34.0)
MCH: 31.4 pg (ref 26.0–34.0)
MCHC: 33.9 g/dL (ref 30.0–36.0)
MCHC: 34.1 g/dL (ref 30.0–36.0)
MCV: 91.5 fL (ref 78.0–100.0)
MCV: 92 fL (ref 78.0–100.0)
PLATELETS: 174 10*3/uL (ref 150–400)
PLATELETS: 221 10*3/uL (ref 150–400)
RBC: 2.48 MIL/uL — ABNORMAL LOW (ref 3.87–5.11)
RBC: 2.74 MIL/uL — ABNORMAL LOW (ref 3.87–5.11)
RDW: 14 % (ref 11.5–15.5)
RDW: 14.3 % (ref 11.5–15.5)
WBC: 18.8 10*3/uL — ABNORMAL HIGH (ref 4.0–10.5)
WBC: 22.7 10*3/uL — AB (ref 4.0–10.5)

## 2015-09-13 MED ORDER — CARBOPROST TROMETHAMINE 250 MCG/ML IM SOLN
250.0000 ug | Freq: Once | INTRAMUSCULAR | Status: DC
  Filled 2015-09-13: qty 1

## 2015-09-13 MED ORDER — LOPERAMIDE HCL 2 MG PO CAPS
4.0000 mg | ORAL_CAPSULE | Freq: Once | ORAL | Status: DC
  Filled 2015-09-13: qty 2

## 2015-09-13 MED ORDER — BISACODYL 10 MG RE SUPP
10.0000 mg | Freq: Once | RECTAL | Status: AC
  Administered 2015-09-13: 10 mg via RECTAL
  Filled 2015-09-13: qty 1

## 2015-09-13 MED ORDER — LOPERAMIDE HCL 2 MG PO CAPS
2.0000 mg | ORAL_CAPSULE | ORAL | Status: DC | PRN
  Administered 2015-09-15: 2 mg via ORAL
  Filled 2015-09-13 (×2): qty 1

## 2015-09-13 MED ORDER — FERUMOXYTOL INJECTION 510 MG/17 ML
510.0000 mg | Freq: Once | INTRAVENOUS | Status: AC
  Administered 2015-09-13: 510 mg via INTRAVENOUS
  Filled 2015-09-13: qty 17

## 2015-09-13 NOTE — Progress Notes (Signed)
Called Dr. Cathie BeamsWolk regarding pt peeing without clots.  Verbal order to hold hemabat and imodium.  Pt c/o of gas pain, giving apple juice/prune juice. Will continue to monitor.

## 2015-09-13 NOTE — Lactation Note (Signed)
This note was copied from a baby's chart. Lactation Consultation Note  Patient Name: Vanessa Rice WUJWJ'XToday's Date: 09/13/2015 Reason for consult: FLucio Edwardollow-up assessment   Follow up with mom of 29 hour old infant. Infant with 5 BF for 10-20 minutes, 2 attempts, 4 voids and 4 stools in last 24 hours. Infant weight 6 lb 7.9 oz with weight loss of 5% since birth.  Mom had infant latched to right breast in football hold when I went into the room. The infant was positioned well and nursing rhythmically with flanged lips and intermittent swallows. Infant skin looks yellow. Enc mom to feed infant sts 8-12 x in 24 hours at first feeding cues, stimulating infant as needed to maintain nutritive suckling. Enc mom to massage/compress breast with feedings.   Did not assess nipples as infant was already latched. Mom denies nipple pain or breast feeling fuller today. Enc her to call with questions/concerns as needed.   Dad to go to store today and get mom's pump. Followup tomorrow.   Maternal Data Formula Feeding for Exclusion: No Does the patient have breastfeeding experience prior to this delivery?: No  Feeding Feeding Type: Breast Fed  LATCH Score/Interventions Latch: Grasps breast easily, tongue down, lips flanged, rhythmical sucking. Intervention(s): Breast massage;Breast compression  Audible Swallowing: A few with stimulation Intervention(s): Hand expression     Comfort (Breast/Nipple): Soft / non-tender     Hold (Positioning): No assistance needed to correctly position infant at breast.     Lactation Tools Discussed/Used [facility]: No   Consult Status Consult Status: Follow-up Date: 09/14/15 Follow-up type: In-patient    Silas FloodSharon S Kariss Longmire 09/13/2015, 1:57 PM

## 2015-09-13 NOTE — Addendum Note (Signed)
Addendum  created 09/13/15 0847 by Algis GreenhouseLinda A Kailey Esquilin, CRNA   Modules edited: Charges VN

## 2015-09-13 NOTE — Progress Notes (Signed)
Subjective: Postpartum Day 1: Cesarean Delivery Patient reports tolerating PO and no problems voiding.    Objective: Vital signs in last 24 hours: Temp:  [97.5 F (36.4 C)-99.3 F (37.4 C)] 98.3 F (36.8 C) (03/14 0630) Pulse Rate:  [81-110] 94 (03/14 0630) Resp:  [17-23] 18 (03/14 0630) BP: (114-154)/(68-95) 129/79 mmHg (03/14 0630) SpO2:  [95 %-99 %] 96 % (03/14 0630)  Physical Exam:  General: alert, cooperative and no distress Lochia: appropriate Uterine Fundus: firm Incision: healing well, no significant drainage, no significant erythema DVT Evaluation: No evidence of DVT seen on physical exam.   Recent Labs  09/12/15 1847 09/13/15 0530  HGB 9.4* 7.7*  HCT 27.5* 22.7*    Assessment/Plan: Status post Cesarean section. Doing well postoperatively.  Continue current care. Monitor Hgb and BP.   Vanessa Rice 09/13/2015, 7:43 AM

## 2015-09-13 NOTE — Progress Notes (Signed)
Pt went to go to bathroom to void.  As she sat down passed lots of clots into measuring hat then voided.  Measured 486g of clots.  Resident called to bedside. VSS. Will cont to monitor. No new orders

## 2015-09-13 NOTE — Progress Notes (Signed)
Post Partum Day 1 Subjective: no complaints, up ad lib, voiding and tolerating PO  Had some dizziness when ambulating last night, but improved today.  Objective: Blood pressure 129/79, pulse 94, temperature 98.3 F (36.8 C), temperature source Oral, resp. rate 18, height 5\' 3"  (1.6 m), weight 251 lb (113.853 kg), last menstrual period 12/20/2014, SpO2 96 %, unknown if currently breastfeeding.  Physical Exam:  General: alert, cooperative and no distress Lochia: appropriate Uterine Fundus: firm Incision: no significant drainage DVT Evaluation: No evidence of DVT seen on physical exam. Negative Homan's sign.   Recent Labs  09/12/15 1847 09/13/15 0530  HGB 9.4* 7.7*  HCT 27.5* 22.7*    Assessment/Plan: Breastfeeding and Lactation consult  Feraheme for anemia Transfuse if becomes symptomatic   LOS: 4 days   Deshonna Trnka JEHIEL 09/13/2015, 7:44 AM

## 2015-09-13 NOTE — Progress Notes (Signed)
Pt BP 150/96 MD notified. No new orders at this time

## 2015-09-13 NOTE — Progress Notes (Signed)
POSTPARTUM PROGRESS NOTE - INTERVAL NOTE  Post Partum Day 1 Subjective:  Vanessa ReichertKatie F Rice is a 30 y.o. G2P1011 1078w0d s/p PLTCS c/b ppH 2 L. This morning got up for first time to urinate and passed approximately 500 cc clots. Mild light-headedness. Lochia otherwise wnl.   Objective: Blood pressure 140/66, pulse 103, temperature 98.3 F (36.8 C), temperature source Oral, resp. rate 18, height 5\' 3"  (1.6 m), weight 251 lb (113.853 kg), last menstrual period 12/20/2014, SpO2 96 %, unknown if currently breastfeeding.  Physical Exam:  General: alert, cooperative and no distress Lochia:normal flow Chest: CTAB Heart: RRR no m/r/g Abdomen: incision c/d/i, no significant abdominal tenderness Uterine Fundus: firm, DVT Evaluation: No calf swelling or tenderness Extremities: trace edema   Recent Labs  09/12/15 1847 09/13/15 0530  HGB 9.4* 7.7*  HCT 27.5* 22.7*    Assessment/Plan:  ASSESSMENT: Vanessa Rice is a 30 y.o. G2P1011 3578w0d s/p pltcs c/b pph. Passage of 500 cc clot this morning but suspect old bleeding as fundus firm and since this morning lochia normal and has urinated twice since without passage of significant blood. Will check CBC this afternoon. Has received feraheme. Possible transfusion - patient consented.    LOS: 4 days   Silvano Bilisoah B Lesia Monica 09/13/2015, 12:39 PM

## 2015-09-13 NOTE — Lactation Note (Addendum)
This note was copied from a baby's chart. Lactation Consultation Note  Patient Name: Vanessa Lucio EdwardKatie Gurr ZOXWR'UToday's Date: 09/13/2015 Reason for consult: Follow-up assessment (maternal PPH)   Follow up with mom as RN is concerned that her Hgb has dropped lower and infant is showing signs of Jaundice. Spoke with mom in regards to pumping and supplementing. Mom agreeable to supplementation if in the best interest of Vanessa Rice. Mom reports + breast changes with pregnancy. Mom is not feeling well and is very nauseous at this time.   Discussed importance of frequent BF and pumping to stimulate supply. Mom has PIS at bedside, enc her to use Hospital Grade pump at this time for Initiate setting, mom agreeable.   Infant was feeding when I went into room. He was rhythmically sucking with a few intermittent swallows. Parents voiced that infant has been angry with latching and nurse has to assist to get infant latched.  Symphony DEBP Set up with instructions for set up, cleaning and pumping. Mom pumped for 15 minutes on Initiate setting, small gtts were seen in flanges post pumping. Gtts were rubbed in Vanessa Rice's mouth. We hand expressed mom on both sides, received 2 big gtts from each side. Mom was able to return hand expression demo. Given to [place] in spoon, showed parents how to spoon feed and finger feed with curved tip syringe. Finger fed Vanessa Rice 4 cc Alimentum . He had a hard time forming a seal and taking in supplement.  Discussed with parents supplementing Vanessa Rice via spoon, syringe, or 5 fr feeding tube at breast. Parents chose spoon feeding, Vanessa Rice did not do as well with spoon feeding so changed to curved tip syringe.  Supplementation Guidelines given and explained to parents with instructions to increase amounts per day of age.   Update given to mom's RN after feeding.  Plan: BF infant STS at breast 8-12 x in 24 hours at first feeding cues, use awakening techniques as needed Supplement infant with  EBM/Formula post BF per supplemental Guideline Handout Pump both breasts for 15 minutes with DEBP on Initiate setting every 3 hours Hand Express post pumping and give all EBM to infant with next feeding Call for assistance as needed.  Will follow up tomorrow and prn     Maternal Data Formula Feeding for Exclusion: No Has patient been taught Hand Expression?: Yes Does the patient have breastfeeding experience prior to this delivery?: No  Feeding Feeding Type: Breast Fed Length of feed: 20 min  LATCH Score/Interventions Latch: Grasps breast easily, tongue down, lips flanged, rhythmical sucking. Intervention(s): Breast compression;Breast massage  Audible Swallowing: A few with stimulation Intervention(s): Skin to skin;Hand expression Intervention(s): Skin to skin;Alternate breast massage  Type of Nipple: Everted at rest and after stimulation  Comfort (Breast/Nipple): Soft / non-tender     Hold (Positioning): No assistance needed to correctly position infant at breast.  LATCH Score: 9  Lactation Tools Discussed/Used WIC Program: No Pump Review: Setup, frequency, and cleaning;Milk Storage Initiated by:: Noralee StainSharon Anapaula Severt, RN, IBCLC Date initiated:: 09/13/15   Consult Status Consult Status: Follow-up Date: 09/14/15 Follow-up type: In-patient    Silas FloodSharon S Malary Aylesworth 09/13/2015, 5:16 PM

## 2015-09-14 NOTE — Lactation Note (Signed)
This note was copied from a baby's chart. Lactation Consultation Note  Patient Name: Vanessa Lucio EdwardKatie Esch UJWJX'BToday's Date: 09/14/2015 Reason for consult: Follow-up assessment Mom reports baby is nursing well, denies questions/concerns. She has pumped few times today, supplement[facility] with some formula [facility] and low Hbg after delivery. LC advised to continue to BF with each feeding, 8-12 times or more in 24 hours. Post pump 4-6 times during the day to encourage milk production and maximize milk production. Give baby back any amount of EBM received. Call for questions/concerns. Offered latch assist with next feeding. Mom will advise if she wants assistance.   Maternal Data    Feeding    LATCH Score/Interventions                      Lactation Tools Discussed/Used Tools: Pump Breast pump type: Double-Electric Breast Pump   Consult Status Consult Status: Follow-up Date: 09/15/15 Follow-up type: In-patient    Alfred LevinsGranger, Francee Setzer Ann 09/14/2015, 3:39 PM

## 2015-09-14 NOTE — Progress Notes (Signed)
Subjective: Postpartum Day 2: Cesarean Delivery Patient reports nausea, vomiting, tolerating PO, + flatus, + BM and no problems voiding.   The nausea started last night, with one episode of non-bilious, non-bloody vomiting at bedside. The patient reports two more episodes of "dry heaving in the bathroom" last night. The patient currently reports no nausea. She has had success controlling her symptoms with Zofran, last dose administered at 1800 yesterday. The patient's gas pressure and constipation has resolved with Senokot-S and simethicone administered at midnight. The patient reports two bowel movements since. Additionally, the patient reports no longer passing clots with voiding, endorsing scant bleeding with wiping and 1 pad usage.  Objective: Vital signs in last 24 hours: Temp:  [97.9 F (36.6 C)-98.2 F (36.8 C)] 98.2 F (36.8 C) (03/15 0620) Pulse Rate:  [102-108] 107 (03/15 0620) Resp:  [18-20] 20 (03/15 0620) BP: (140-158)/(66-103) 147/86 mmHg (03/15 0620) SpO2:  [96 %] 96 % (03/15 0620)  Physical Exam:  General: alert, cooperative and no distress Lochia: appropriate Uterine Fundus: firm Incision: healing well, no significant drainage, no significant erythema DVT Evaluation: No evidence of DVT seen on physical exam. Negative Homan's sign. No cords or calf tenderness. No significant calf/ankle edema.   Recent Labs  09/13/15 0530 09/13/15 1644  HGB 7.7* 8.6*  HCT 22.7* 25.2*    Assessment/Plan: Status post Cesarean section. Doing well postoperatively.  Monitor BP q4h Continue to monitor Hgb. Notify attending if <8.0. Continue current care.  Cephus ShellingAndrew P Minahil Quinlivan PA-S 09/14/2015, 7:40 AM

## 2015-09-15 MED ORDER — OXYCODONE-ACETAMINOPHEN 5-325 MG PO TABS: 1.0000 | ORAL_TABLET | ORAL | Status: DC | PRN

## 2015-09-15 NOTE — Discharge Instructions (Signed)

## 2015-09-15 NOTE — Lactation Note (Addendum)
This note was copied from a baby's chart. Lactation Consultation Note  Patient Name: Vanessa Rice HYQMV'HToday's Date: 09/15/2015 Reason for consult: Follow-up assessment;Hyperbilirubinemia;Infant weight loss   Follow up with parents and 3073 hour old infant. [name]. Infant with 7 BF for 10-30 minutes, 6 syringe feeds of 12-47 cc, 2 voids and 3 stools in last 24 hours. LATCH Scores 6-9 by bedside RN.  Mom reports infant is feeding well and was cluster feeding during the night. He is sleepy at times, and parents are using awakening techniques as needed. Mom denies nipple pain or tenderness. Mom says she has not noticed breast changes since delivery. Discussed with mom to continue pumping post BF to stimulate supply. Mom reports she is pumping 5-6 x a day and infant cluster fed last night. Mom has PIS for home use. Mom is aware of high likelihood of delayed Lactogenesis due [facility].    Discussed with mom that infants supplemental feeds to 8 x a day and need to increase amounts per day of life. Formula Supplementation Guidelines given and reviewed with parents. Used higher supplementation guidelines for strictly formula fed infants due to weight loss, delayed Lactogenesis and Hyperbilirubanemia. Infant with follow up Ped appt tomorrow.   Discussed with parents that since volume of feeds are increasing it may be the best time to switch to bottles for supplement. Dad reports infant is not taking feeding as well with syringe at this time. Mom has wide based nipples that she plans to use at home.   All BF information reviewed in Taking Care of Baby and Me Booklet. Engorgement prevention/treatment reviewed. I/O reviewed, enc parents to keep feeding log and take to Ped visit.   Offered mom OP LC appt. She declined at this time. She has LC Brochure, she is aware of OP Services, BF Support Groups, and LC Phone #. Enc parents to call LC with questions/concerns.    Maternal Data Formula Feeding for Exclusion:  No Has patient been taught Hand Expression?: Yes Does the patient have breastfeeding experience prior to this delivery?: No  Feeding Feeding Type: Formula  LATCH Score/Interventions                      Lactation Tools Discussed/Used Pump Review: Setup, frequency, and cleaning;Milk Storage   Consult Status Consult Status: Complete Follow-up type: Call as needed    Ed BlalockSharon S Jenness Stemler 09/15/2015, 10:48 AM

## 2015-09-15 NOTE — Discharge Summary (Signed)
OB Discharge Summary     Patient Name: Vanessa Rice DOB: [birth year] MRN: [id number]  Date of admission: 09/09/2015 Delivering MD: [name]   Date of discharge: 09/15/2015  Admitting diagnosis: 37w induction Intrauterine pregnancy: [redacted]w[redacted]d     Secondary diagnosis:  Active Problems:   Mild preeclampsia   S/P cesarean section   Acute blood loss anemia  Additional problems:  Patient Active Problem List   Diagnosis Date Noted  . Acute blood loss anemia 09/13/2015  . S/P cesarean section 09/12/2015  . Mild preeclampsia 09/09/2015  . Supervision of normal pregnancy 01/28/2015  . Complete miscarriage 09/13/2014       Discharge diagnosis: Term Pregnancy Delivered, Preeclampsia (mild) and PPH                                                                                                Post partum procedures:Receieved Pit andCytotec for PPH of approximately 2000cc of EBL  Augmentation: Cytotec  Complications: Failed IOL for preeclampsia without severe features, pLTCS for arrest of descent.  Hospital course:  Induction of Labor With Cesarean Section  30 y.o. yo G2P1011 at [redacted]w[redacted]d was admitted to the hospital 09/09/2015 for induction of labor. Patient had a labor course significant for arrest of descent. The patient went for cesarean section due to Arrest of Descent, and delivered a Viable infant. Membrane Rupture Time/Date: )9:00 AM ,09/11/2015  .Details of operation can be found in separate operative Note.  Patient had a PPH of ~1500 cc on PPD 0 (was given Cytotec and Pit) and then another 500cc on PPD1 (was given Feraheme). Her blood pressures continued to be intermittently elevated (140-150 systolic) with a tachycardic pulse (100-110s). She is ambulating, tolerating a regular diet, passing flatus, and urinating well. Denies lightheadedness, headache, shortness of breath, or palpitations. Patient is discharged home in stable condition on 09/15/2015. [facility] her [facility]                          Physical exam  Filed Vitals:   09/13/15 2005 09/14/15 0620 09/14/15 1720 09/15/15 0555  BP: 150/96 147/86 151/79 136/78  Pulse: 102 107 111 107  Temp:  98.2 F (36.8 C) 98.5 F (36.9 C) 98 F (36.7 C)  TempSrc:  Oral Oral Oral  Resp:  Height:      Weight:      SpO2:  96%  96%   General: alert, cooperative and no distress Lochia: appropriate Uterine Fundus: firm Incision: Healing well with no significant drainage, No significant erythema, Dressing is clean, dry, and intact DVT Evaluation: Negative Homan's sign. No cords or calf tenderness. No significant calf/ankle edema. Labs: Lab Results  Component Value Date   WBC 22.7* 09/13/2015   HGB 8.6* 09/13/2015   HCT 25.2* 09/13/2015   MCV 92.0 09/13/2015   PLT 221 09/13/2015   CMP Latest Ref Rng 09/09/2015  Glucose 65 - 99 mg/dL 78  BUN 6 - 20 mg/dL 11  Creatinine 4.09 - 8.11 mg/dL 9.14  Sodium 135 - 145 mmol/L 137  Potassium 3.5 - 5.1 mmol/L 4.5  Chloride 101 - 111 mmol/L 109  CO2 22 - 32 mmol/L 23  Calcium 8.9 - 10.3 mg/dL 1.6(X8.7(L)  Total Protein 6.5 - 8.1 g/dL 0.9(U5.7(L)  Total Bilirubin 0.3 - 1.2 mg/dL 0.4  Alkaline Phos 38 - 126 U/L 114  AST 15 - 41 U/L 31  ALT 14 - 54 U/L 23    Discharge instruction: per After Visit Summary and "Baby and Me Booklet".  After visit meds:    Medication List    STOP taking these medications        PRE-NATAL PO      TAKE these medications        oxyCODONE-acetaminophen 5-325 MG tablet  Commonly known as:  PERCOCET  Take 1 tablet by mouth every 4 (four) hours as needed for severe pain.        Diet: routine diet  Activity: Advance as tolerated. Pelvic rest for 6 weeks.   Outpatient follow up:2 weeks Follow up Appt:Future Appointments Date Time Provider Department Center  09/19/2015 10:45 AM Lazaro ArmsLuther H Eure, MD [facility]   Follow up Visit:No Follow-up on file.  Postpartum contraception:  Combination OCPs  Newborn Data: Live born female  Birth Weight: 6 lb 13 oz (3090 g) APGAR: 9, 9  Baby Feeding: Breast Disposition:home with mother   09/15/2015 Beaulah Dinninghristina M Gambino, MD  OB fellow attestation I have seen and examined this patient and agree with above documentation in the resident's note.   Armando ReichertKatie F Coor is a 30 y.o. G2P1011 s/p pLTCS for arrest of dilation.   Pain is well controlled.  Plan for birth control is no method, oral contraceptives (estrogen/progesterone).  Method of Feeding: Breast  PE:  BP 136/78 mmHg  Pulse 107  Temp(Src) 98 F (36.7 C) (Oral)  Resp 20  Ht 5\' 3"  (1.6 m)  Wt 251 lb (113.853 kg)  BMI 44.47 kg/m2  SpO2 96%  LMP 12/20/2014  Breastfeeding? Unknown Gen: well appearing Heart: reg rate Lungs: normal WOB Fundus firm Ext: soft, no pain, no edema   Recent Labs  09/13/15 0530 09/13/15 1644  HGB 7.7* 8.6*  HCT 22.7* 25.2*   Plan: discharge today - postpartum care discussed - patient with PPH 2000mL, received cytotec, pitocin and hemabate. Additionally received feraheme - f/u clinic in 2 weeks for wound check and follow up sx of anemia - f/u 6 weeks for pp visit  Federico FlakeKimberly Niles Parisha Beaulac, MD 2:30 PM

## 2015-09-19 ENCOUNTER — Ambulatory Visit: Payer: 59 | Admitting: Obstetrics & Gynecology

## 2015-09-19 ENCOUNTER — Encounter: Payer: Self-pay | Admitting: Obstetrics & Gynecology

## 2015-09-19 VITALS — BP 154/100 | HR 84 | Ht 63.0 in | Wt 236.5 lb

## 2015-09-19 DIAGNOSIS — Z9889 Other specified postprocedural states: Secondary | ICD-10-CM

## 2015-09-19 MED ORDER — AMLODIPINE BESYLATE 10 MG PO TABS: 10.0000 mg | ORAL_TABLET | Freq: Every day | ORAL | Status: DC

## 2015-09-19 NOTE — Progress Notes (Signed)
Patient ID: Armando ReichertKatie F Check, female   DOB: Jun 15, 1986, 30 y.o.   MRN: [id number]  HPI: Patient returns for routine postoperative follow-up having undergone primary caesarean section on 09/12/2015.  The patient's immediate postoperative recovery has been unremarkable. Since hospital discharge the patient reports swelling.   Current Outpatient Prescriptions: ibuprofen (ADVIL,MOTRIN) 600 MG tablet, Take 600 mg by mouth 3 (three) times daily., Disp: , Rfl:  oxyCODONE-acetaminophen (PERCOCET) 5-325 MG tablet, Take 1 tablet by mouth every 4 (four) hours as needed for severe pain. (Patient not taking: Reported on 09/19/2015), Disp: 30 tablet, Rfl: 0  No current facility-administered medications for this visit.    Blood pressure 154/100, pulse 84, height 5\' 3"  (1.6 m), weight 236 lb 8 oz (107.276 kg), last menstrual period 12/20/2014, currently breastfeeding.  Physical Exam: Incision clean dry intact 3+ edema  Diagnostic Tests: none  Pathology: na  Impression: S/P primary Caesarean section for asynclitism  Plan: Begin amlodipine Follow up 2 weeks BP check  Follow up: 2  weeks  Lazaro ArmsEURE,LUTHER H, MD

## 2015-09-22 DIAGNOSIS — Z029 Encounter for administrative examinations, unspecified: Secondary | ICD-10-CM

## 2015-09-28 NOTE — Progress Notes (Signed)
Subjective: Postpartum Day 2: Cesarean Delivery Patient reports nausea, vomiting, tolerating PO, + flatus, + BM and no problems voiding.  The nausea started last night, with one episode of non-bilious, non-bloody vomiting at bedside. The patient reports two more episodes of "dry heaving in the bathroom" last night. The patient currently reports no nausea. She has had success controlling her symptoms with Zofran, last dose administered at 1800 yesterday. The patient's gas pressure and constipation has resolved with Senokot-S and simethicone administered at midnight. The patient reports two bowel movements since. Additionally, the patient reports no longer passing clots with voiding, endorsing scant bleeding with wiping and 1 pad usage.  Objective: Vital signs in last 24 hours: Temp: [97.9 F (36.6 C)-98.2 F (36.8 C)] 98.2 F (36.8 C) (03/15 0620) Pulse Rate: [102-108] 107 (03/15 0620) Resp: [18-20] 20 (03/15 0620) BP: (140-158)/(66-103) 147/86 mmHg (03/15 0620) SpO2: [96 %] 96 % (03/15 0620)  Physical Exam:  General: alert, cooperative and no distress Lochia: appropriate Uterine Fundus: firm Incision: healing well, no significant drainage, no significant erythema DVT Evaluation: No evidence of DVT seen on physical exam. Negative Homan's sign. No cords or calf tenderness. No significant calf/ankle edema.   Recent Labs (last 2 labs)      Recent Labs  09/13/15 0530 09/13/15 1644  HGB 7.7* 8.6*  HCT 22.7* 25.2*      Assessment/Plan: Status post Cesarean section. Doing well postoperatively.  Monitor BP q4h Continue to monitor Hgb. Notify attending if <8.0. Continue current care.       Lawson FiscalLori Rayansh Herbst CNM 09/14/15

## 2015-10-03 ENCOUNTER — Ambulatory Visit (INDEPENDENT_AMBULATORY_CARE_PROVIDER_SITE_OTHER): Payer: 59 | Admitting: Obstetrics & Gynecology

## 2015-10-03 ENCOUNTER — Encounter: Payer: Self-pay | Admitting: Obstetrics & Gynecology

## 2015-10-03 VITALS — BP 120/80 | HR 80 | Wt 214.0 lb

## 2015-10-03 DIAGNOSIS — O165 Unspecified maternal hypertension, complicating the puerperium: Secondary | ICD-10-CM | POA: Diagnosis not present

## 2015-10-03 NOTE — Progress Notes (Signed)
Patient ID: Armando ReichertKatie F Meals, female   DOB: 01-21-1986, 30 y.o.   MRN: [id number] Chief Complaint  Patient presents with  . Follow-up    check blood pressure    Blood pressure 120/80, pulse 80, weight 214 lb (97.07 kg), currently breastfeeding.  BP at home running 120s/80s, swelling is getting better and she feel much better  Will do repeat Caesarean for next pregnancy and take baby Aspirin beginning at 12 weeks  1   Face to face time:  10 minutes  Greater than 50% of the visit time was spent in counseling and coordination of care with the patient.  The summary and outline of the counseling and care coordination is summarized in the note above.   All questions were answered.

## 2015-10-19 ENCOUNTER — Encounter: Payer: Self-pay | Admitting: Advanced Practice Midwife

## 2015-10-19 ENCOUNTER — Ambulatory Visit (INDEPENDENT_AMBULATORY_CARE_PROVIDER_SITE_OTHER): Payer: 59 | Admitting: Advanced Practice Midwife

## 2015-10-19 VITALS — BP 120/80 | HR 92 | Ht 63.0 in | Wt 216.0 lb

## 2015-10-19 DIAGNOSIS — Z98891 History of uterine scar from previous surgery: Secondary | ICD-10-CM

## 2015-10-19 MED ORDER — NORETHINDRONE 0.35 MG PO TABS: 1.0000 | ORAL_TABLET | Freq: Every day | ORAL | Status: DC

## 2015-10-19 NOTE — Progress Notes (Signed)
  Vanessa Rice is a 30 y.o. who presents for a postpartum visit. She is 5 weeks postpartum following a low cervical transverse Cesarean section. I have fully reviewed the prenatal and intrapartum course. The delivery was at 38 gestational weeks. She had IOL for mild preeclampsia, had arrest of dilation/descent at 9cms, baby was OP. Anesthesia: epidural. Postpartum course has been complicated by persistent HTN.  She was placed on Norvasc 10mg .  She did not take her meds today. . Baby's course has been uneventful. Baby is feeding by pumped milk and formula. Bleeding: no bleeding. Bowel function is normal. Bladder function is normal. Patient is not sexually active. Contraception method is none. Postpartum depression screening: negative.   Current outpatient prescriptions:  .  amLODipine (NORVASC) 10 MG tablet, Take 1 tablet (10 mg total) by mouth daily., Disp: 30 tablet, Rfl: 11 .  ibuprofen (ADVIL,MOTRIN) 600 MG tablet, Take 600 mg by mouth 3 (three) times daily., Disp: , Rfl:   Review of Systems   Constitutional: Negative for fever and chills Eyes: Negative for visual disturbances Respiratory: Negative for shortness of breath, dyspnea Cardiovascular: Negative for chest pain or palpitations  Gastrointestinal: Negative for vomiting, diarrhea and constipation Genitourinary: Negative for dysuria and urgency Musculoskeletal: Negative for back pain, joint pain, myalgias  Neurological: Negative for dizziness and headaches   Objective:    There were no vitals filed for this visit. General:  alert, cooperative and no distress   Breasts:  negative  Lungs: clear to auscultation bilaterally  Heart:  regular rate and rhythm  Abdomen: Soft, nontender.  Left area of incision treated w/silver nitrate in tiny area   Vulva:  normal  Vagina: normal vagina  Cervix:  closed  Corpus: Well involuted     Rectal Exam: no hemorrhoids        Assessment:    normal postpartum exam. Preeclampisa, resolved.   No need to restart BP meds.  Will take it at home for a little while  Plan:    1. Contraception: oral progesterone-only contraceptive 2. Follow up in: as needed.

## 2015-12-12 DIAGNOSIS — J029 Acute pharyngitis, unspecified: Secondary | ICD-10-CM | POA: Diagnosis not present

## 2015-12-16 DIAGNOSIS — J029 Acute pharyngitis, unspecified: Secondary | ICD-10-CM | POA: Diagnosis not present

## 2015-12-29 ENCOUNTER — Telehealth: Payer: Self-pay | Admitting: Advanced Practice Midwife

## 2015-12-29 ENCOUNTER — Other Ambulatory Visit: Payer: Self-pay | Admitting: Advanced Practice Midwife

## 2015-12-29 MED ORDER — NORETHIN ACE-ETH ESTRAD-FE 1-20 MG-MCG(24) PO CHEW: 1.0000 | CHEWABLE_TABLET | Freq: Every day | ORAL | Status: DC

## 2015-12-29 NOTE — Telephone Encounter (Signed)
Pt states no longer breastfeeding taking Micronor for BCP would like to switch due to having period every 2 weeks.

## 2015-12-29 NOTE — Telephone Encounter (Signed)
Minastrin sent to pharmacy  May start today or tomorrow, no interruption in birth control

## 2015-12-29 NOTE — Progress Notes (Unsigned)
Quit breastfeeding, bleeds on micronoir.  Rx Minastrin

## 2016-01-04 MED FILL — MIBELAS 24 FE CHEWABLE TAB: 1-20 | 28 days supply | Qty: 28 | Fill #0

## 2016-02-06 MED FILL — MIBELAS 24 FE CHEWABLE TAB: 1-20 | 84 days supply | Qty: 84 | Fill #1

## 2016-05-01 MED FILL — MIBELAS 24 FE CHEWABLE TAB: 1-20 | 84 days supply | Qty: 84 | Fill #2 | Status: TO

## 2016-05-15 DIAGNOSIS — Z0000 Encounter for general adult medical examination without abnormal findings: Secondary | ICD-10-CM | POA: Diagnosis not present

## 2016-05-15 DIAGNOSIS — Z6841 Body Mass Index (BMI) 40.0 and over, adult: Secondary | ICD-10-CM | POA: Diagnosis not present

## 2016-05-15 IMAGING — US US OB TRANSVAGINAL
1 series · 14 of 28 positions shown · non-contrast
Comparison: Sonography from earlier the same day.

CLINICAL DATA: Vaginal bleeding since yesterday.

EXAM:
OBSTETRIC <14 WK US AND TRANSVAGINAL OB US
TECHNIQUE: Both transabdominal and transvaginal ultrasound examinations were
performed for complete evaluation of the gestation as well as the
maternal uterus, adnexal regions, and pelvic cul-de-sac.
Transvaginal technique was performed to assess early pregnancy.

[Series 1: us ob transvaginal · 14 of 44 slices shown]
[im 2/44]
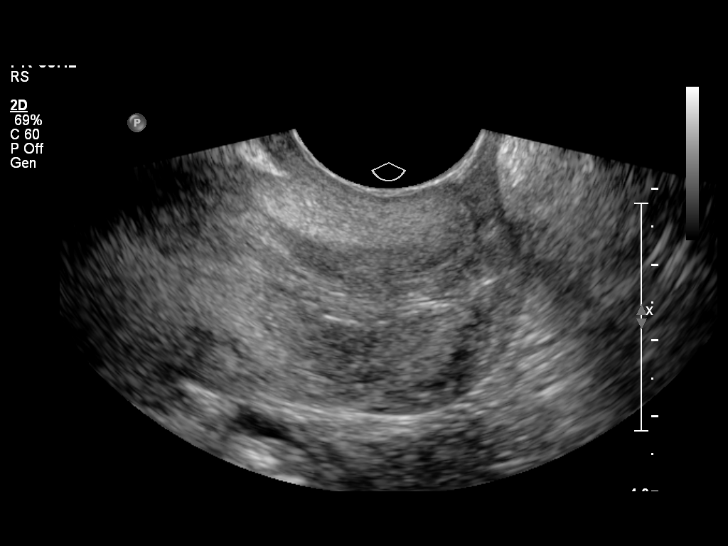
[im 5/44]
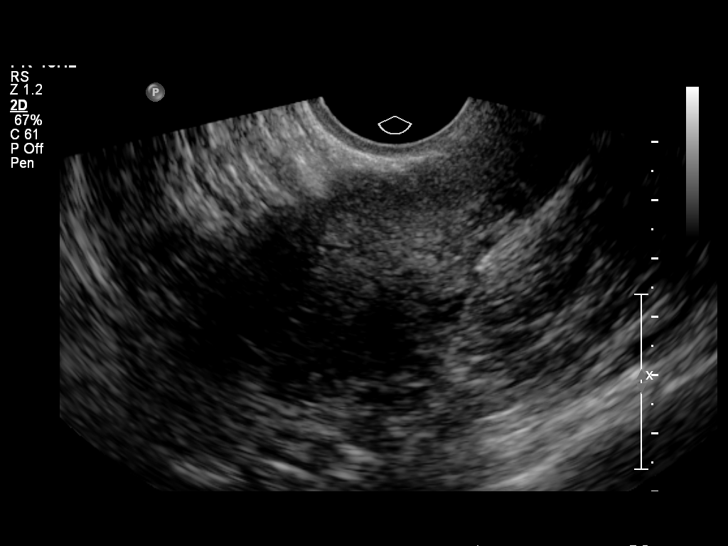
[im 8/44]
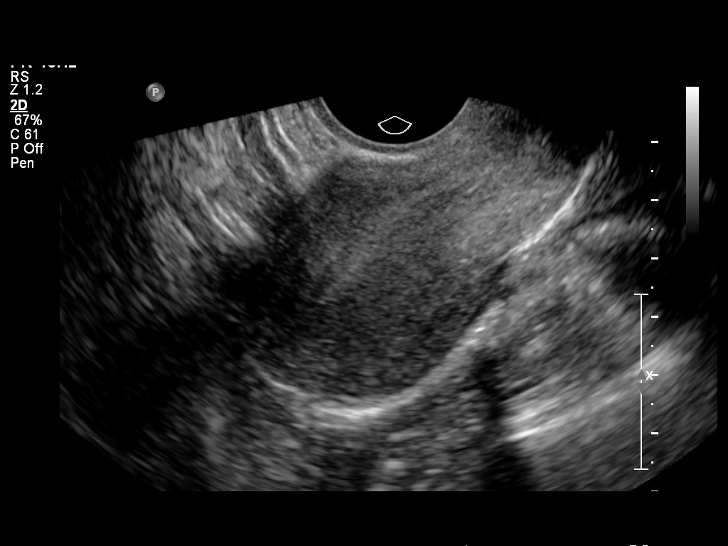
[im 12/44]
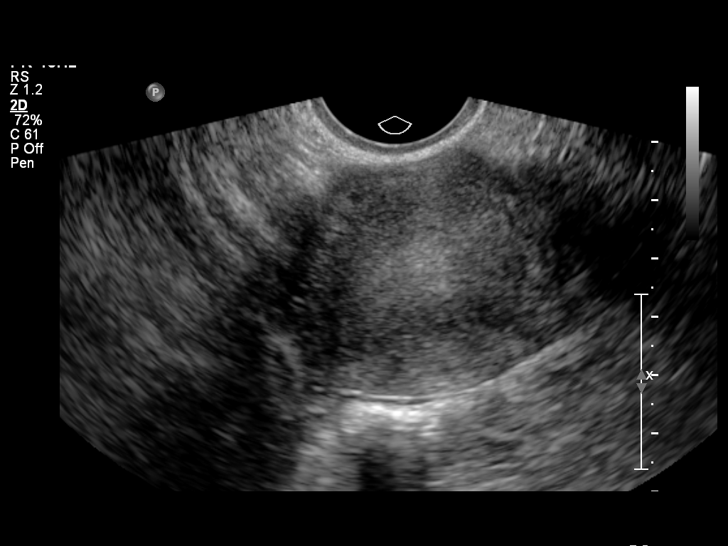
[im 15/44]
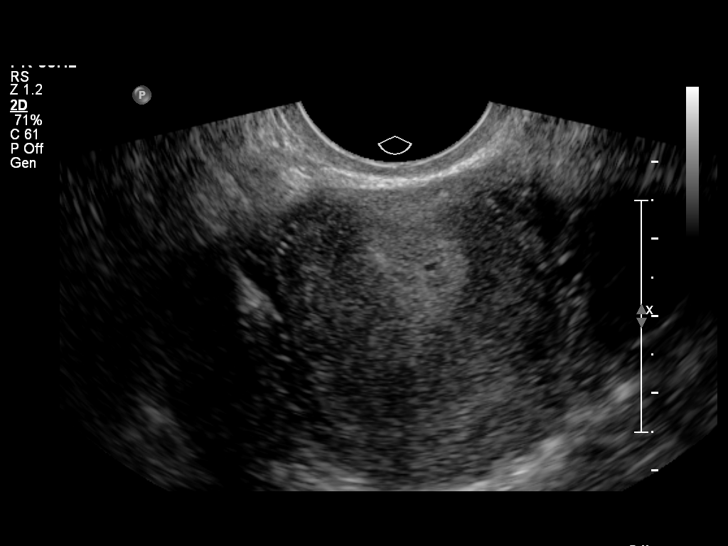
[im 18/44]
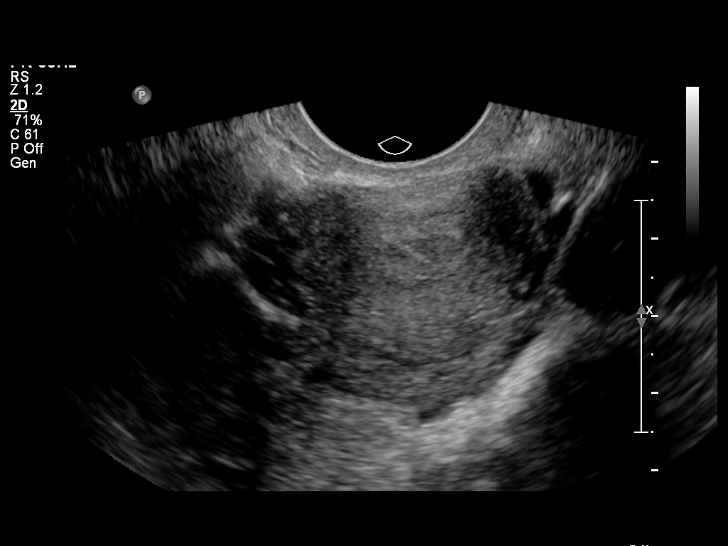
[im 21/44]
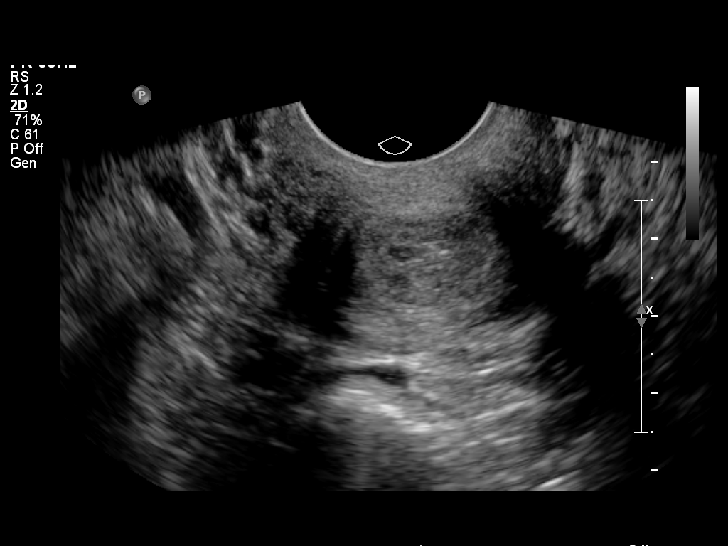
[im 24/44]
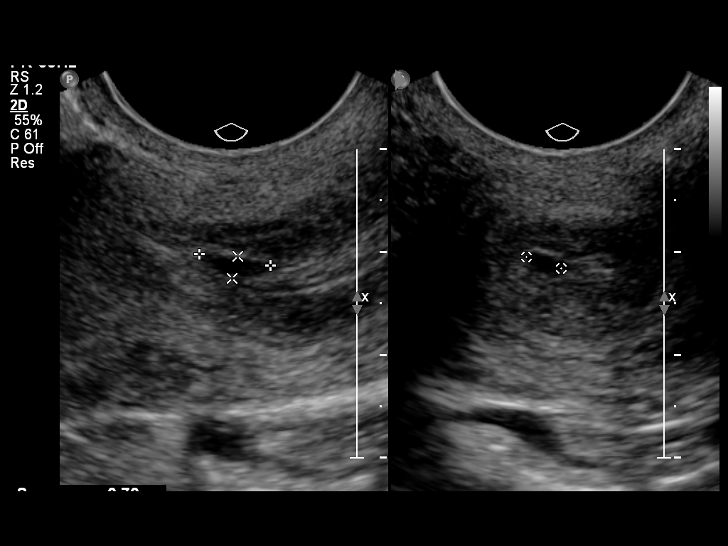
[im 28/44]
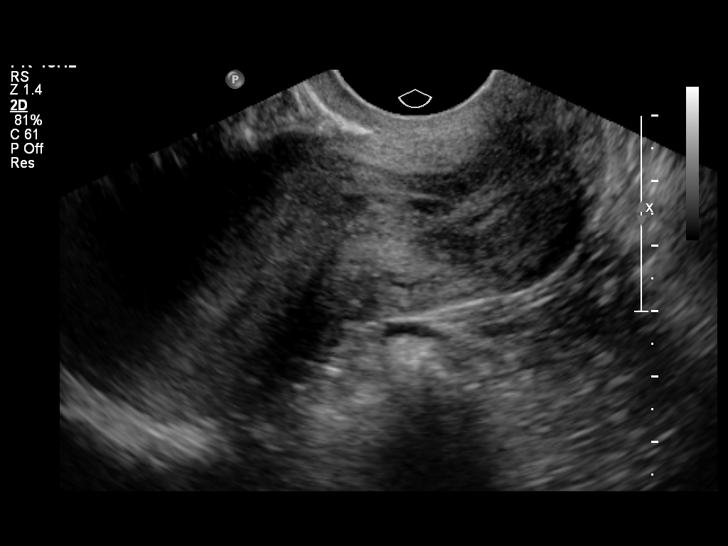
[im 31/44]
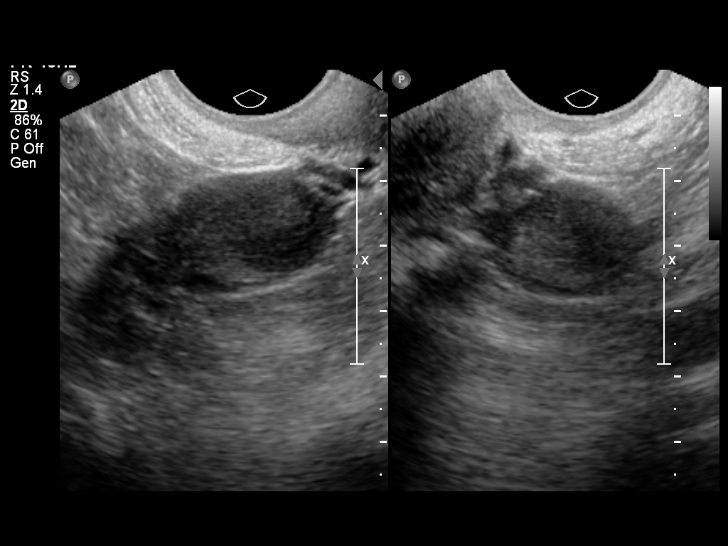
[im 34/44]
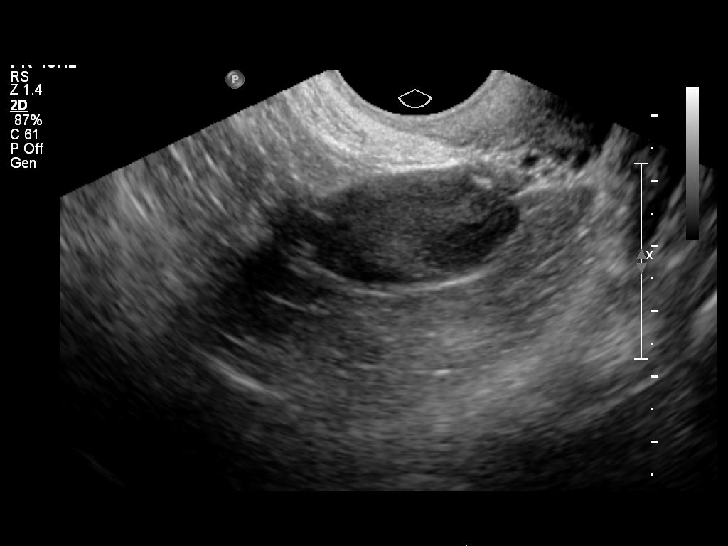
[im 37/44]
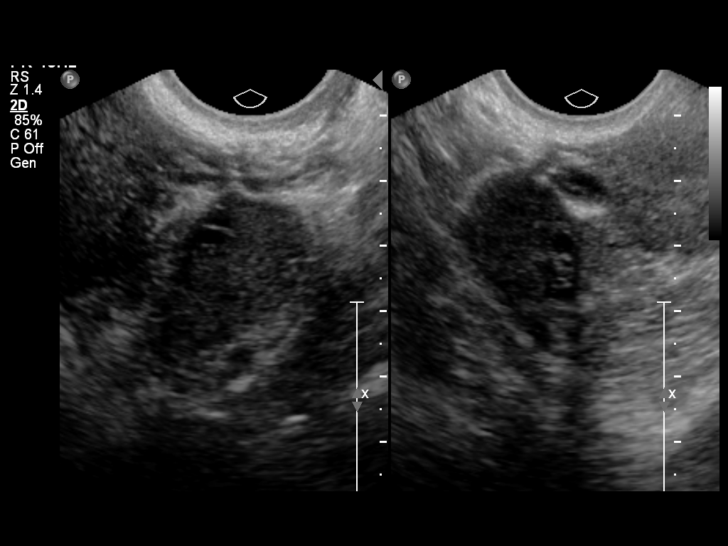
[im 40/44]
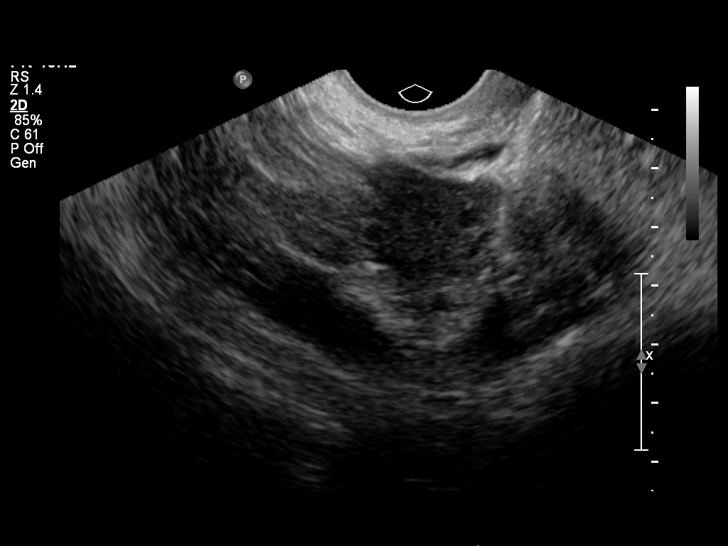
[im 44/44]
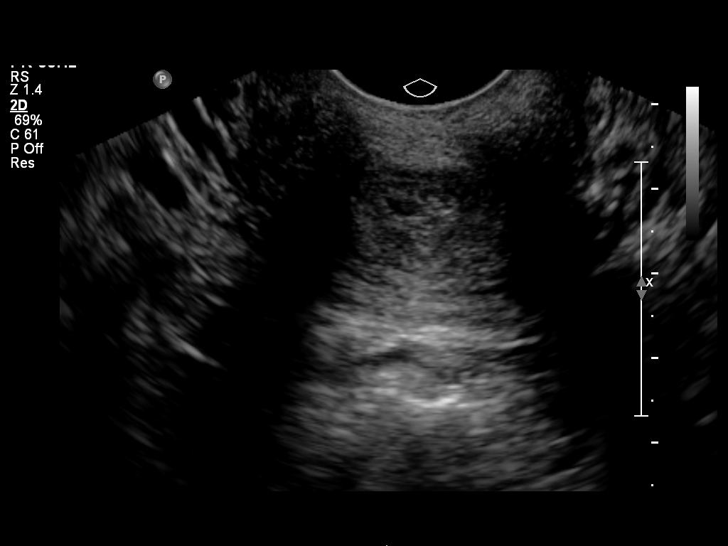

[14 of 28 positions shown; findings below may reference images not displayed]

FINDINGS: The previously noted gestational sac has migrated into the cervix,
consistent with abortion in progress. The yolk sac is no longer
visible. No focal endometrial hematoma.

The ovaries have a normal/physiologic appearance. No free pelvic
fluid.
IMPRESSION: Abortion in progress with gestational sac located in the
endocervical canal.

## 2016-05-15 IMAGING — US US OB COMP LESS 14 WK
1 series · 14 of 28 positions shown · non-contrast
Comparison: None.

CLINICAL DATA: Vaginal bleeding and first-trimester pregnancy, with
abdominal pain

EXAM:
OBSTETRIC <14 WK US AND TRANSVAGINAL OB US
TECHNIQUE: Both transabdominal and transvaginal ultrasound examinations were
performed for complete evaluation of the gestation as well as the
maternal uterus, adnexal regions, and pelvic cul-de-sac.
Transvaginal technique was performed to assess early pregnancy.

[Series 1: us ob comp less 14 wk · 0.20mm/px · 14 of 122 slices shown]
[im 5/122]
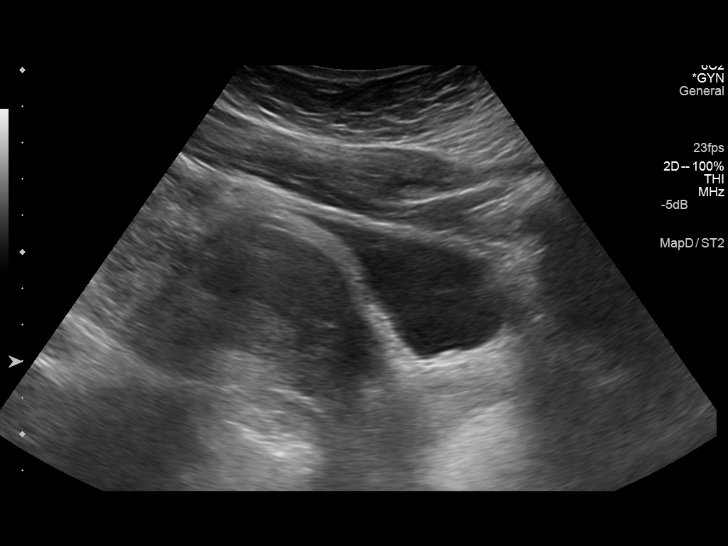
[im 14/122]
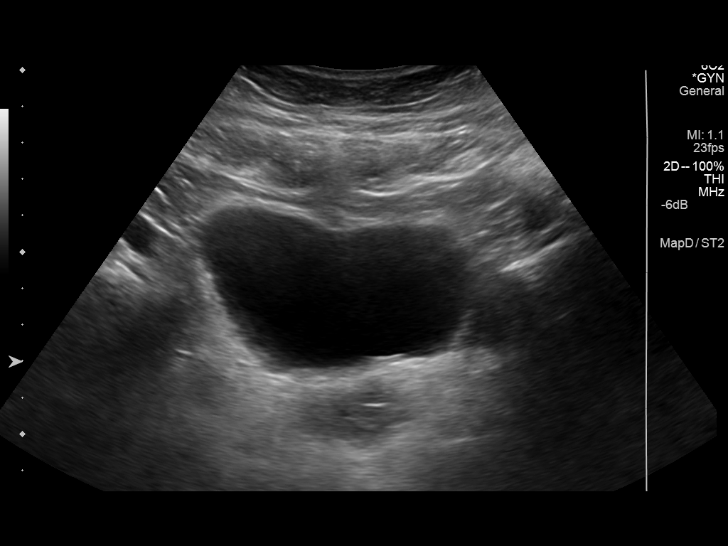
[im 23/122]
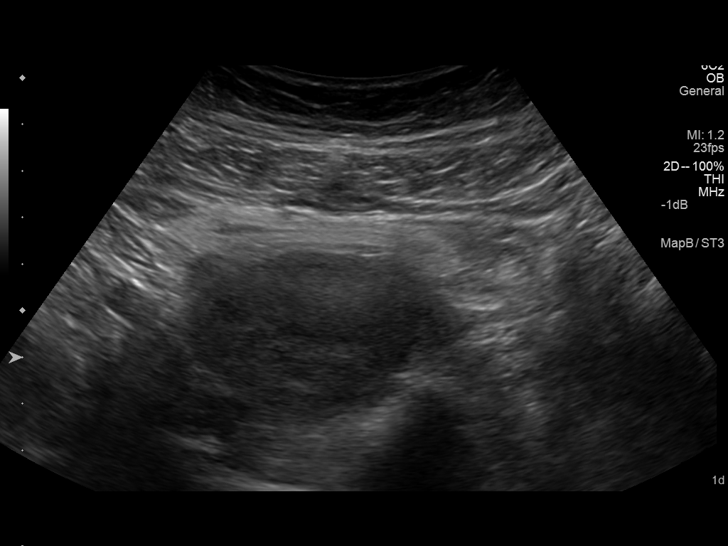
[im 32/122]
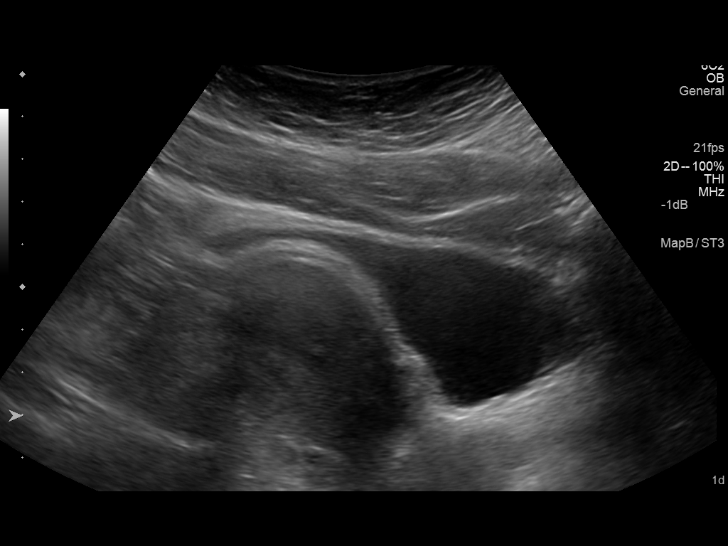
[im 41/122]
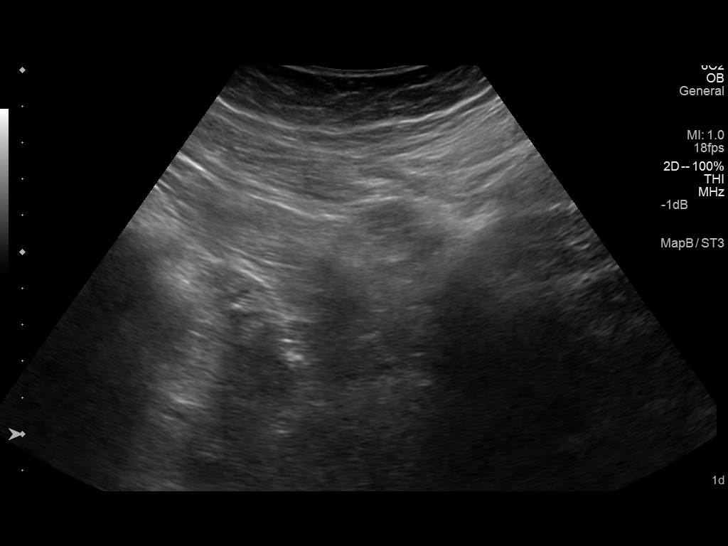
[im 50/122]
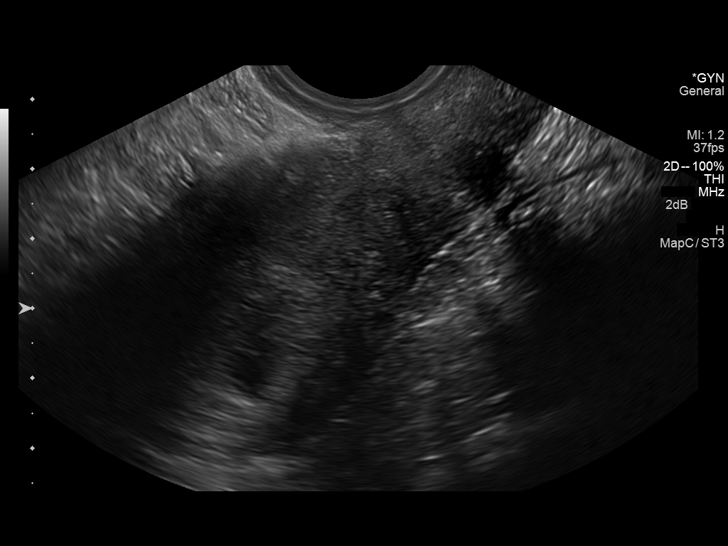
[im 59/122]
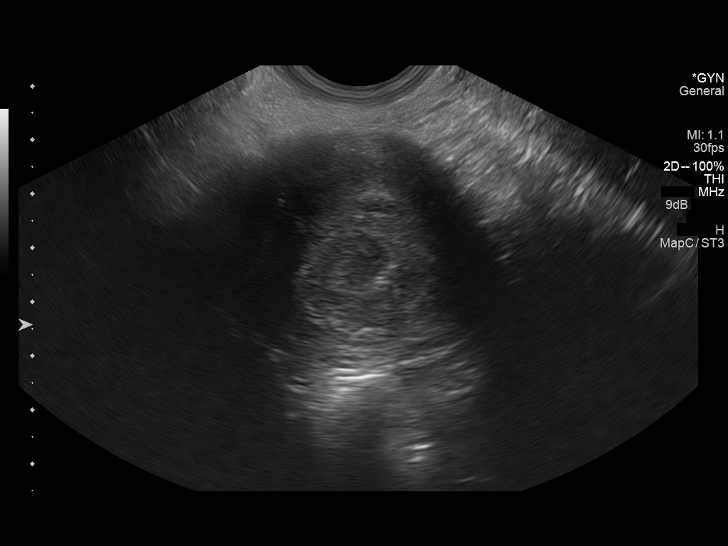
[im 68/122]
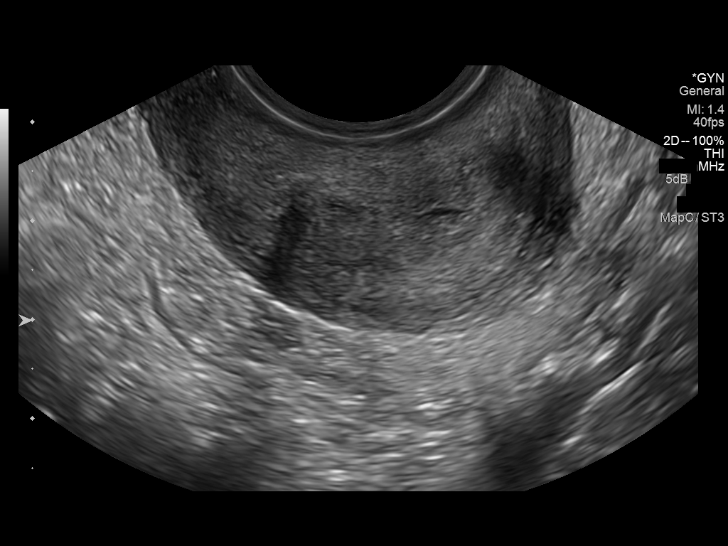
[im 77/122]
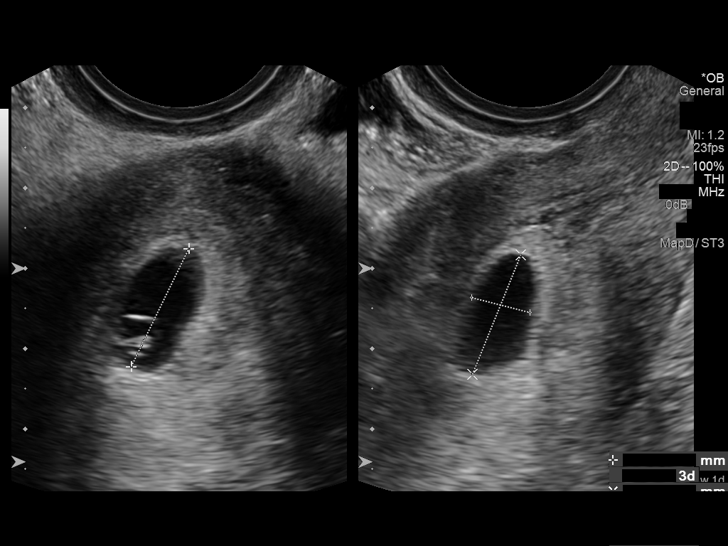
[im 86/122]
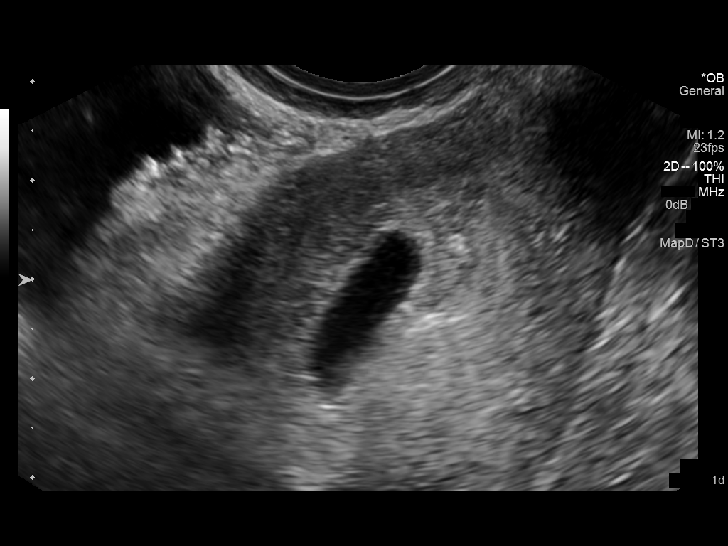
[im 95/122]
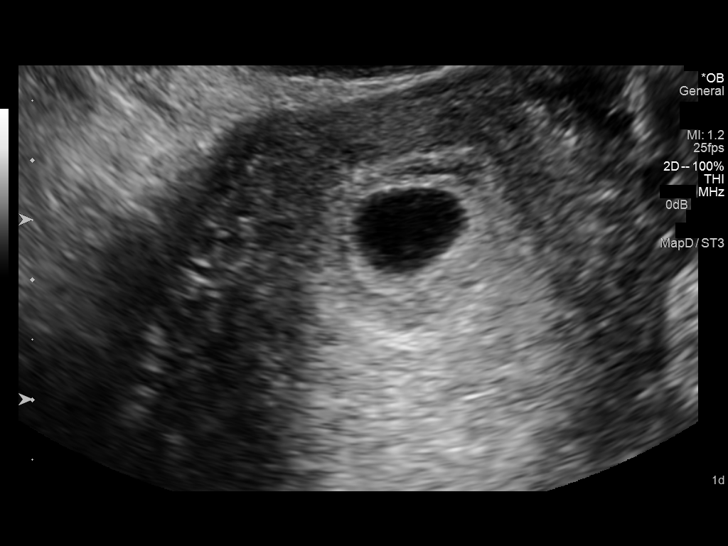
[im 104/122]
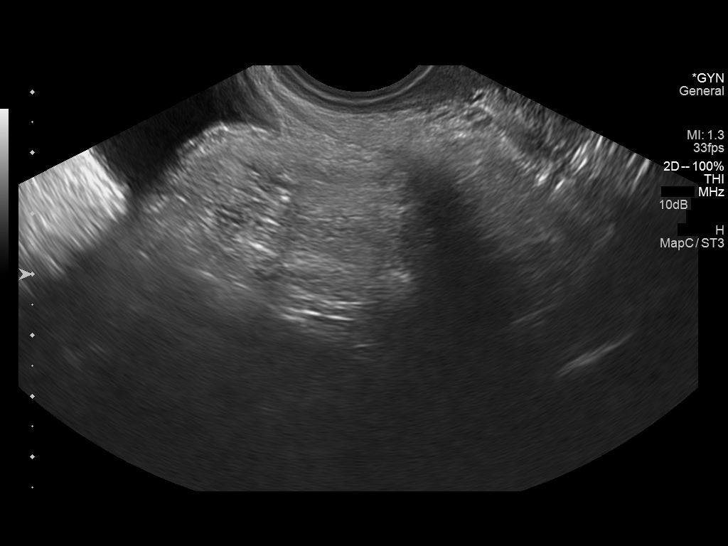
[im 113/122]
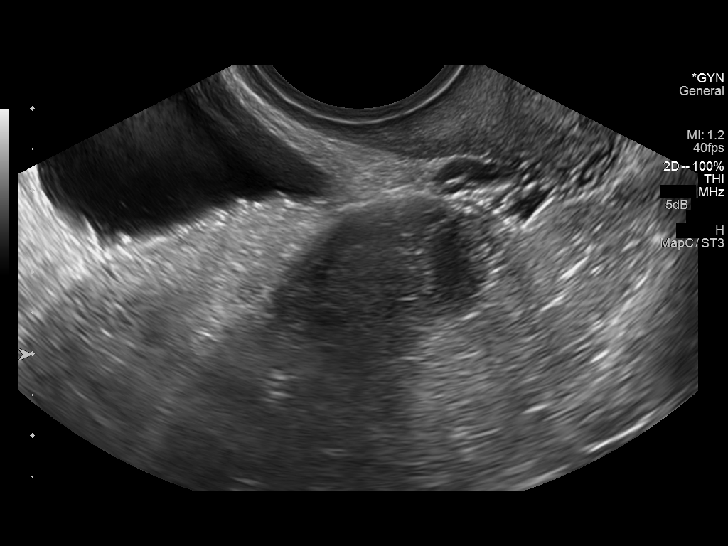
[im 122/122]
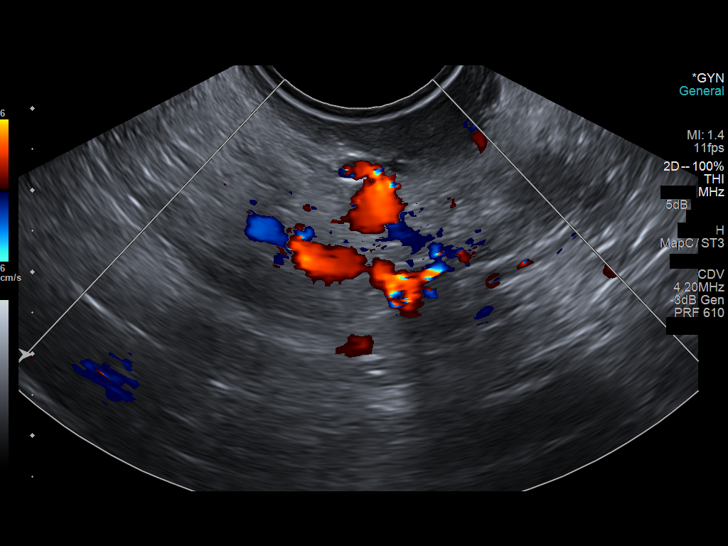

[14 of 28 positions shown; findings below may reference images not displayed]

FINDINGS: Intrauterine gestational sac: Visualized/normal in shape. No
subchorionic hemorrhage.

Yolk sac:  Present and normal in size

Embryo:  Present

Cardiac Activity: Not yet seen, within normal limits for crown-rump
length.

CRL:  3.1  mm   5 w   6 d                  US EDC: 04/30/2015

Maternal uterus/adnexae: The right ovary is not visible due to
shadowing bowel. The left ovary is normal in size and appearance. No
free pelvic fluid.
IMPRESSION: 1. Single intrauterine gestation at 5 weeks 6 days.
2. No explanation for vaginal bleeding.

## 2018-02-13 ENCOUNTER — Encounter: Payer: Self-pay | Admitting: Adult Health

## 2018-02-13 ENCOUNTER — Ambulatory Visit: Payer: BLUE CROSS/BLUE SHIELD | Admitting: Adult Health

## 2018-02-13 VITALS — BP 127/77 | HR 105 | Ht 63.0 in | Wt 240.0 lb

## 2018-02-13 DIAGNOSIS — O26851 Spotting complicating pregnancy, first trimester: Secondary | ICD-10-CM

## 2018-02-13 DIAGNOSIS — Z3201 Encounter for pregnancy test, result positive: Secondary | ICD-10-CM

## 2018-02-13 DIAGNOSIS — Z3A01 Less than 8 weeks gestation of pregnancy: Secondary | ICD-10-CM

## 2018-02-13 DIAGNOSIS — N926 Irregular menstruation, unspecified: Secondary | ICD-10-CM | POA: Diagnosis not present

## 2018-02-13 DIAGNOSIS — O3680X0 Pregnancy with inconclusive fetal viability, not applicable or unspecified: Secondary | ICD-10-CM | POA: Insufficient documentation

## 2018-02-13 LAB — POCT URINE PREGNANCY: Preg Test, Ur: POSITIVE — AB

## 2018-02-13 NOTE — Progress Notes (Signed)
  Subjective:     Patient ID: Vanessa Rice, female   DOB: 1985/07/05, 32 y.o.   MRN: [id number]  HPI Vanessa Rice is a 32 year old white female, married, worked in for [employer], had had 2 +HPTs, and has spotted 2 x sin as many days after BM, discharge light brown now.She had miscarriage at 8 weeks in past, then had healthy baby boy, who is 2 now.   Review of Systems Missed period with +HPT Spotted twice after BM, has light brown discharge now Reviewed past medical,surgical, social and family history. Reviewed medications and allergies.     Objective:   Physical Exam BP 127/77 (BP Location: Left Arm, Patient Position: Sitting, Cuff Size: Large)   Pulse (!) 105   Ht 5\' 3"  (1.6 m)   Wt 240 lb (108.9 kg)   LMP 01/05/2018 (Exact Date)   BMI 42.51 kg/m UPT +, about 5+4 weeks by LMP with EDD 10/13/18.Skin warm and dry. Lungs: clear to ausculation bilaterally. Cardiovascular: regular rate and rhythm. Discussed can spot after BM and sex, will check labs and get US, and no sex for now.  [facility], and schedule US for next week.     Assessment:     1. Pregnancy test positive   2. Less than [redacted] weeks gestation of pregnancy   3. Encounter to determine fetal viability of pregnancy, single or unspecified fetus   4. Spotting affecting pregnancy in first trimester       Plan:     Continue PNV [facility] for new OB No sex Check QHCG and progesterone level today Review handouts on First trimester and by [facility]

## 2018-02-13 NOTE — Patient Instructions (Signed)
First Trimester of Pregnancy The first trimester of pregnancy is from week 1 until the end of week 13 (months 1 through 3). A week after a sperm fertilizes an egg, the egg will implant on the wall of the uterus. This embryo will begin to develop into a baby. Genes from you and your partner will form the baby. The female genes will determine whether the baby will be a boy or a girl. At 6-8 weeks, the eyes and face will be formed, and the heartbeat can be seen on ultrasound. At the end of 12 weeks, all the baby's organs will be formed. Now that you are pregnant, you will want to do everything you can to have a healthy baby. Two of the most important things are to get good prenatal care and to follow your health care provider's instructions. Prenatal care is all the medical care you receive before the baby's birth. This care will help prevent, find, and treat any problems during the pregnancy and childbirth. Body changes during your first trimester Your body goes through many changes during pregnancy. The changes vary from woman to woman.  You may gain or lose a couple of pounds at first.  You may feel sick to your stomach (nauseous) and you may throw up (vomit). If the vomiting is uncontrollable, call your health care provider.  You may tire easily.  You may develop headaches that can be relieved by medicines. All medicines should be approved by your health care provider.  You may urinate more often. Painful urination may mean you have a bladder infection.  You may develop heartburn as a result of your pregnancy.  You may develop constipation because certain hormones are causing the muscles that push stool through your intestines to slow down.  You may develop hemorrhoids or swollen veins (varicose veins).  Your breasts may begin to grow larger and become tender. Your nipples may stick out more, and the tissue that surrounds them (areola) may become darker.  Your gums may bleed and may be  sensitive to brushing and flossing.  Dark spots or blotches (chloasma, mask of pregnancy) may develop on your face. This will likely fade after the baby is born.  Your menstrual periods will stop.  You may have a loss of appetite.  You may develop cravings for certain kinds of food.  You may have changes in your emotions from day to day, such as being excited to be pregnant or being concerned that something may go wrong with the pregnancy and baby.  You may have more vivid and strange dreams.  You may have changes in your hair. These can include thickening of your hair, rapid growth, and changes in texture. Some women also have hair loss during or after pregnancy, or hair that feels dry or thin. Your hair will most likely return to normal after your baby is born.  What to expect at prenatal visits During a routine prenatal visit:  You will be weighed to make sure you and the baby are growing normally.  Your blood pressure will be taken.  Your abdomen will be measured to track your baby's growth.  The fetal heartbeat will be listened to between weeks 10 and 14 of your pregnancy.  Test results from any previous visits will be discussed.  Your health care provider may ask you:  How you are feeling.  If you are feeling the baby move.  If you have had any abnormal symptoms, such as leaking fluid, bleeding, severe headaches,   or abdominal cramping.  If you are using any tobacco products, including cigarettes, chewing tobacco, and electronic cigarettes.  If you have any questions.  Other tests that may be performed during your first trimester include:  Blood tests to find your blood type and to check for the presence of any previous infections. The tests will also be used to check for low iron levels (anemia) and protein on red blood cells (Rh antibodies). Depending on your risk factors, or if you previously had diabetes during pregnancy, you may have tests to check for high blood  sugar that affects pregnant women (gestational diabetes).  Urine tests to check for infections, diabetes, or protein in the urine.  An ultrasound to confirm the proper growth and development of the baby.  Fetal screens for spinal cord problems (spina bifida) and Down syndrome.  HIV (human immunodeficiency virus) testing. Routine prenatal testing includes screening for HIV, unless you choose not to have this test.  You may need other tests to make sure you and the baby are doing well.  Follow these instructions at home: Medicines  Follow your health care provider's instructions regarding medicine use. Specific medicines may be either safe or unsafe to take during pregnancy.  Take a prenatal vitamin that contains at least 600 micrograms (mcg) of folic acid.  If you develop constipation, try taking a stool softener if your health care provider approves. Eating and drinking  Eat a balanced diet that includes fresh fruits and vegetables, whole grains, good sources of protein such as meat, eggs, or tofu, and low-fat dairy. Your health care provider will help you determine the amount of weight gain that is right for you.  Avoid raw meat and uncooked cheese. These carry germs that can cause birth defects in the baby.  Eating four or five small meals rather than three large meals a day may help relieve nausea and vomiting. If you start to feel nauseous, eating a few soda crackers can be helpful. Drinking liquids between meals, instead of during meals, also seems to help ease nausea and vomiting.  Limit foods that are high in fat and processed sugars, such as fried and sweet foods.  To prevent constipation: ? Eat foods that are high in fiber, such as fresh fruits and vegetables, whole grains, and beans. ? Drink enough fluid to keep your urine clear or pale yellow. Activity  Exercise only as directed by your health care provider. Most women can continue their usual exercise routine during  pregnancy. Try to exercise for 30 minutes at least 5 days a week. Exercising will help you: ? Control your weight. ? Stay in shape. ? Be prepared for labor and delivery.  Experiencing pain or cramping in the lower abdomen or lower back is a good sign that you should stop exercising. Check with your health care provider before continuing with normal exercises.  Try to avoid standing for long periods of time. Move your legs often if you must stand in one place for a long time.  Avoid heavy lifting.  Wear low-heeled shoes and practice good posture.  You may continue to have sex unless your health care provider tells you not to. Relieving pain and discomfort  Wear a good support bra to relieve breast tenderness.  Take warm sitz baths to soothe any pain or discomfort caused by hemorrhoids. Use hemorrhoid cream if your health care provider approves.  Rest with your legs elevated if you have leg cramps or low back pain.  If you develop   varicose veins in your legs, wear support hose. Elevate your feet for 15 minutes, 3-4 times a day. Limit salt in your diet. Prenatal care  Schedule your prenatal visits by the twelfth week of pregnancy. They are usually scheduled monthly at first, then more often in the last 2 months before delivery.  Write down your questions. Take them to your prenatal visits.  Keep all your prenatal visits as told by your health care provider. This is important. Safety  Wear your seat belt at all times when driving.  Make a list of emergency phone numbers, including numbers for family, friends, the hospital, and police and fire departments. General instructions  Ask your health care provider for a referral to a local prenatal education class. Begin classes no later than the beginning of month 6 of your pregnancy.  Ask for help if you have counseling or nutritional needs during pregnancy. Your health care provider can offer advice or refer you to specialists for help  with various needs.  Do not use hot tubs, steam rooms, or saunas.  Do not douche or use tampons or scented sanitary pads.  Do not cross your legs for long periods of time.  Avoid cat litter boxes and soil used by cats. These carry germs that can cause birth defects in the baby and possibly loss of the fetus by miscarriage or stillbirth.  Avoid all smoking, herbs, alcohol, and medicines not prescribed by your health care provider. Chemicals in these products affect the formation and growth of the baby.  Do not use any products that contain nicotine or tobacco, such as cigarettes and e-cigarettes. If you need help quitting, ask your health care provider. You may receive counseling support and other resources to help you quit.  Schedule a dentist appointment. At home, brush your teeth with a soft toothbrush and be gentle when you floss. Contact a health care provider if:  You have dizziness.  You have mild pelvic cramps, pelvic pressure, or nagging pain in the abdominal area.  You have persistent nausea, vomiting, or diarrhea.  You have a bad smelling vaginal discharge.  You have pain when you urinate.  You notice increased swelling in your face, hands, legs, or ankles.  You are exposed to fifth disease or chickenpox.  You are exposed to German measles (rubella) and have never had it. Get help right away if:  You have a fever.  You are leaking fluid from your vagina.  You have spotting or bleeding from your vagina.  You have severe abdominal cramping or pain.  You have rapid weight gain or loss.  You vomit blood or material that looks like coffee grounds.  You develop a severe headache.  You have shortness of breath.  You have any kind of trauma, such as from a fall or a car accident. Summary  The first trimester of pregnancy is from week 1 until the end of week 13 (months 1 through 3).  Your body goes through many changes during pregnancy. The changes vary from  woman to woman.  You will have routine prenatal visits. During those visits, your health care provider will examine you, discuss any test results you may have, and talk with you about how you are feeling. This information is not intended to replace advice given to you by your health care provider. Make sure you discuss any questions you have with your health care provider. Document Released: 06/12/2001 Document Revised: 05/30/2016 Document Reviewed: 05/30/2016 Elsevier Interactive Patient Education  2018 [employer]  

## 2018-02-14 ENCOUNTER — Ambulatory Visit: Payer: 59 | Admitting: Adult Health

## 2018-02-14 ENCOUNTER — Telehealth: Payer: Self-pay | Admitting: Adult Health

## 2018-02-14 DIAGNOSIS — O26851 Spotting complicating pregnancy, first trimester: Secondary | ICD-10-CM

## 2018-02-14 LAB — PROGESTERONE: Progesterone: 4.8 ng/mL

## 2018-02-14 LAB — BETA HCG QUANT (REF LAB): hCG Quant: 157 m[IU]/mL

## 2018-02-14 MED ORDER — PROGESTERONE MICRONIZED 200 MG PO CAPS: ORAL_CAPSULE | ORAL | 3 refills | Status: DC

## 2018-02-14 NOTE — Telephone Encounter (Signed)
Pt aware of QHCG 157 so about 4 week, will check tomorrow to see if rising, hopefully doubling, and that progesterone is low at 4.8, will add prometrium 200 mg caps in vagina at hs daily

## 2018-02-16 LAB — BETA HCG QUANT (REF LAB): HCG QUANT: 203 m[IU]/mL

## 2018-02-17 ENCOUNTER — Other Ambulatory Visit: Payer: Self-pay | Admitting: Adult Health

## 2018-02-17 DIAGNOSIS — O26851 Spotting complicating pregnancy, first trimester: Secondary | ICD-10-CM

## 2018-02-17 NOTE — Progress Notes (Signed)
Recheck QHCG in am °

## 2018-02-19 LAB — BETA HCG QUANT (REF LAB): hCG Quant: 361 m[IU]/mL

## 2018-02-21 ENCOUNTER — Ambulatory Visit (HOSPITAL_COMMUNITY)
Admission: RE | Admit: 2018-02-21 | Discharge: 2018-02-21 | Disposition: A | Discharge status: Home / Self Care | Payer: BLUE CROSS/BLUE SHIELD | Source: Ambulatory Visit | Attending: Adult Health | Admitting: Adult Health

## 2018-02-21 DIAGNOSIS — Z3A01 Less than 8 weeks gestation of pregnancy: Secondary | ICD-10-CM | POA: Diagnosis not present

## 2018-02-21 DIAGNOSIS — O3680X0 Pregnancy with inconclusive fetal viability, not applicable or unspecified: Secondary | ICD-10-CM | POA: Insufficient documentation

## 2018-02-21 DIAGNOSIS — O26841 Uterine size-date discrepancy, first trimester: Secondary | ICD-10-CM | POA: Diagnosis not present

## 2018-02-24 ENCOUNTER — Telehealth: Payer: Self-pay | Admitting: Adult Health

## 2018-02-24 DIAGNOSIS — O26851 Spotting complicating pregnancy, first trimester: Secondary | ICD-10-CM

## 2018-02-24 NOTE — Telephone Encounter (Signed)
Will check F/U QHCG,has been bleeding.

## 2018-02-28 DIAGNOSIS — O26851 Spotting complicating pregnancy, first trimester: Secondary | ICD-10-CM | POA: Diagnosis not present

## 2018-03-01 LAB — BETA HCG QUANT (REF LAB): hCG Quant: 14 m[IU]/mL

## 2018-03-05 ENCOUNTER — Other Ambulatory Visit: Payer: Self-pay | Admitting: Adult Health

## 2018-03-05 DIAGNOSIS — O039 Complete or unspecified spontaneous abortion without complication: Secondary | ICD-10-CM

## 2018-03-05 NOTE — Progress Notes (Signed)
Recheck QHCG next friday

## 2018-03-14 ENCOUNTER — Encounter: Payer: BLUE CROSS/BLUE SHIELD | Admitting: Women's Health

## 2018-03-14 ENCOUNTER — Ambulatory Visit: Payer: BLUE CROSS/BLUE SHIELD | Admitting: *Deleted

## 2018-03-14 DIAGNOSIS — O039 Complete or unspecified spontaneous abortion without complication: Secondary | ICD-10-CM | POA: Diagnosis not present

## 2018-03-15 LAB — BETA HCG QUANT (REF LAB)

## 2019-07-20 ENCOUNTER — Other Ambulatory Visit: Payer: Self-pay

## 2019-07-20 ENCOUNTER — Ambulatory Visit (INDEPENDENT_AMBULATORY_CARE_PROVIDER_SITE_OTHER): Payer: BLUE CROSS/BLUE SHIELD | Admitting: *Deleted

## 2019-07-20 ENCOUNTER — Encounter: Payer: Self-pay | Admitting: *Deleted

## 2019-07-20 ENCOUNTER — Other Ambulatory Visit: Payer: BLUE CROSS/BLUE SHIELD

## 2019-07-20 VITALS — BP 127/74 | HR 106 | Ht 63.0 in | Wt 252.4 lb

## 2019-07-20 DIAGNOSIS — Z3201 Encounter for pregnancy test, result positive: Secondary | ICD-10-CM | POA: Diagnosis not present

## 2019-07-20 LAB — POCT URINE PREGNANCY: Preg Test, Ur: POSITIVE — AB

## 2019-07-20 NOTE — Progress Notes (Signed)
Chart reviewed for nurse visit. Agree with plan of care. Will check progesterone level Adline Potter, NP 07/20/2019 2:35 PM

## 2019-07-20 NOTE — Progress Notes (Signed)
   NURSE VISIT- PREGNANCY CONFIRMATION   SUBJECTIVE:  Vanessa Rice is a 34 y.o. G72P1011 female at [redacted]w[redacted]d by certain LMP of Patient's last menstrual period was 06/19/2019 (exact date). Here for pregnancy confirmation.  Home pregnancy test: positive x two  She reports no complaints.  She is taking prenatal vitamins.    OBJECTIVE:  BP 127/74 (BP Location: Left Arm, Patient Position: Sitting, Cuff Size: Large)   Pulse (!) 106   Ht 5\' 3"  (1.6 m)   Wt 252 lb 6.4 oz (114.5 kg)   LMP 06/19/2019 (Exact Date)   BMI 44.71 kg/m   Appears well, in no apparent distress OB History  Gravida Para Term Preterm AB Living  3 1 1  0 1 1  SAB TAB Ectopic Multiple Live Births  1 0 0 0 1    # Outcome Date GA Lbr Len/2nd Weight Sex Delivery Anes PTL Lv  3 Current           2 Term 09/12/15 [redacted]w[redacted]d  6 lb 13 oz (3.09 kg) M CS-LTranv EPI  LIV  1 SAB 08/2014 [redacted]w[redacted]d           Results for orders placed or performed in visit on 07/20/19 (from the past 24 hour(s))  POCT urine pregnancy   Collection Time: 07/20/19  2:16 PM  Result Value Ref Range   Preg Test, Ur Positive (A) Negative    ASSESSMENT: Positive pregnancy test, [redacted]w[redacted]d by LMP    PLAN: Schedule for dating ultrasound in 4 weeks. Prenatal vitamins: continue   Nausea medicines: not currently needed   OB packet given: Yes  07/22/19  07/20/2019 2:26 PM

## 2019-07-21 LAB — PROGESTERONE: Progesterone: 15.9 ng/mL

## 2019-08-18 ENCOUNTER — Other Ambulatory Visit: Payer: Self-pay | Admitting: Obstetrics & Gynecology

## 2019-08-18 DIAGNOSIS — O3680X0 Pregnancy with inconclusive fetal viability, not applicable or unspecified: Secondary | ICD-10-CM

## 2019-08-19 ENCOUNTER — Other Ambulatory Visit: Payer: Self-pay

## 2019-08-19 ENCOUNTER — Ambulatory Visit (INDEPENDENT_AMBULATORY_CARE_PROVIDER_SITE_OTHER): Payer: BC Managed Care – PPO

## 2019-08-19 DIAGNOSIS — Z3A08 8 weeks gestation of pregnancy: Secondary | ICD-10-CM | POA: Diagnosis not present

## 2019-08-19 DIAGNOSIS — O3680X0 Pregnancy with inconclusive fetal viability, not applicable or unspecified: Secondary | ICD-10-CM | POA: Diagnosis not present

## 2019-08-19 NOTE — Progress Notes (Signed)
[place] 8+5 wks,single IUP w/ys,positive fht 166 bpm,normal ovaries,crl 21.48 mm

## 2019-09-17 ENCOUNTER — Other Ambulatory Visit: Payer: Self-pay | Admitting: Obstetrics and Gynecology

## 2019-09-17 DIAGNOSIS — Z3682 Encounter for antenatal screening for nuchal translucency: Secondary | ICD-10-CM

## 2019-09-18 ENCOUNTER — Ambulatory Visit (INDEPENDENT_AMBULATORY_CARE_PROVIDER_SITE_OTHER): Payer: BC Managed Care – PPO | Admitting: Women's Health

## 2019-09-18 ENCOUNTER — Other Ambulatory Visit (HOSPITAL_COMMUNITY)
Admission: RE | Admit: 2019-09-18 | Discharge: 2019-09-18 | Disposition: A | Discharge status: Home / Self Care | Payer: BC Managed Care – PPO | Source: Ambulatory Visit | Attending: Obstetrics & Gynecology | Admitting: Obstetrics & Gynecology

## 2019-09-18 ENCOUNTER — Other Ambulatory Visit: Payer: Self-pay

## 2019-09-18 ENCOUNTER — Ambulatory Visit (INDEPENDENT_AMBULATORY_CARE_PROVIDER_SITE_OTHER): Payer: BC Managed Care – PPO

## 2019-09-18 ENCOUNTER — Ambulatory Visit: Payer: BC Managed Care – PPO | Admitting: *Deleted

## 2019-09-18 ENCOUNTER — Encounter: Payer: Self-pay | Admitting: Women's Health

## 2019-09-18 VITALS — BP 136/85 | HR 97 | Wt 241.0 lb

## 2019-09-18 DIAGNOSIS — Z3480 Encounter for supervision of other normal pregnancy, unspecified trimester: Secondary | ICD-10-CM

## 2019-09-18 DIAGNOSIS — O0990 Supervision of high risk pregnancy, unspecified, unspecified trimester: Secondary | ICD-10-CM | POA: Insufficient documentation

## 2019-09-18 DIAGNOSIS — Z131 Encounter for screening for diabetes mellitus: Secondary | ICD-10-CM

## 2019-09-18 DIAGNOSIS — Z6841 Body Mass Index (BMI) 40.0 and over, adult: Secondary | ICD-10-CM

## 2019-09-18 DIAGNOSIS — Z3682 Encounter for antenatal screening for nuchal translucency: Secondary | ICD-10-CM

## 2019-09-18 DIAGNOSIS — Z3481 Encounter for supervision of other normal pregnancy, first trimester: Secondary | ICD-10-CM

## 2019-09-18 DIAGNOSIS — Z3A13 13 weeks gestation of pregnancy: Secondary | ICD-10-CM | POA: Diagnosis not present

## 2019-09-18 DIAGNOSIS — Z98891 History of uterine scar from previous surgery: Secondary | ICD-10-CM

## 2019-09-18 DIAGNOSIS — O99213 Obesity complicating pregnancy, third trimester: Secondary | ICD-10-CM

## 2019-09-18 DIAGNOSIS — E6609 Other obesity due to excess calories: Secondary | ICD-10-CM

## 2019-09-18 DIAGNOSIS — O09291 Supervision of pregnancy with other poor reproductive or obstetric history, first trimester: Secondary | ICD-10-CM

## 2019-09-18 DIAGNOSIS — O09299 Supervision of pregnancy with other poor reproductive or obstetric history, unspecified trimester: Secondary | ICD-10-CM

## 2019-09-18 DIAGNOSIS — Z363 Encounter for antenatal screening for malformations: Secondary | ICD-10-CM

## 2019-09-18 LAB — POCT URINALYSIS DIPSTICK OB
Blood, UA: NEGATIVE
Glucose, UA: NEGATIVE
Ketones, UA: NEGATIVE
Leukocytes, UA: NEGATIVE
Nitrite, UA: NEGATIVE
POC,PROTEIN,UA: NEGATIVE

## 2019-09-18 MED ORDER — BLOOD PRESSURE MONITOR MISC: 0 refills | Status: DC

## 2019-09-18 NOTE — Progress Notes (Signed)
[place] 13 wks,measurements c/w dates,69.40 mm,NB present,NT 1.5 mm,normal ovaries,fhr 150 bpm,anterior placenta

## 2019-09-18 NOTE — Progress Notes (Addendum)
INITIAL OBSTETRICAL VISIT Patient name: Lamika Connolly MRN [id number]  Date of birth: 12-10-1985 Chief Complaint:   Initial Prenatal Visit  History of Present Illness:   Zerenity Bowron is a 34 y.o. [phone] Caucasian female at [redacted]w[redacted]d by LMP c/w 8wk u/s, with an Estimated Date of Delivery: 03/25/20 being seen today for her initial obstetrical visit.   Her obstetrical history is significant for pre-e w/ IOL, c/s for FTP/op/acynclitic, per note will need c/s- pt states b/c of her pelvis.   Today she reports aversion to all smells, some nausea/occ vomiting, declines meds right now.  Patient's last menstrual period was 06/19/2019 (exact date). Last pap 2015. Results were: normal Review of Systems:   Pertinent items are noted in HPI Denies cramping/contractions, leakage of fluid, vaginal bleeding, abnormal vaginal discharge w/ itching/odor/irritation, headaches, visual changes, shortness of breath, chest pain, abdominal pain, severe nausea/vomiting, or problems with urination or bowel movements unless otherwise stated above.  Pertinent History Reviewed:  Reviewed past medical,surgical, social, obstetrical and family history.  Reviewed problem list, medications and allergies. OB History  Gravida Para Term Preterm AB Living  4 1 1  0 2 1  SAB TAB Ectopic Multiple Live Births  2 0 0 0 1    # Outcome Date GA Lbr Len/2nd Weight Sex Delivery Anes PTL Lv  4 Current           3 SAB 01/2018          2 Term 09/12/15 [redacted]w[redacted]d  6 lb 13 oz (3.09 kg) M CS-LTranv EPI  LIV  1 SAB 08/2014 [redacted]w[redacted]d          Physical Assessment:   Vitals:   09/18/19 0923  BP: 136/85  Pulse: 97  Weight: 241 lb (109.3 kg)  Body mass index is 42.69 kg/m.       Physical Examination:  General appearance - well appearing, and in no distress  Mental status - alert, oriented to person, place, and time  Psych:  She has a normal mood and affect  Skin - warm and dry, normal color, no suspicious lesions noted  Chest - effort  normal, all lung fields clear to auscultation bilaterally  Heart - normal rate and regular rhythm  Abdomen - soft, nontender  Extremities:  No swelling or varicosities noted  Pelvic - VULVA: normal appearing vulva with no masses, tenderness or lesions  VAGINA: normal appearing vagina with normal color and thick curdy discharge c/w yeast- pt denies sx, no lesions  CERVIX: normal appearing cervix without discharge or lesions, no CMT  Thin prep pap is done w/ HR HPV cotesting  TODAY'S NT [place] 13 wks,measurements c/w dates,69.40 mm,NB present,NT 1.5 mm,normal ovaries,fhr 150 bpm,anterior placenta   Results for orders placed or performed in visit on 09/18/19 (from the past 24 hour(s))  POC Urinalysis Dipstick OB   Collection Time: 09/18/19  9:38 AM  Result Value Ref Range   Color, UA     Clarity, UA     Glucose, UA Negative Negative   Bilirubin, UA     Ketones, UA neg    Spec Grav, UA     Blood, UA neg    pH, UA     POC,PROTEIN,UA Negative Negative, Trace, Small (1+), Moderate (2+), Large (3+), 4+   Urobilinogen, UA     Nitrite, UA neg    Leukocytes, UA Negative Negative   Appearance     Odor      Assessment & Plan:  1) Low-Risk Pregnancy  [id number] at [redacted]w[redacted]d with an Estimated Date of Delivery: 03/25/20   2) Initial OB visit  3) H/O pre-e> baseline labs today, ASA 162mg  daily  4) Prev c/s> for FTP/OP/acynclitic, per note needs C/S- pt states d/t pelvis  5) Vaginal candida> otc monistat 7  Meds:  Meds ordered this encounter  Medications  . Blood Pressure Monitor MISC    Sig: For regular home bp monitoring during pregnancy    Dispense:  1 each    Refill:  0    Dx: z34.90    Order Specific Question:   Supervising Provider    Answer:   H [[id number]]    Initial labs obtained Continue prenatal vitamins Reviewed n/v relief measures and warning s/s to report Reviewed recommended weight gain based on pre-gravid BMI Encouraged well-balanced diet Genetic Screening discussed:  requested  Nt/it, declined maternit21 Cystic fibrosis, SMA, Fragile X screening discussed declined Ultrasound discussed; fetal survey: requested CCNC completed>PCM not here, not applying for preg mcaid The nature of [facility] with multiple MDs and other Advanced Practice Providers was explained to patient; also emphasized that fellows, residents, and students are part of our team. Does not have home bp cuff. Rx faxed to [place]. Check bp weekly, let [employer] know if >140/90.   Indications for ASA therapy (per uptodate) One of the following: Previous pregnancy with preeclampsia, especially early onset/adverse outcome Yes   Indications for early DM screening (per uptodate) BMI >=25 (>=23 in Asian women) AND one of the following Other clinical condition associated with insulin resistance (eg, severe obesity, acanthosis nigricans) Yes, BMI 42  Follow-up: Return in about 6 weeks (around 10/30/2019) for LROB, 2nd IT, 11/01/2019, in person, CNM.   Orders Placed This Encounter  Procedures  . Urine Culture  . SE:GBTDVVO OB Comp + 14 Wk  . Integrated 1  . Obstetric Panel, Including HIV  . Hepatitis C Antibody  . Hemoglobinopathy Evaluation  . Pain Management Screening Profile (10S)  . Comprehensive metabolic panel  . Protein / creatinine ratio, urine  . Hemoglobin A1c  . POC Urinalysis Dipstick OB    [place] CNM, [facility] 09/18/2019 11:03 AM

## 2019-09-18 NOTE — Addendum Note (Signed)
Addended by: Shawna Clamp R on: 09/18/2019 11:03 AM   Modules accepted: Orders

## 2019-09-18 NOTE — Patient Instructions (Addendum)
Vanessa Rice, I greatly value your feedback.  If you receive a survey following your visit with [place] today, we appreciate you taking the time to fill it out.  Thanks, Knute Neu, CNM, [facility]  [place]!!! It is now [place] at [facility] ([place], [place] [address]) Entrance located off of [address] parking   Begin taking 162mg  (two 81mg  tablets) baby aspirin daily to decrease the risk of preeclampsia during pregnancy    You can try a 7 night over the counter yeast cream such as Monistat 7. It does not need to be brand name, generic is fine, but please make sure it is a 7 night cream.     Nausea & Vomiting  Have saltine crackers or pretzels by your bed and eat a few bites before you raise your head out of bed in the morning  Eat small frequent meals throughout the day instead of large meals  Drink plenty of fluids throughout the day to stay hydrated, just don't drink a lot of fluids with your meals.  This can make your stomach fill up faster making you feel sick  Do not brush your teeth right after you eat  Products with real ginger are good for nausea, like ginger ale and ginger hard candy Make sure it says made with real ginger!  Sucking on sour candy like lemon heads is also good for nausea  If your prenatal vitamins make you nauseated, take them at night so you will sleep through the nausea  Sea Bands  If you feel like you need medicine for the nausea & vomiting please let us know  If you are unable to keep any fluids or food down please let us know   Constipation  Drink plenty of fluid, preferably water, throughout the day  Eat foods high in fiber such as fruits, vegetables, and grains  Exercise, such as walking, is a good way to keep your bowels regular  Drink warm fluids, especially warm prune juice, or decaf coffee  Eat a 1/2 cup of real oatmeal (not instant), 1/2 cup applesauce, and 1/2-1  cup warm prune juice every day  If needed, you may take Colace (docusate sodium) stool softener once or twice a day to help keep the stool soft.   If you still are having problems with constipation, you may take Miralax once daily as needed to help keep your bowels regular.   Home Blood Pressure Monitoring for Patients   Your provider has recommended that you check your blood pressure (BP) at least once a week at home. If you do not have a blood pressure cuff at home, one will be provided for you. Contact your provider if you have not received your monitor within 1 week.   Helpful Tips for Accurate Home Blood Pressure Checks  . Don't smoke, exercise, or drink caffeine 30 minutes before checking your BP . Use the restroom before checking your BP (a full bladder can raise your pressure) . Relax in a comfortable upright chair . Feet on the ground . Left arm resting comfortably on a flat surface at the level of your heart . Legs uncrossed . Back supported . Sit quietly and don't talk . Place the cuff on your bare arm . Adjust snuggly, so that only two fingertips can fit between your skin and the top of the cuff . Check 2 readings separated by at least one minute . Keep  a log of your BP readings . For a visual, please reference this diagram: http://ccnc.care/bpdiagram  Provider Name: [facility]     Phone: [phone]  Zone 1: ALL CLEAR  Continue to monitor your symptoms:  . BP reading is less than 140 (top number) or less than 90 (bottom number)  . No right upper stomach pain . No headaches or seeing spots . No feeling nauseated or throwing up . No swelling in face and hands  Zone 2: CAUTION Call your doctor's office for any of the following:  . BP reading is greater than 140 (top number) or greater than 90 (bottom number)  . Stomach pain under your ribs in the middle or right side . Headaches or seeing spots . Feeling nauseated or throwing up . Swelling in face and  hands  Zone 3: EMERGENCY  Seek immediate medical care if you have any of the following:  . BP reading is greater than160 (top number) or greater than 110 (bottom number) . Severe headaches not improving with Tylenol . Serious difficulty catching your breath . Any worsening symptoms from Zone 2    First Trimester of Pregnancy The first trimester of pregnancy is from week 1 until the end of week 12 (months 1 through 3). A week after a sperm fertilizes an egg, the egg will implant on the wall of the uterus. This embryo will begin to develop into a baby. Genes from you and your partner are forming the baby. The female genes determine whether the baby is a boy or a girl. At 6-8 weeks, the eyes and face are formed, and the heartbeat can be seen on ultrasound. At the end of 12 weeks, all the baby's organs are formed.  Now that you are pregnant, you will want to do everything you can to have a healthy baby. Two of the most important things are to get good prenatal care and to follow your health care provider's instructions. Prenatal care is all the medical care you receive before the baby's birth. This care will help prevent, find, and treat any problems during the pregnancy and childbirth. BODY CHANGES Your body goes through many changes during pregnancy. The changes vary from woman to woman.   You may gain or lose a couple of pounds at first.  You may feel sick to your stomach (nauseous) and throw up (vomit). If the vomiting is uncontrollable, call your health care provider.  You may tire easily.  You may develop headaches that can be relieved by medicines approved by your health care provider.  You may urinate more often. Painful urination may mean you have a bladder infection.  You may develop heartburn as a result of your pregnancy.  You may develop constipation because certain hormones are causing the muscles that push waste through your intestines to slow down.  You may develop hemorrhoids  or swollen, bulging veins (varicose veins).  Your breasts may begin to grow larger and become tender. Your nipples may stick out more, and the tissue that surrounds them (areola) may become darker.  Your gums may bleed and may be sensitive to brushing and flossing.  Dark spots or blotches (chloasma, mask of pregnancy) may develop on your face. This will likely fade after the baby is born.  Your menstrual periods will stop.  You may have a loss of appetite.  You may develop cravings for certain kinds of food.  You may have changes in your emotions from day to day, such as being  excited to be pregnant or being concerned that something may go wrong with the pregnancy and baby.  You may have more vivid and strange dreams.  You may have changes in your hair. These can include thickening of your hair, rapid growth, and changes in texture. Some women also have hair loss during or after pregnancy, or hair that feels dry or thin. Your hair will most likely return to normal after your baby is born. WHAT TO EXPECT AT YOUR PRENATAL VISITS During a routine prenatal visit:  You will be weighed to make sure you and the baby are growing normally.  Your blood pressure will be taken.  Your abdomen will be measured to track your baby's growth.  The fetal heartbeat will be listened to starting around week 10 or 12 of your pregnancy.  Test results from any previous visits will be discussed. Your health care provider may ask you:  How you are feeling.  If you are feeling the baby move.  If you have had any abnormal symptoms, such as leaking fluid, bleeding, severe headaches, or abdominal cramping.  If you have any questions. Other tests that may be performed during your first trimester include:  Blood tests to find your blood type and to check for the presence of any previous infections. They will also be used to check for low iron levels (anemia) and Rh antibodies. Later in the pregnancy, blood  tests for diabetes will be done along with other tests if problems develop.  Urine tests to check for infections, diabetes, or protein in the urine.  An ultrasound to confirm the proper growth and development of the baby.  An amniocentesis to check for possible genetic problems.  Fetal screens for spina bifida and Down syndrome.  You may need other tests to make sure you and the baby are doing well. HOME CARE INSTRUCTIONS  Medicines  Follow your health care provider's instructions regarding medicine use. Specific medicines may be either safe or unsafe to take during pregnancy.  Take your prenatal vitamins as directed.  If you develop constipation, try taking a stool softener if your health care provider approves. Diet  Eat regular, well-balanced meals. Choose a variety of foods, such as meat or vegetable-based protein, fish, milk and low-fat dairy products, vegetables, fruits, and whole grain breads and cereals. Your health care provider will help you determine the amount of weight gain that is right for you.  Avoid raw meat and uncooked cheese. These carry germs that can cause birth defects in the baby.  Eating four or five small meals rather than three large meals a day may help relieve nausea and vomiting. If you start to feel nauseous, eating a few soda crackers can be helpful. Drinking liquids between meals instead of during meals also seems to help nausea and vomiting.  If you develop constipation, eat more high-fiber foods, such as fresh vegetables or fruit and whole grains. Drink enough fluids to keep your urine clear or pale yellow. Activity and Exercise  Exercise only as directed by your health care provider. Exercising will help you:  Control your weight.  Stay in shape.  Be prepared for labor and delivery.  Experiencing pain or cramping in the lower abdomen or low back is a good sign that you should stop exercising. Check with your health care provider before  continuing normal exercises.  Try to avoid standing for long periods of time. Move your legs often if you must stand in one place for a long time.  Avoid heavy lifting.  Wear low-heeled shoes, and practice good posture.  You may continue to have sex unless your health care provider directs you otherwise. Relief of Pain or Discomfort  Wear a good support bra for breast tenderness.    Take warm sitz baths to soothe any pain or discomfort caused by hemorrhoids. Use hemorrhoid cream if your health care provider approves.    Rest with your legs elevated if you have leg cramps or low back pain.  If you develop varicose veins in your legs, wear support hose. Elevate your feet for 15 minutes, 3-4 times a day. Limit salt in your diet. Prenatal Care  Schedule your prenatal visits by the twelfth week of pregnancy. They are usually scheduled monthly at first, then more often in the last 2 months before delivery.  Write down your questions. Take them to your prenatal visits.  Keep all your prenatal visits as directed by your health care provider. Safety  Wear your seat belt at all times when driving.  Make a list of emergency phone numbers, including numbers for family, friends, the hospital, and police and fire departments. General Tips  Ask your health care provider for a referral to a local prenatal education class. Begin classes no later than at the beginning of month 6 of your pregnancy.  Ask for help if you have counseling or nutritional needs during pregnancy. Your health care provider can offer advice or refer you to specialists for help with various needs.  Do not use hot tubs, steam rooms, or saunas.  Do not douche or use tampons or scented sanitary pads.  Do not cross your legs for long periods of time.  Avoid cat litter boxes and soil used by cats. These carry germs that can cause birth defects in the baby and possibly loss of the fetus by miscarriage or stillbirth.  Avoid  all smoking, herbs, alcohol, and medicines not prescribed by your health care provider. Chemicals in these affect the formation and growth of the baby.  Schedule a dentist appointment. At home, brush your teeth with a soft toothbrush and be gentle when you floss. SEEK MEDICAL CARE IF:   You have dizziness.  You have mild pelvic cramps, pelvic pressure, or nagging pain in the abdominal area.  You have persistent nausea, vomiting, or diarrhea.  You have a bad smelling vaginal discharge.  You have pain with urination.  You notice increased swelling in your face, hands, legs, or ankles. SEEK IMMEDIATE MEDICAL CARE IF:   You have a fever.  You are leaking fluid from your vagina.  You have spotting or bleeding from your vagina.  You have severe abdominal cramping or pain.  You have rapid weight gain or loss.  You vomit blood or material that looks like coffee grounds.  You are exposed to [place] measles and have never had them.  You are exposed to fifth disease or chickenpox.  You develop a severe headache.  You have shortness of breath.  You have any kind of trauma, such as from a fall or a car accident. Document Released: 06/12/2001 Document Revised: 11/02/2013 Document Reviewed: 04/28/2013 [facility] Patient Information 2015 [place], [place]. This information is not intended to replace advice given to you by your health care provider. Make sure you discuss any questions you have with your health care provider.  Coronavirus (COVID-19) Are you at risk?  Are you at risk for the Coronavirus (COVID-19)?  To be considered HIGH RISK for Coronavirus (COVID-19), you have to meet  the following criteria:  . Traveled to [place], [place], [place], [place] or [place]; or in the [place] to [place], [place], [place], or [place]; and have fever, cough, and shortness of breath within the last 2 weeks of travel OR . Been in close contact with a person diagnosed with COVID-19 within  the last 2 weeks and have fever, cough, and shortness of breath . IF YOU DO NOT MEET THESE CRITERIA, YOU ARE CONSIDERED LOW RISK FOR COVID-19.  What to do if you are HIGH RISK for COVID-19?  Marland Kitchen If you are having a medical emergency, call 911. . Seek medical care right away. Before you go to a doctor's office, urgent care or emergency department, call ahead and tell them about your recent travel, contact with someone diagnosed with COVID-19, and your symptoms. You should receive instructions from your physician's office regarding next steps of care.  . When you arrive at healthcare provider, tell the healthcare staff immediately you have returned from visiting [place], [place], [place], [place] or [place]; or traveled in the [place] to [place], [place], [place], or [place]; in the last two weeks or you have been in close contact with a person diagnosed with COVID-19 in the last 2 weeks.   . Tell the health care staff about your symptoms: fever, cough and shortness of breath. . After you have been seen by a medical provider, you will be either: o Tested for (COVID-19) and discharged home on quarantine except to seek medical care if symptoms worsen, and asked to  - Stay home and avoid contact with others until you get your results (4-5 days)  - Avoid travel on public transportation if possible (such as bus, train, or airplane) or o Sent to the Emergency Department by EMS for evaluation, COVID-19 testing, and possible admission depending on your condition and test results.  What to do if you are LOW RISK for COVID-19?  Reduce your risk of any infection by using the same precautions used for avoiding the common cold or flu:  Marland Kitchen Wash your hands often with soap and warm water for at least 20 seconds.  If soap and water are not readily available, use an alcohol-based hand sanitizer with at least 60% alcohol.  . If coughing or sneezing, cover your mouth and nose by coughing or sneezing into the  elbow areas of your shirt or coat, into a tissue or into your sleeve (not your hands). . Avoid shaking hands with others and consider head nods or verbal greetings only. . Avoid touching your eyes, nose, or mouth with unwashed hands.  . Avoid close contact with people who are sick. . Avoid places or events with large numbers of people in one location, like concerts or sporting events. . Carefully consider travel plans you have or are making. . If you are planning any travel outside or inside the [place], visit the CDC's Travelers' Health webpage for the latest health notices. . If you have some symptoms but not all symptoms, continue to monitor at home and seek medical attention if your symptoms worsen. . If you are having a medical emergency, call 911.   ADDITIONAL HEALTHCARE OPTIONS FOR PATIENTS  [facility] / e-Visit: [email or web address]         [facility]: [phone]  [facility]: [phone]                   [facility]: [phone]

## 2019-09-19 LAB — COMPREHENSIVE METABOLIC PANEL
ALT: 17 IU/L (ref 0–32)
AST: 18 IU/L (ref 0–40)
Albumin/Globulin Ratio: 1.6 (ref 1.2–2.2)
Albumin: 3.9 g/dL (ref 3.8–4.8)
Alkaline Phosphatase: 66 IU/L (ref 39–117)
BUN/Creatinine Ratio: 10 (ref 9–23)
BUN: 5 mg/dL — ABNORMAL LOW (ref 6–20)
Bilirubin Total: <0.2 mg/dL (ref 0.0–1.2)
CO2: 19 mmol/L — ABNORMAL LOW (ref 20–29)
Calcium: 8.8 mg/dL (ref 8.7–10.2)
Chloride: 103 mmol/L (ref 96–106)
Creatinine, Ser: 0.48 mg/dL — ABNORMAL LOW (ref 0.57–1.00)
GFR calc Af Amer: 149 mL/min/{1.73_m2} (ref >59)
GFR calc non Af Amer: 129 mL/min/{1.73_m2} (ref >59)
Globulin, Total: 2.5 g/dL (ref 1.5–4.5)
Glucose: 94 mg/dL (ref 65–99)
Potassium: 4 mmol/L (ref 3.5–5.2)
Sodium: 137 mmol/L (ref 134–144)
Total Protein: 6.4 g/dL (ref 6.0–8.5)

## 2019-09-19 LAB — PROTEIN / CREATININE RATIO, URINE
Creatinine, Urine: 64.2 mg/dL
Protein, Ur: 7.8 mg/dL
Protein/Creat Ratio: 121 mg/g creat (ref 0–200)

## 2019-09-19 LAB — HEMOGLOBIN A1C
Est. average glucose Bld gHb Est-mCnc: 108 mg/dL
Hgb A1c MFr Bld: 5.4 % (ref 4.8–5.6)

## 2019-09-20 LAB — URINE CULTURE

## 2019-09-20 LAB — MED LIST OPTION NOT SELECTED

## 2019-09-21 LAB — INTEGRATED 1
Crown Rump Length: 69.4 mm
Gest. Age on Collection Date: 13 weeks
Maternal Age at EDD: 34.4 yr
Nuchal Translucency (NT): 1.5 mm
Number of Fetuses: 1
PAPP-A Value: 555.9 ng/mL
Weight: 241 [lb_av]

## 2019-09-21 LAB — PMP SCREEN PROFILE (10S), URINE
Amphetamine Scrn, Ur: NEGATIVE ng/mL
BARBITURATE SCREEN URINE: NEGATIVE ng/mL
BENZODIAZEPINE SCREEN, URINE: NEGATIVE ng/mL
CANNABINOIDS UR QL SCN: NEGATIVE ng/mL
Cocaine (Metab) Scrn, Ur: NEGATIVE ng/mL
Creatinine(Crt), U: 65.4 mg/dL (ref 20.0–300.0)
Methadone Screen, Urine: NEGATIVE ng/mL
OXYCODONE+OXYMORPHONE UR QL SCN: NEGATIVE ng/mL
Opiate Scrn, Ur: NEGATIVE ng/mL
Ph of Urine: 6.1 (ref 4.5–8.9)
Phencyclidine Qn, Ur: NEGATIVE ng/mL
Propoxyphene Scrn, Ur: NEGATIVE ng/mL

## 2019-09-22 LAB — OBSTETRIC PANEL, INCLUDING HIV
Antibody Screen: NEGATIVE
Basophils Absolute: 0 10*3/uL (ref 0.0–0.2)
Basos: 0 %
EOS (ABSOLUTE): 0 10*3/uL (ref 0.0–0.4)
Eos: 0 %
HIV Screen 4th Generation wRfx: NONREACTIVE
Hematocrit: 39.6 % (ref 34.0–46.6)
Hemoglobin: 13.2 g/dL (ref 11.1–15.9)
Hepatitis B Surface Ag: NEGATIVE
Immature Grans (Abs): 0 10*3/uL (ref 0.0–0.1)
Immature Granulocytes: 0 %
Lymphocytes Absolute: 1.4 10*3/uL (ref 0.7–3.1)
Lymphs: 13 %
MCH: 30.8 pg (ref 26.6–33.0)
MCHC: 33.3 g/dL (ref 31.5–35.7)
MCV: 93 fL (ref 79–97)
Monocytes Absolute: 0.5 10*3/uL (ref 0.1–0.9)
Monocytes: 5 %
Neutrophils Absolute: 8.9 10*3/uL — ABNORMAL HIGH (ref 1.4–7.0)
Neutrophils: 82 %
Platelets: 290 10*3/uL (ref 150–450)
RBC: 4.28 x10E6/uL (ref 3.77–5.28)
RDW: 12.7 % (ref 11.7–15.4)
RPR Ser Ql: NONREACTIVE
Rh Factor: POSITIVE
Rubella Antibodies, IGG: 2.58 index (ref >0.99)
WBC: 10.8 10*3/uL (ref 3.4–10.8)

## 2019-09-22 LAB — HGB FRACTIONATION CASCADE
Hgb A2: 2.5 % (ref 1.8–3.2)
Hgb A: 97.5 % (ref 96.4–98.8)
Hgb F: 0 % (ref 0.0–2.0)
Hgb S: 0 %

## 2019-09-22 LAB — HEPATITIS C ANTIBODY: Hep C Virus Ab: <0.1 s/co ratio (ref 0.0–0.9)

## 2019-09-23 LAB — CYTOLOGY - PAP
Chlamydia: NEGATIVE
Comment: NEGATIVE
Comment: NEGATIVE
Comment: NORMAL
Diagnosis: NEGATIVE
High risk HPV: NEGATIVE
Neisseria Gonorrhea: NEGATIVE

## 2019-11-03 IMAGING — US US OB TRANSVAGINAL
1 series · 14 of 28 positions shown · non-contrast
Comparison: None.

CLINICAL DATA: Pregnant patient.  Size versus dates.

EXAM:
OBSTETRIC <14 WK US AND TRANSVAGINAL OB US
TECHNIQUE: Both transabdominal and transvaginal ultrasound examinations were
performed for complete evaluation of the gestation as well as the
maternal uterus, adnexal regions, and pelvic cul-de-sac.
Transvaginal technique was performed to assess early pregnancy.

[Series 1: us ob transvaginal · 14 of 87 slices shown]
[im 4/87]
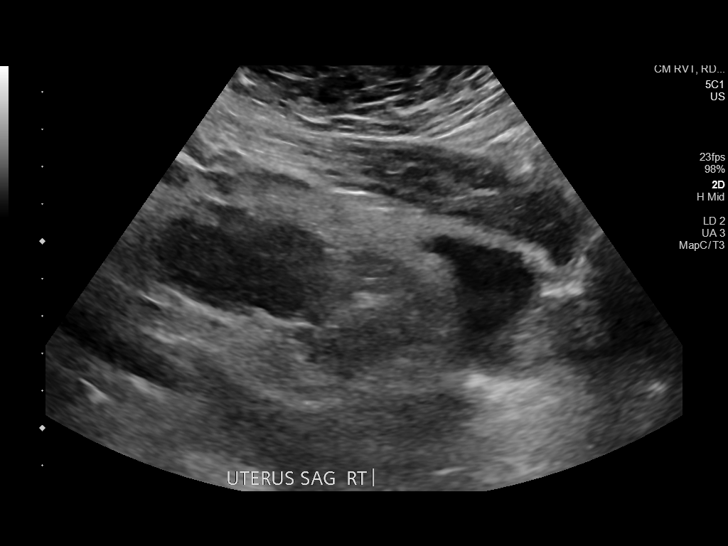
[im 10/87]
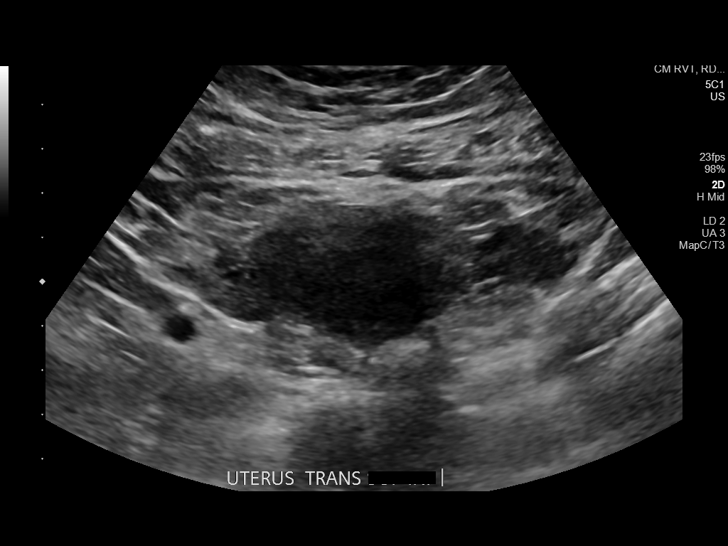
[im 16/87]
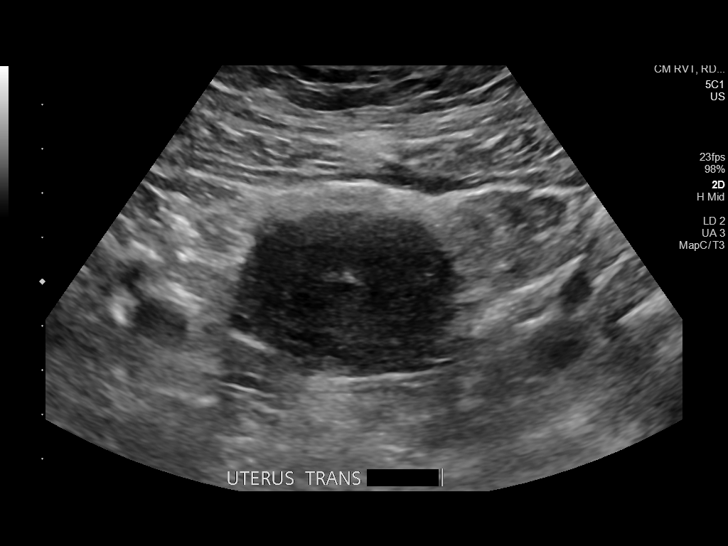
[im 23/87]
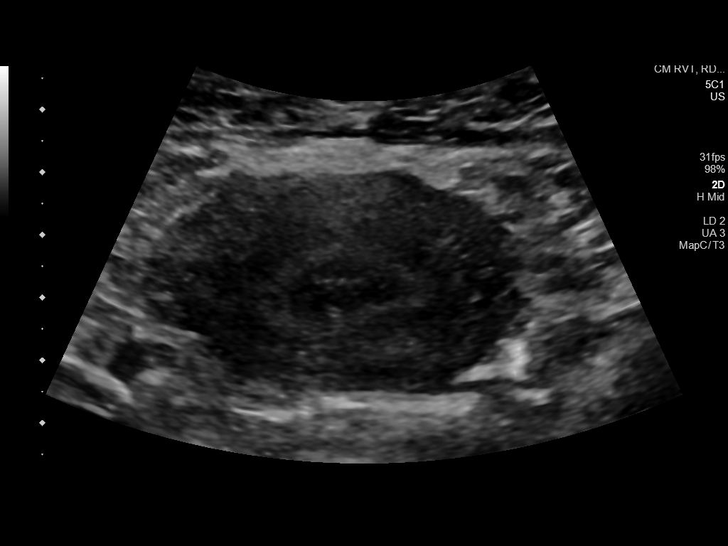
[im 29/87]
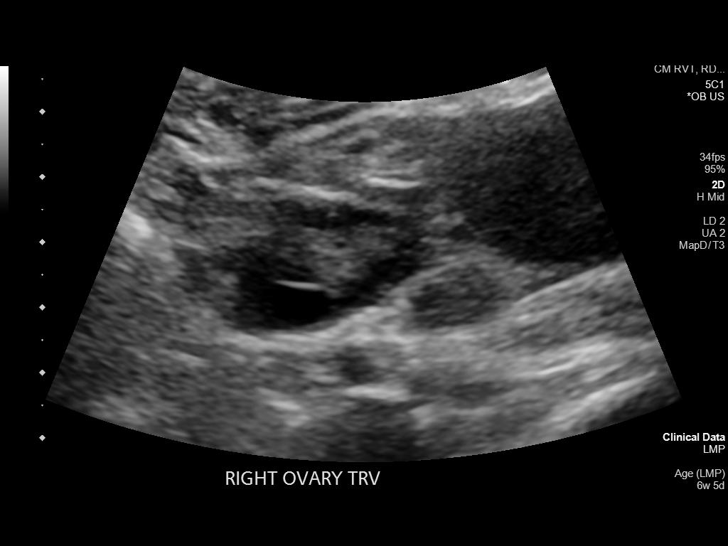
[im 36/87]
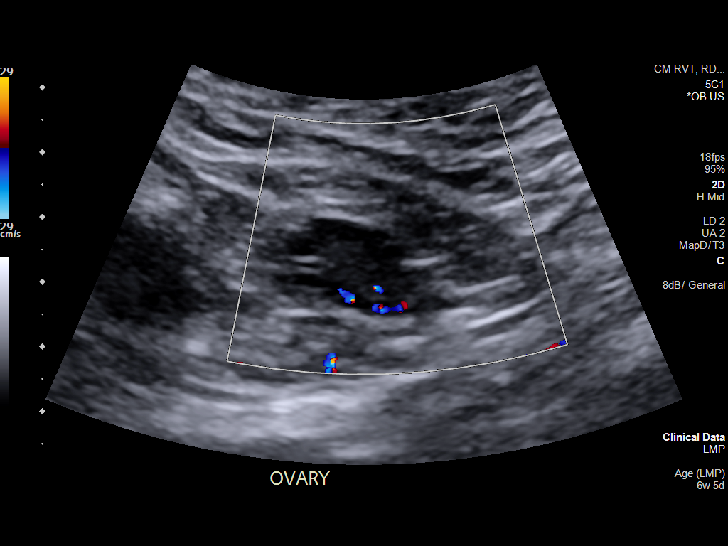
[im 42/87]
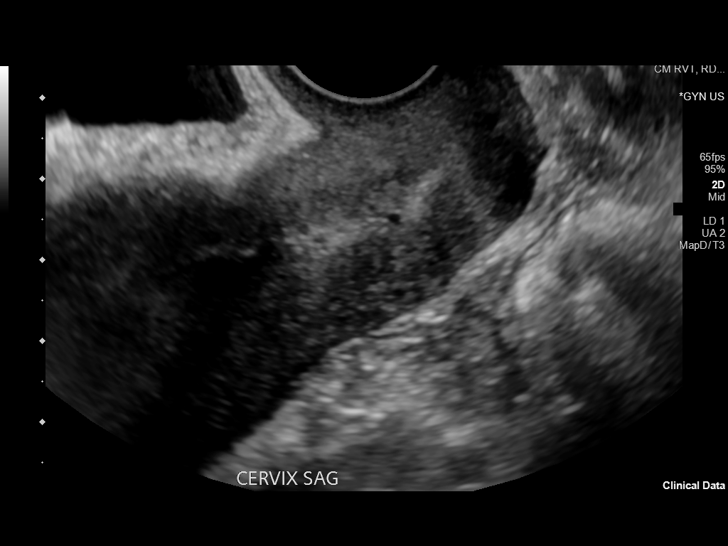
[im 48/87]
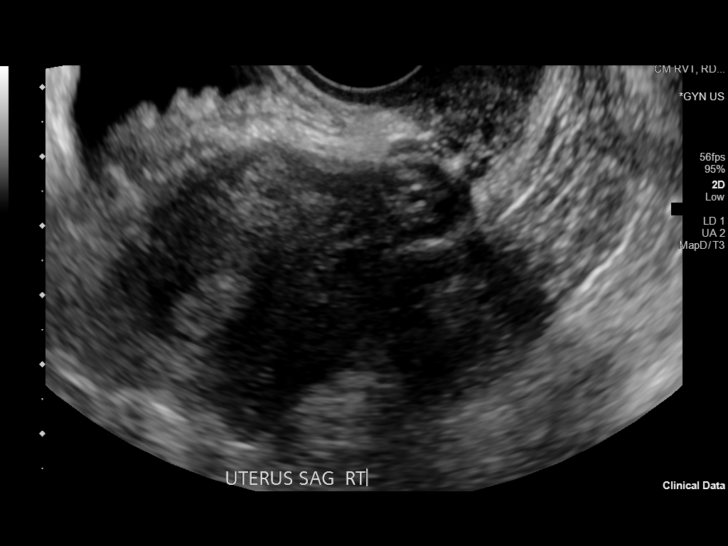
[im 55/87]
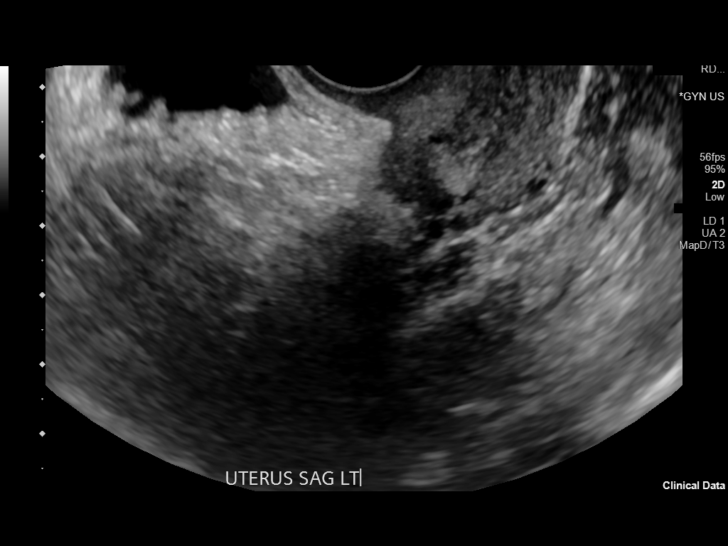
[im 61/87]
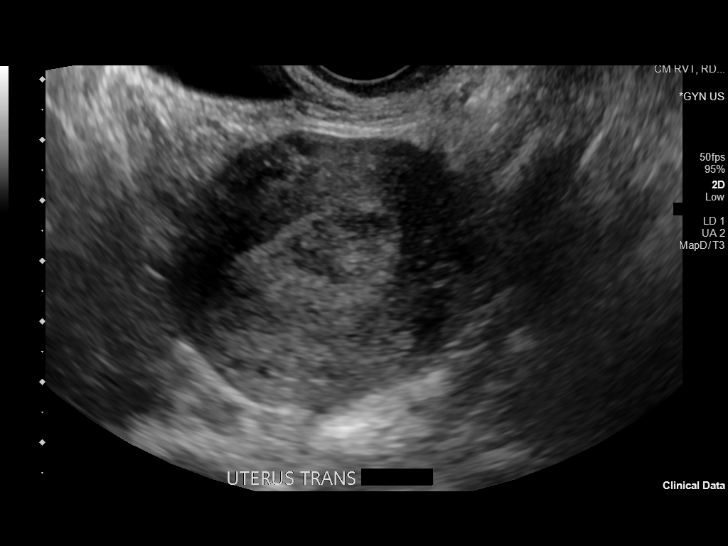
[im 67/87]
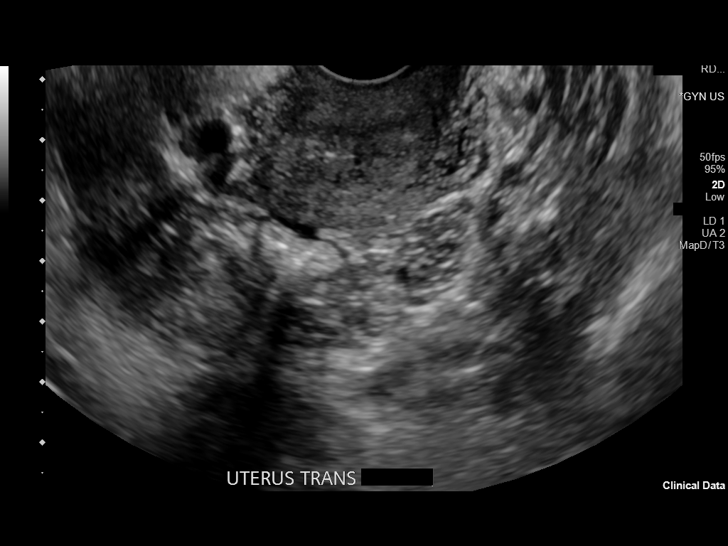
[im 74/87]
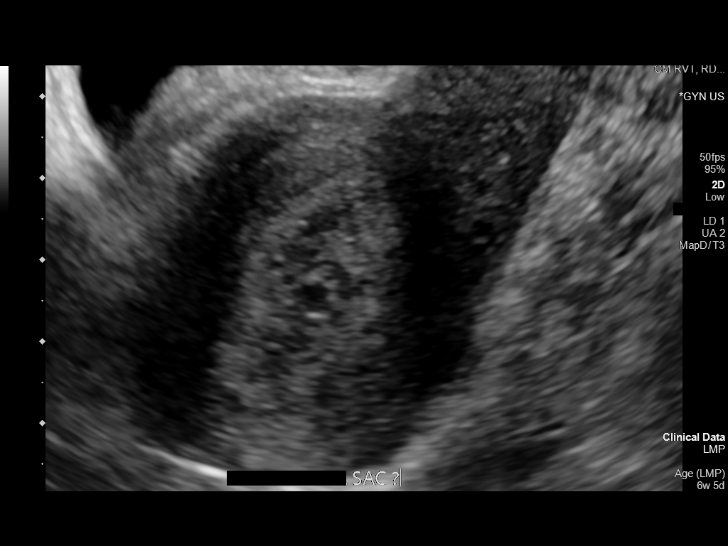
[im 80/87]
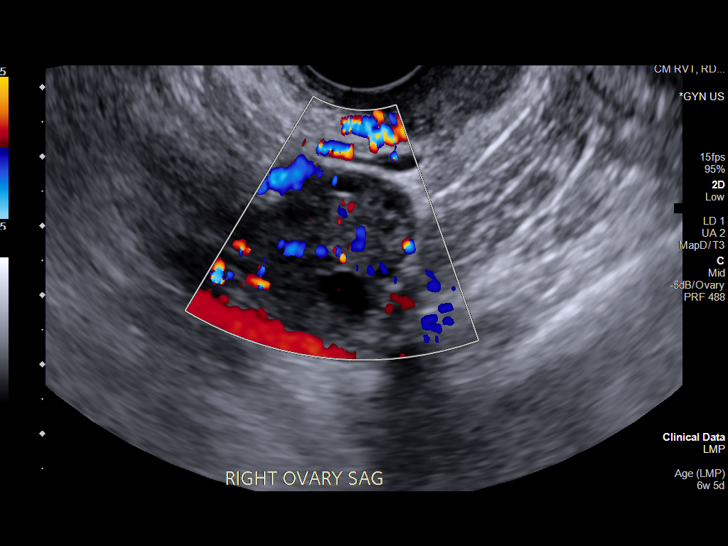
[im 87/87]
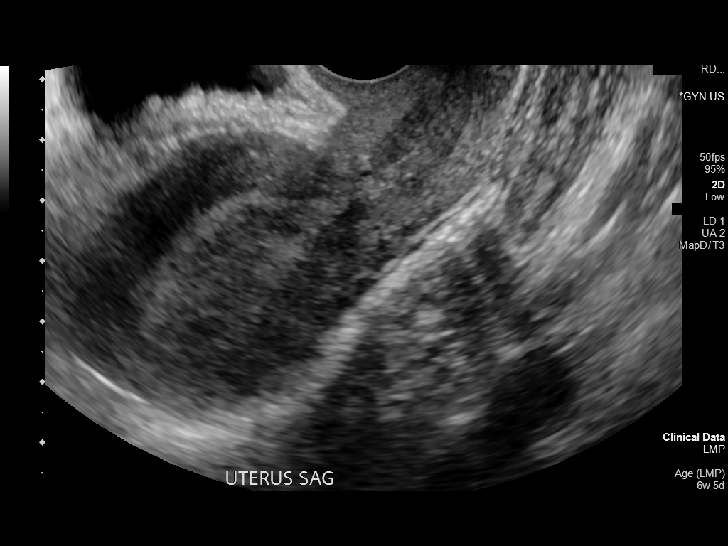

[14 of 28 positions shown; findings below may reference images not displayed]

FINDINGS: Intrauterine gestational sac: Probable

Yolk sac:  Not Visualized.

Embryo:  Not Visualized.

Cardiac Activity: Not Visualized.

MSD: 4.5 mm   5 w   2 d

Subchorionic hemorrhage:  None visualized.

Maternal uterus/adnexae: Normal right and left ovaries.
IMPRESSION: Probable early intrauterine gestational sac, but no yolk sac, fetal
pole, or cardiac activity yet visualized. Recommend follow-up
quantitative B-HCG levels and follow-up US in 14 days to assess
viability. This recommendation follows SRU consensus guidelines:
Diagnostic Criteria for Nonviable Pregnancy Early in the First
Trimester. N Engl J Med 5773; [DATE].

## 2019-11-06 ENCOUNTER — Other Ambulatory Visit: Payer: Self-pay

## 2019-11-06 ENCOUNTER — Encounter: Payer: Self-pay | Admitting: Obstetrics & Gynecology

## 2019-11-06 ENCOUNTER — Ambulatory Visit (INDEPENDENT_AMBULATORY_CARE_PROVIDER_SITE_OTHER): Payer: BC Managed Care – PPO

## 2019-11-06 ENCOUNTER — Ambulatory Visit (INDEPENDENT_AMBULATORY_CARE_PROVIDER_SITE_OTHER): Payer: BC Managed Care – PPO | Admitting: Obstetrics & Gynecology

## 2019-11-06 VITALS — BP 105/70 | HR 83 | Wt 240.5 lb

## 2019-11-06 DIAGNOSIS — Z3A19 19 weeks gestation of pregnancy: Secondary | ICD-10-CM | POA: Diagnosis not present

## 2019-11-06 DIAGNOSIS — Z3481 Encounter for supervision of other normal pregnancy, first trimester: Secondary | ICD-10-CM | POA: Diagnosis not present

## 2019-11-06 DIAGNOSIS — Z1389 Encounter for screening for other disorder: Secondary | ICD-10-CM

## 2019-11-06 DIAGNOSIS — Z331 Pregnant state, incidental: Secondary | ICD-10-CM

## 2019-11-06 DIAGNOSIS — Z363 Encounter for antenatal screening for malformations: Secondary | ICD-10-CM | POA: Diagnosis not present

## 2019-11-06 DIAGNOSIS — Z3482 Encounter for supervision of other normal pregnancy, second trimester: Secondary | ICD-10-CM

## 2019-11-06 DIAGNOSIS — Z1379 Encounter for other screening for genetic and chromosomal anomalies: Secondary | ICD-10-CM

## 2019-11-06 LAB — POCT URINALYSIS DIPSTICK OB
Glucose, UA: NEGATIVE
Ketones, UA: NEGATIVE
Nitrite, UA: NEGATIVE
POC,PROTEIN,UA: NEGATIVE

## 2019-11-06 MED ORDER — ASPIRIN EC 81 MG PO TBEC: 162.0000 mg | DELAYED_RELEASE_TABLET | Freq: Every day | ORAL | 11 refills | Status: DC

## 2019-11-06 NOTE — Addendum Note (Signed)
Addended by: Colen Darling on: 11/06/2019 09:57 AM   Modules accepted: Orders

## 2019-11-06 NOTE — Progress Notes (Signed)
[place] 20 wks,cephalic,anterior placenta gr 0,normal ovaries,cx 5.12 cm,fhr 157 bpm,svp of fluid 5.5 cm,efw 323 g 43%,anatomy complete,no obvious abnormalities

## 2019-11-06 NOTE — Progress Notes (Signed)
   LOW-RISK PREGNANCY VISIT Patient name: Vanessa Rice MRN [id number]  Date of birth: 08-Jun-1986 Chief Complaint:   Routine Prenatal Visit ([place] today)  History of Present Illness:   [name] is a 34 y.o. [phone] female at [redacted]w[redacted]d with an Estimated Date of Delivery: 03/25/20 being seen today for ongoing management of a low-risk pregnancy.  Depression screen PHQ 2/9 09/18/2019  Decreased Interest 0  Down, Depressed, Hopeless 0  PHQ - 2 Score 0    Today she reports ongoing altered taste/smell. Contractions: Not present. Vag. Bleeding: None.  Movement: Present. denies leaking of fluid. Review of Systems:   Pertinent items are noted in HPI Denies abnormal vaginal discharge w/ itching/odor/irritation, headaches, visual changes, shortness of breath, chest pain, abdominal pain, severe nausea/vomiting, or problems with urination or bowel movements unless otherwise stated above. Pertinent History Reviewed:  Reviewed past medical,surgical, social, obstetrical and family history.  Reviewed problem list, medications and allergies. Physical Assessment:   Vitals:   11/06/19 0933  BP: 105/70  Pulse: 83  Weight: 240 lb 8 oz (109.1 kg)  Body mass index is 42.6 kg/m.        Physical Examination:   General appearance: Well appearing, and in no distress  Mental status: Alert, oriented to person, place, and time  Skin: Warm & dry  Cardiovascular: Normal heart rate noted  Respiratory: Normal respiratory effort, no distress  Abdomen: Soft, gravid, nontender  Pelvic: Cervical exam deferred         Extremities: Edema: None  Fetal Status:     Movement: Present    Chaperone: n/a    Results for orders placed or performed in visit on 11/06/19 (from the past 24 hour(s))  POC Urinalysis Dipstick OB   Collection Time: 11/06/19  9:33 AM  Result Value Ref Range   Color, UA     Clarity, UA     Glucose, UA Negative Negative   Bilirubin, UA     Ketones, UA neg    Spec Grav, UA     Blood,  UA trace    pH, UA     POC,PROTEIN,UA Negative Negative, Trace, Small (1+), Moderate (2+), Large (3+), 4+   Urobilinogen, UA     Nitrite, UA neg    Leukocytes, UA Large (3+) (A) Negative   Appearance     Odor      Assessment & Plan:  1) Low-risk pregnancy [id number] at [redacted]w[redacted]d with an Estimated Date of Delivery: 03/25/20   2) Hx of pre eclampsia , on 162 mg ASA   Meds: No orders of the defined types were placed in this encounter.  Labs/procedures today: sonogram anatomy scan is normal, see report  Plan:  Continue routine obstetrical care  Next visit: prefers online    Reviewed: Preterm labor symptoms and general obstetric precautions including but not limited to vaginal bleeding, contractions, leaking of fluid and fetal movement were reviewed in detail with the patient.  All questions were answered. Has home bp cuff. Rx faxed to . Check bp weekly, let us know if >140/90.   Follow-up: No follow-ups on file.  Orders Placed This Encounter  Procedures  . POC Urinalysis Dipstick OB   [name]  11/06/2019 9:48 AM

## 2019-11-10 LAB — INTEGRATED 2
AFP MoM: 1.37
Alpha-Fetoprotein: 50.6 ng/mL
Crown Rump Length: 69.4 mm
DIA MoM: 2.9
DIA Value: 405.2 pg/mL
Estriol, Unconjugated: 1.51 ng/mL
Gest. Age on Collection Date: 13 weeks
Gestational Age: 20 weeks
Maternal Age at EDD: 34.4 yr
Nuchal Translucency (NT): 1.5 mm
Nuchal Translucency MoM: 0.95
Number of Fetuses: 1
PAPP-A MoM: 0.91
PAPP-A Value: 555.9 ng/mL
Test Results:: NEGATIVE
Weight: 241 [lb_av]
Weight: 241 [lb_av]
hCG MoM: 5.71
hCG Value: 86.8 IU/mL
uE3 MoM: 0.8

## 2019-12-04 ENCOUNTER — Telehealth (INDEPENDENT_AMBULATORY_CARE_PROVIDER_SITE_OTHER): Payer: BC Managed Care – PPO | Admitting: Obstetrics and Gynecology

## 2019-12-04 VITALS — BP 115/75 | HR 98

## 2019-12-04 DIAGNOSIS — Z3A24 24 weeks gestation of pregnancy: Secondary | ICD-10-CM

## 2019-12-04 DIAGNOSIS — Z98891 History of uterine scar from previous surgery: Secondary | ICD-10-CM

## 2019-12-04 DIAGNOSIS — Z3482 Encounter for supervision of other normal pregnancy, second trimester: Secondary | ICD-10-CM

## 2019-12-04 NOTE — Patient Instructions (Signed)
Cesarean Delivery, Care After This sheet gives you information about how to care for yourself after your procedure. Your health care provider may also give you more specific instructions. If you have problems or questions, contact your health care provider. What can I expect after the procedure? After the procedure, it is common to have:  A small amount of blood or clear fluid coming from the incision.  Some redness, swelling, and pain in your incision area.  Some abdominal pain and soreness.  Vaginal bleeding (lochia). Even though you did not have a vaginal delivery, you will still have vaginal bleeding and discharge.  Pelvic cramps.  Fatigue. You may have pain, swelling, and discomfort in the tissue between your vagina and your anus (perineum) if:  Your C-section was unplanned, and you were allowed to labor and push.  An incision was made in the area (episiotomy) or the tissue tore during attempted vaginal delivery. Follow these instructions at home: Incision care   Follow instructions from your health care provider about how to take care of your incision. Make sure you: ? Wash your hands with soap and water before you change your bandage (dressing). If soap and water are not available, use hand sanitizer. ? If you have a dressing, change it or remove it as told by your health care provider. ? Leave stitches (sutures), skin staples, skin glue, or adhesive strips in place. These skin closures may need to stay in place for 2 weeks or longer. If adhesive strip edges start to loosen and curl up, you may trim the loose edges. Do not remove adhesive strips completely unless your health care provider tells you to do that.  Check your incision area every day for signs of infection. Check for: ? More redness, swelling, or pain. ? More fluid or blood. ? Warmth. ? Pus or a bad smell.  Do not take baths, swim, or use a hot tub until your health care provider says it's okay. Ask your health  care provider if you can take showers.  When you cough or sneeze, hug a pillow. This helps with pain and decreases the chance of your incision opening up (dehiscing). Do this until your incision heals. Medicines  Take over-the-counter and prescription medicines only as told by your health care provider.  If you were prescribed an antibiotic medicine, take it as told by your health care provider. Do not stop taking the antibiotic even if you start to feel better.  Do not drive or use heavy machinery while taking prescription pain medicine. Lifestyle  Do not drink alcohol. This is especially important if you are breastfeeding or taking pain medicine.  Do not use any products that contain nicotine or tobacco, such as cigarettes, e-cigarettes, and chewing tobacco. If you need help quitting, ask your health care provider. Eating and drinking  Drink at least 8 eight-ounce glasses of water every day unless told not to by your health care provider. If you breastfeed, you may need to drink even more water.  Eat high-fiber foods every day. These foods may help prevent or relieve constipation. High-fiber foods include: ? Whole grain cereals and breads. ? Brown rice. ? Beans. ? Fresh fruits and vegetables. Activity   If possible, have someone help you care for your baby and help with household activities for at least a few days after you leave the hospital.  Return to your normal activities as told by your health care provider. Ask your health care provider what activities are safe for   you.  Rest as much as possible. Try to rest or take a nap while your baby is sleeping.  Do not lift anything that is heavier than 10 lbs (4.5 kg), or the limit that you were told, until your health care provider says that it is safe.  Talk with your health care provider about when you can engage in sexual activity. This may depend on your: ? Risk of infection. ? How fast you heal. ? Comfort and desire to  engage in sexual activity. General instructions  Do not use tampons or douches until your health care provider approves.  Wear loose, comfortable clothing and a supportive and well-fitting bra.  Keep your perineum clean and dry. Wipe from front to back when you use the toilet.  If you pass a blood clot, save it and call your health care provider to discuss. Do not flush blood clots down the toilet before you get instructions from your health care provider.  Keep all follow-up visits for you and your baby as told by your health care provider. This is important. Contact a health care provider if:  You have: ? A fever. ? Bad-smelling vaginal discharge. ? Pus or a bad smell coming from your incision. ? Difficulty or pain when urinating. ? A sudden increase or decrease in the frequency of your bowel movements. ? More redness, swelling, or pain around your incision. ? More fluid or blood coming from your incision. ? A rash. ? Nausea. ? Little or no interest in activities you used to enjoy. ? Questions about caring for yourself or your baby.  Your incision feels warm to the touch.  Your breasts turn red or become painful or hard.  You feel unusually sad or worried.  You vomit.  You pass a blood clot from your vagina.  You urinate more than usual.  You are dizzy or light-headed. Get help right away if:  You have: ? Pain that does not go away or get better with medicine. ? Chest pain. ? Difficulty breathing. ? Blurred vision or spots in your vision. ? Thoughts about hurting yourself or your baby. ? New pain in your abdomen or in one of your legs. ? A severe headache.  You faint.  You bleed from your vagina so much that you fill more than one sanitary pad in one hour. Bleeding should not be heavier than your heaviest period. Summary  After the procedure, it is common to have pain at your incision site, abdominal cramping, and slight bleeding from your vagina.  Check  your incision area every day for signs of infection.  Tell your health care provider about any unusual symptoms.  Keep all follow-up visits for you and your baby as told by your health care provider. This information is not intended to replace advice given to you by your health care provider. Make sure you discuss any questions you have with your health care provider. Document Revised: 12/25/2017 Document Reviewed: 12/25/2017 Elsevier Patient Education  2020 [employer]  

## 2019-12-04 NOTE — Progress Notes (Signed)
Patient ID: Vanessa Rice, female   DOB: 29-Dec-1985, 34 y.o.   MRN: [id number]    OBSTETRICS PRENATAL VIRTUAL VISIT ENCOUNTER NOTE  Provider location: [facility]   I connected with Danella Maiers on 12/04/2019 at 10:30 AM EDT by MyChart Video Encounter at home and verified that I am speaking with the correct person using two identifiers.   I discussed the limitations, risks, security and privacy concerns of performing an evaluation and management service virtually and the availability of in person appointments. I also discussed with the patient that there may be a patient responsible charge related to this service. The patient expressed understanding and agreed to proceed. Subjective:  Vanessa Rice is a 34 y.o. [phone] at [redacted]w[redacted]d being seen today for ongoing prenatal care.  She is currently monitored for the following issues for this low risk. pregnancy and has Hx of preeclampsia, prior pregnancy, currently pregnant; S/P primary low transverse C-section; Encounter for supervision of other normal pregnancy, unspecified trimester; and Encounter for supervision of other normal pregnancy, second trimester on their problem list.  Patient reports no complaints. She is unsure about birth control options after this pregnancy because she is considering having another baby in the future. The patient denies any aches or pains. Her blood pressures at home have been great. Contractions: Not present. Vag. Bleeding: None.  Movement: Present. Denies any leaking of fluid.   The following portions of the patient's history were reviewed and updated as appropriate: allergies, current medications, past family history, past medical history, past social history, past surgical history and problem list.   Objective:   Vitals:   12/04/19 1022  BP: 115/75  Pulse: 98    Fetal Status:     Movement: Present     General:  Alert, oriented and cooperative. Patient is in no  acute distress.  Respiratory: Normal respiratory effort, no problems with respiration noted  Mental Status: Normal mood and affect. Normal behavior. Normal judgment and thought content.  Rest of physical exam deferred due to type of encounter  Imaging: US OB Comp + 14 Wk  Result Date: 11/06/2019 SECOND TRIMESTER ANATOMY SONOGRAM Vanessa Rice is in the office for second trimester anatomy sonogram. She is a 34 y.o. year old [id number] with Estimated Date of Delivery: 03/25/20 by LMP now at  [redacted]w[redacted]d weeks gestation. Thus far the pregnancy has been complicated by hx of prior preeclampsia. GESTATION: SINGLETON PRESENTATION: cephalic FETAL ACTIVITY:          Heart rate         157          The fetus is active. AMNIOTIC FLUID: The amniotic fluid volume is  normal, SDP : 5.5 cm. PLACENTA LOCALIZATION:  anterior GRADE 0 CERVIX: Measures 5.1 cm ADNEXA: The ovaries are normal. GESTATIONAL AGE AND  BIOMETRICS: Gestational criteria: Estimated Date of Delivery: 03/25/20 by LMP now at [redacted]w[redacted]d Previous Scans:2          BIPARIETAL DIAMETER           4.6 cm         19+6 weeks HEAD CIRCUMFERENCE           17.64 cm         20 weeks ABDOMINAL CIRCUMFERENCE           14.73 cm         20 weeks FEMUR LENGTH           3.17 cm  19+5 weeks                                                       AVERAGE EGA(BY THIS SCAN):  19+5 weeks                                                 ESTIMATED FETAL WEIGHT:       323  grams, 43 % ANATOMICAL SURVEY                                                                            COMMENTS CEREBRAL VENTRICLES yes normal  CHOROID PLEXUS yes normal  CEREBELLUM yes normal  CISTERNA MAGNA yes normal  NUCHAL REGION yes normal  ORBITS yes normal  NASAL BONE yes normal  NOSE/LIP yes normal  FACIAL PROFILE yes normal  4 CHAMBERED HEART yes normal  OUTFLOW TRACTS yes normal  DIAPHRAGM yes normal  STOMACH yes normal  RENAL REGION yes normal  BLADDER yes normal  CORD INSERTION yes normal  3 VESSEL CORD yes  normal  SPINE yes normal  ARMS/HANDS yes normal  LEGS/FEET yes normal  GENITALIA yes normal female     SUSPECTED ABNORMALITIES:  no QUALITY OF SCAN: satisfactory TECHNICIAN COMMENTS: [place] 20 wks,cephalic,anterior placenta gr 0,normal ovaries,cx 5.12 cm,fhr 157 bpm,svp of fluid 5.5 cm,efw 323 g 43%,anatomy complete,no obvious abnormalities A copy of this report including all images has been saved and backed up to a second source for retrieval if needed. All measures and details of the anatomical scan, placentation, fluid volume and pelvic anatomy are contained in that report. Vanessa Rice 11/06/2019 9:21 AM Clinical Impression and recommendations: I have reviewed the sonogram results above, combined with the patient's current clinical course, below are my impressions and any appropriate recommendations for management based on the sonographic findings. 1.  [id number] Estimated Date of Delivery: 03/25/20 by  LMP, early ultrasound and confirmed by today's sonographic dating 2.  Normal fetal sonographic findings, specifically normal detailed anatomical evaluation,      no abnormalities noted 3.  Normal general sonographic findings Recommend routine prenatal care based on this sonogram or as clinically indicated [name] 11/06/2019 9:47 AM    Assessment and Plan:   1) Pregnancy: [id number] at [redacted]w[redacted]d  2) Hx of pre eclampsia , on 162 mg ASA  3) Planning for a repeat c section  Preterm labor symptoms and general obstetric precautions including but not limited to vaginal bleeding, contractions, leaking of fluid and fetal movement were reviewed in detail with the patient. I discussed the assessment and treatment plan with the patient. The patient was provided an opportunity to ask questions and all were answered. The patient agreed with the plan and demonstrated an understanding of the instructions. The patient was advised to call back or seek an in-person office evaluation/go to [facility] at [facility] for any  urgent or  concerning symptoms. Please refer to After Visit Summary for other counseling recommendations.   I provided 20 minutes of face-to-face time during this encounter + documentation.  Return in about 4 weeks (around 01/01/2020) for PN-2 Labs, LROB.  No future appointments.  By signing my name below, I, Pietro Cassis, attest that this documentation has been prepared under the direction and in the presence of Tilda Burrow, MD. Electronically Signed: Pietro Cassis, Medical Scribe. 12/04/19. 10:52 AM.  I personally performed the services described in this documentation, which was SCRIBED in my presence. The recorded information has been reviewed and considered accurate. It has been edited as necessary during review. Tilda Burrow, MD

## 2020-01-07 ENCOUNTER — Other Ambulatory Visit: Payer: BC Managed Care – PPO

## 2020-01-07 ENCOUNTER — Ambulatory Visit (INDEPENDENT_AMBULATORY_CARE_PROVIDER_SITE_OTHER): Payer: BC Managed Care – PPO | Admitting: Advanced Practice Midwife

## 2020-01-07 ENCOUNTER — Encounter: Payer: Self-pay | Admitting: Advanced Practice Midwife

## 2020-01-07 VITALS — BP 118/77 | HR 96 | Wt 241.5 lb

## 2020-01-07 DIAGNOSIS — O09293 Supervision of pregnancy with other poor reproductive or obstetric history, third trimester: Secondary | ICD-10-CM

## 2020-01-07 DIAGNOSIS — Z131 Encounter for screening for diabetes mellitus: Secondary | ICD-10-CM

## 2020-01-07 DIAGNOSIS — Z3A28 28 weeks gestation of pregnancy: Secondary | ICD-10-CM

## 2020-01-07 DIAGNOSIS — Z1389 Encounter for screening for other disorder: Secondary | ICD-10-CM

## 2020-01-07 DIAGNOSIS — Z3480 Encounter for supervision of other normal pregnancy, unspecified trimester: Secondary | ICD-10-CM

## 2020-01-07 DIAGNOSIS — Z23 Encounter for immunization: Secondary | ICD-10-CM | POA: Diagnosis not present

## 2020-01-07 DIAGNOSIS — Z331 Pregnant state, incidental: Secondary | ICD-10-CM

## 2020-01-07 DIAGNOSIS — O34219 Maternal care for unspecified type scar from previous cesarean delivery: Secondary | ICD-10-CM

## 2020-01-07 LAB — POCT URINALYSIS DIPSTICK OB
Glucose, UA: NEGATIVE
Ketones, UA: NEGATIVE
Nitrite, UA: NEGATIVE
POC,PROTEIN,UA: NEGATIVE

## 2020-01-07 NOTE — Patient Instructions (Signed)
Vanessa Rice, I greatly value your feedback.  If you receive a survey following your visit with [place] today, we appreciate you taking the time to fill it out.  Thanks, Cathie Beams, DNP, CNM  [facility] HAS MOVED!!! It is now [facility] ([address] [place], [place] [address]) Entrance located off of [address] Free 24/7 valet parking   Go to [email or web address] to register for FREE online childbirth classes    Call the office [phone]) or go to [facility] if:  You begin to have strong, frequent contractions  Your water breaks.  Sometimes it is a big gush of fluid, sometimes it is just a trickle that keeps getting your panties wet or running down your legs  You have vaginal bleeding.  It is normal to have a small amount of spotting if your cervix was checked.   You don't feel your baby moving like normal.  If you don't, get you something to eat and drink and lay down and focus on feeling your baby move.  You should feel at least 10 movements in 2 hours.  If you don't, you should call the office or go to [facility].   Home Blood Pressure Monitoring for Patients   Your provider has recommended that you check your blood pressure (BP) at least once a week at home. If you do not have a blood pressure cuff at home, one will be provided for you. Contact your provider if you have not received your monitor within 1 week.   Helpful Tips for Accurate Home Blood Pressure Checks  . Don't smoke, exercise, or drink caffeine 30 minutes before checking your BP . Use the restroom before checking your BP (a full bladder can raise your pressure) . Relax in a comfortable upright chair . Feet on the ground . Left arm resting comfortably on a flat surface at the level of your heart . Legs uncrossed . Back supported . Sit quietly and don't talk . Place the cuff on your bare arm . Adjust snuggly, so that only two fingertips can  fit between your skin and the top of the cuff . Check 2 readings separated by at least one minute . Keep a log of your BP readings . For a visual, please reference this diagram: http://ccnc.care/bpdiagram  Provider Name: [facility]     Phone: [phone]  Zone 1: ALL CLEAR  Continue to monitor your symptoms:  . BP reading is less than 140 (top number) or less than 90 (bottom number)  . No right upper stomach pain . No headaches or seeing spots . No feeling nauseated or throwing up . No swelling in face and hands  Zone 2: CAUTION Call your doctor's office for any of the following:  . BP reading is greater than 140 (top number) or greater than 90 (bottom number)  . Stomach pain under your ribs in the middle or right side . Headaches or seeing spots . Feeling nauseated or throwing up . Swelling in face and hands  Zone 3: EMERGENCY  Seek immediate medical care if you have any of the following:  . BP reading is greater than160 (top number) or greater than 110 (bottom number) . Severe headaches not improving with Tylenol . Serious difficulty catching your breath . Any worsening symptoms from Zone 2

## 2020-01-07 NOTE — Progress Notes (Signed)
   LOW-RISK PREGNANCY VISIT Patient name: Vanessa Rice MRN [id number]  Date of birth: [birth year] Chief Complaint:   Routine Prenatal Visit (PN2 today)  History of Present Illness:   Vanessa Rice is a 34 y.o. [phone] female at [redacted]w[redacted]d with an Estimated Date of Delivery: 03/25/20 being seen today for ongoing management of a low-risk pregnancy.  Today she reports no bleeding, no contractions, no cramping and no leaking. Does report that she continues to have an aversion to certain foods and smells, but is learning what to avoid to help with nausea. Contractions: Not present. Vag. Bleeding: None.  Movement: Present. denies leaking of fluid. Review of Systems:   Pertinent items are noted in HPI Denies abnormal vaginal discharge w/ itching/odor/irritation, headaches, visual changes, shortness of breath, chest pain, abdominal pain, severe nausea/vomiting, or problems with urination or bowel movements unless otherwise stated above. Pertinent History Reviewed:  Reviewed past medical,surgical, social, obstetrical and family history.  Reviewed problem list, medications and allergies. Physical Assessment:   Vitals:   01/07/20 0905  BP: 118/77  Pulse: 96  Weight: 241 lb 8 oz (109.5 kg)  Body mass index is 42.78 kg/m.        Physical Examination:   General appearance: Well appearing, and in no distress  Mental status: Alert, oriented to person, place, and time  Skin: Warm & dry  Cardiovascular: Normal heart rate noted  Respiratory: Normal respiratory effort, no distress  Abdomen: Soft, gravid, nontender  Pelvic: Cervical exam deferred         Extremities: Edema: Trace  Fetal Status: Fetal Heart Rate (bpm): 147 Fundal Height: 27 cm Movement: Present    Chaperone: n/a    Results for orders placed or performed in visit on 01/07/20 (from the past 24 hour(s))  POC Urinalysis Dipstick OB   Collection Time: 01/07/20  9:07 AM  Result Value Ref Range   Color, UA     Clarity, UA      Glucose, UA Negative Negative   Bilirubin, UA     Ketones, UA neg    Spec Grav, UA     Blood, UA trace    pH, UA     POC,PROTEIN,UA Negative Negative, Trace, Small (1+), Moderate (2+), Large (3+), 4+   Urobilinogen, UA     Nitrite, UA neg    Leukocytes, UA Moderate (2+) (A) Negative   Appearance     Odor      Assessment & Plan:  1) Low-risk pregnancy [id number] at [redacted]w[redacted]d with an Estimated Date of Delivery: 03/25/20   2) Hx of Preeclampsia, Continue with 182mg  ASA and checking BP at least weekly at home  3) Planning for Repeat C/S   Meds: No orders of the defined types were placed in this encounter.  Labs/procedures today: GTT test performed at [facility]. Will f/u with results as needed  Plan:  Continue routine obstetrical care. Next visit: prefers online    Reviewed: Preterm labor symptoms and general obstetric precautions including but not limited to vaginal bleeding, contractions, leaking of fluid and fetal movement were reviewed in detail with the patient.  All questions were answered. Has home bp cuff. Check bp weekly, let know if >140/90.   Follow-up: Return in about 3 weeks (around 01/28/2020) for [facility] Mychart visit.  Orders Placed This Encounter  Procedures  . Tdap vaccine greater than or equal to 7yo IM  . POC Urinalysis Dipstick OB   [facility] Catie Jacklyn Shell- NP 01/07/2020 2:08 PM

## 2020-01-08 LAB — CBC
Hematocrit: 34.9 % (ref 34.0–46.6)
Hemoglobin: 11.7 g/dL (ref 11.1–15.9)
MCH: 30.7 pg (ref 26.6–33.0)
MCHC: 33.5 g/dL (ref 31.5–35.7)
MCV: 92 fL (ref 79–97)
Platelets: 255 10*3/uL (ref 150–450)
RBC: 3.81 x10E6/uL (ref 3.77–5.28)
RDW: 13.1 % (ref 11.7–15.4)
WBC: 12.1 10*3/uL — ABNORMAL HIGH (ref 3.4–10.8)

## 2020-01-08 LAB — GLUCOSE TOLERANCE, 2 HOURS W/ 1HR
Glucose, 1 hour: 182 mg/dL — ABNORMAL HIGH (ref 65–179)
Glucose, 2 hour: 144 mg/dL (ref 65–152)
Glucose, Fasting: 82 mg/dL (ref 65–91)

## 2020-01-08 LAB — RPR: RPR Ser Ql: NONREACTIVE

## 2020-01-08 LAB — ANTIBODY SCREEN: Antibody Screen: NEGATIVE

## 2020-01-08 LAB — HIV ANTIBODY (ROUTINE TESTING W REFLEX): HIV Screen 4th Generation wRfx: NONREACTIVE

## 2020-01-12 ENCOUNTER — Other Ambulatory Visit: Payer: Self-pay | Admitting: Advanced Practice Midwife

## 2020-01-12 ENCOUNTER — Encounter: Payer: Self-pay | Admitting: Advanced Practice Midwife

## 2020-01-12 DIAGNOSIS — O24419 Gestational diabetes mellitus in pregnancy, unspecified control: Secondary | ICD-10-CM

## 2020-01-12 DIAGNOSIS — Z8632 Personal history of gestational diabetes: Secondary | ICD-10-CM | POA: Insufficient documentation

## 2020-01-12 HISTORY — DX: Gestational diabetes mellitus in pregnancy, unspecified control: O24.419

## 2020-01-12 NOTE — Progress Notes (Signed)
Nutritional consult ordered and mychart message re GDM.

## 2020-01-18 ENCOUNTER — Telehealth: Payer: Self-pay | Admitting: Advanced Practice Midwife

## 2020-01-18 NOTE — Telephone Encounter (Signed)
Pt got a message from Drenda Freeze that she should get a call from nutritionist  And if she had not to call and let us know. She has not heard from anyone as of today. Please advise.

## 2020-01-19 ENCOUNTER — Other Ambulatory Visit: Payer: Self-pay | Admitting: *Deleted

## 2020-01-19 ENCOUNTER — Telehealth: Payer: Self-pay | Admitting: *Deleted

## 2020-01-19 ENCOUNTER — Encounter: Payer: Self-pay | Admitting: *Deleted

## 2020-01-19 DIAGNOSIS — O0993 Supervision of high risk pregnancy, unspecified, third trimester: Secondary | ICD-10-CM

## 2020-01-19 DIAGNOSIS — O24410 Gestational diabetes mellitus in pregnancy, diet controlled: Secondary | ICD-10-CM

## 2020-01-19 MED ORDER — ACCU-CHEK GUIDE VI STRP: ORAL_STRIP | 12 refills | Status: DC

## 2020-01-19 MED ORDER — ACCU-CHEK SOFTCLIX LANCETS MISC: 12 refills | Status: DC

## 2020-01-19 MED ORDER — ACCU-CHEK GUIDE ME W/DEVICE KIT: 1.0000 | PACK | Freq: Four times a day (QID) | 0 refills | Status: DC

## 2020-01-19 NOTE — Telephone Encounter (Signed)
LMOVM that order was entered incorrectly.  Order has been corrected and message sent to staff.  Advised to let us know if she does not hear from them in a day or so.

## 2020-01-28 ENCOUNTER — Encounter: Payer: BC Managed Care – PPO | Attending: Advanced Practice Midwife | Admitting: Registered"

## 2020-01-28 ENCOUNTER — Ambulatory Visit: Payer: BC Managed Care – PPO | Admitting: Registered"

## 2020-01-28 ENCOUNTER — Other Ambulatory Visit: Payer: Self-pay

## 2020-01-28 DIAGNOSIS — O24410 Gestational diabetes mellitus in pregnancy, diet controlled: Secondary | ICD-10-CM | POA: Insufficient documentation

## 2020-01-28 DIAGNOSIS — O0993 Supervision of high risk pregnancy, unspecified, third trimester: Secondary | ICD-10-CM | POA: Insufficient documentation

## 2020-01-28 DIAGNOSIS — Z3A00 Weeks of gestation of pregnancy not specified: Secondary | ICD-10-CM | POA: Diagnosis not present

## 2020-01-29 ENCOUNTER — Encounter: Payer: Self-pay | Admitting: Obstetrics & Gynecology

## 2020-01-29 ENCOUNTER — Telehealth (INDEPENDENT_AMBULATORY_CARE_PROVIDER_SITE_OTHER): Payer: BC Managed Care – PPO | Admitting: Obstetrics & Gynecology

## 2020-01-29 VITALS — BP 104/75 | HR 88

## 2020-01-29 DIAGNOSIS — Z3A32 32 weeks gestation of pregnancy: Secondary | ICD-10-CM

## 2020-01-29 DIAGNOSIS — O0993 Supervision of high risk pregnancy, unspecified, third trimester: Secondary | ICD-10-CM

## 2020-01-29 DIAGNOSIS — O24410 Gestational diabetes mellitus in pregnancy, diet controlled: Secondary | ICD-10-CM

## 2020-01-29 NOTE — Progress Notes (Signed)
Patient was seen on 01/28/20 for Gestational Diabetes self-management. EDD 03/25/20. Patient states no history of GDM. Diet history obtained. Patient eats variety of all food groups. Beverages include water, occasional diet sun drop.   The following learning objectives were met by the patient :   States the definition of Gestational Diabetes  States why dietary management is important in controlling blood glucose  Describes the effects of carbohydrates on blood glucose levels  Demonstrates ability to create a balanced meal plan  Demonstrates carbohydrate counting   States when to check blood glucose levels  Demonstrates proper blood glucose monitoring techniques  States the effect of stress and exercise on blood glucose levels  States the importance of limiting caffeine and abstaining from alcohol and smoking  Plan:  Aim for 3 Carbohydrate Choices per meal (45 grams) +/- 1 either way  Aim for 1-2 Carbohydrate Choices per snack Begin reading food labels for Total Carbohydrate of foods If OK with your MD, consider  increasing your activity level by walking, Arm Chair Exercises or other activity daily as tolerated Begin checking Blood Glucose before breakfast and 2 hours after first bite of breakfast, lunch and dinner as directed by MD  Bring Log Book/Sheet and meter to every medical appointment  Take medication if directed by MD  Patient already has a meter and is testing pre breakfast and 2 hours after each meal. Review of Log Book shows:  FBS: 89-100 mg/dL; post meal 85-103 mg/dL  Patient instructed to monitor glucose levels: FBS: 60 - 95 mg/dl 2 hour: <120 mg/dl  Patient received the following handouts:  Nutrition Diabetes and Pregnancy  Carbohydrate Counting List  Blood glucose Log Sheet  Patient will be seen for follow-up as needed.

## 2020-01-29 NOTE — Progress Notes (Signed)
I connected with Vanessa Rice 01/29/20 at  9:10 AM EDT by: MyChart video and verified that I am speaking with the correct person using two identifiers.  Patient is located at home and provider is located at office.     The purpose of this virtual visit is to provide medical care while limiting exposure to the novel coronavirus. I discussed the limitations, risks, security and privacy concerns of performing an evaluation and management service by MyChart video and the availability of in person appointments. I also discussed with the patient that there may be a patient responsible charge related to this service. By engaging in this virtual visit, you consent to the provision of healthcare.  Additionally, you authorize for your insurance to be billed for the services provided during this visit.  The patient expressed understanding and agreed to proceed.  The following staff members participated in the virtual visit:  Faith Rogue    PRENATAL VISIT NOTE  Subjective:  Vanessa Rice is a 34 y.o. [id number] at [redacted]w[redacted]d  for phone visit for ongoing prenatal care.  She is currently monitored for the following issues for this high-risk pregnancy and has Hx of preeclampsia, prior pregnancy, currently pregnant; S/P primary low transverse C-section; Supervision of high-risk pregnancy; Encounter for supervision of other normal pregnancy, second trimester; and Gestational diabetes on their problem list.  Patient reports no complaints.  Contractions: Not present. Vag. Bleeding: None.  Movement: Present. Denies leaking of fluid.   The following portions of the patient's history were reviewed and updated as appropriate: allergies, current medications, past family history, past medical history, past social history, past surgical history and problem list.   Objective:   Vitals:   01/29/20 0915  BP: 104/75  Pulse: 88   Self-Obtained No other complaints doing well Fetal Status:     Movement: Present      Assessment and Plan:  Pregnancy: G4P1021 at [redacted]w[redacted]d 1. Diet controlled gestational diabetes mellitus (GDM) in third trimester Fastings are slightly above desired level, 95-98, adjusting diet but I am concerned may require oral hypoglycemic in evening  2. Supervision of high risk pregnancy in third trimester  3.  Previous c section will try to set up repeat 03/19/20   Preterm labor symptoms and general obstetric precautions including but not limited to vaginal bleeding, contractions, leaking of fluid and fetal movement were reviewed in detail with the patient.  Return in about 2 weeks (around 02/12/2020) for HROB.  No future appointments.   Time spent on virtual visit: 11 minutes  [name], MD

## 2020-02-01 ENCOUNTER — Other Ambulatory Visit: Payer: Self-pay

## 2020-02-10 ENCOUNTER — Encounter: Payer: Self-pay | Admitting: Advanced Practice Midwife

## 2020-02-10 ENCOUNTER — Ambulatory Visit (INDEPENDENT_AMBULATORY_CARE_PROVIDER_SITE_OTHER): Payer: BC Managed Care – PPO | Admitting: Advanced Practice Midwife

## 2020-02-10 VITALS — BP 113/77 | HR 83 | Wt 242.0 lb

## 2020-02-10 DIAGNOSIS — Z3A33 33 weeks gestation of pregnancy: Secondary | ICD-10-CM

## 2020-02-10 DIAGNOSIS — O24410 Gestational diabetes mellitus in pregnancy, diet controlled: Secondary | ICD-10-CM

## 2020-02-10 DIAGNOSIS — O0993 Supervision of high risk pregnancy, unspecified, third trimester: Secondary | ICD-10-CM

## 2020-02-10 DIAGNOSIS — Z1389 Encounter for screening for other disorder: Secondary | ICD-10-CM

## 2020-02-10 DIAGNOSIS — Z331 Pregnant state, incidental: Secondary | ICD-10-CM

## 2020-02-10 LAB — POCT URINALYSIS DIPSTICK OB
Blood, UA: NEGATIVE
Glucose, UA: NEGATIVE
Nitrite, UA: NEGATIVE
POC,PROTEIN,UA: NEGATIVE

## 2020-02-10 NOTE — Progress Notes (Signed)
HIGH-RISK PREGNANCY VISIT Patient name: Vanessa Rice MRN [id number]  Date of birth: 1986-01-02 Chief Complaint:   Routine Prenatal Visit  History of Present Illness:   Vanessa Rice is a 34 y.o. [phone] female at [redacted]w[redacted]d with an Estimated Date of Delivery: 03/25/20 being seen today for ongoing management of a high-risk pregnancy complicated by diabetes mellitus A1DM.  Today she reports no complaints (denies s/s UTI).  Had nutritionist visit; fastings: all <90, 2hr postprandials: all <120  Depression screen [facility] 2/9 02/01/2020 09/18/2019  Decreased Interest 0 0  Down, Depressed, Hopeless 0 0  PHQ - 2 Score 0 0  Altered sleeping 0 -  Tired, decreased energy 1 -  Change in appetite 0 -  Feeling bad or failure about yourself  0 -  Trouble concentrating 0 -  Moving slowly or fidgety/restless 0 -  Suicidal thoughts 0 -  PHQ-9 Score 1 -    Contractions: Not present.  .  Movement: Present. denies leaking of fluid.  Review of Systems:   Pertinent items are noted in HPI Denies abnormal vaginal discharge w/ itching/odor/irritation, headaches, visual changes, shortness of breath, chest pain, abdominal pain, severe nausea/vomiting, or problems with urination or bowel movements unless otherwise stated above. Pertinent History Reviewed:  Reviewed past medical,surgical, social, obstetrical and family history.  Reviewed problem list, medications and allergies. Physical Assessment:   Vitals:   02/10/20 1616  BP: 113/77  Pulse: 83  Weight: 242 lb (109.8 kg)  Body mass index is 42.87 kg/m.           Physical Examination:   General appearance: alert, well appearing, and in no distress  Mental status: alert, oriented to person, place, and time  Skin: warm & dry   Extremities: Edema: None    Cardiovascular: normal heart rate noted  Respiratory: normal respiratory effort, no distress  Abdomen: gravid, soft, non-tender  Pelvic: Cervical exam deferred         Fetal Status: Fetal Heart  Rate (bpm): 145 Fundal Height: 35 cm Movement: Present    Fetal Surveillance Testing today: doppler    Results for orders placed or performed in visit on 02/10/20 (from the past 24 hour(s))  POC Urinalysis Dipstick OB   Collection Time: 02/10/20  4:14 PM  Result Value Ref Range   Color, UA     Clarity, UA     Glucose, UA Negative Negative   Bilirubin, UA     Ketones, UA large    Spec Grav, UA     Blood, UA neg    pH, UA     POC,PROTEIN,UA Negative Negative, Trace, Small (1+), Moderate (2+), Large (3+), 4+   Urobilinogen, UA     Nitrite, UA neg    Leukocytes, UA Small (1+) (A) Negative   Appearance     Odor      Assessment & Plan:  1) High-risk pregnancy [id number] at [redacted]w[redacted]d with an Estimated Date of Delivery: 03/25/20   2) GDMA1, stable; will get growth scan with next visit  3) Hx of pre-e, continue on bASA until 36wks  Meds: No orders of the defined types were placed in this encounter.   Labs/procedures today: none  Treatment Plan:  U/S and GBS/cultures with next visit  Reviewed: Preterm labor symptoms and general obstetric precautions including but not limited to vaginal bleeding, contractions, leaking of fluid and fetal movement were reviewed in detail with the patient.  All questions were answered. Has home bp cuff. Check bp weekly, let us  know if >140/90.   Follow-up: Return in about 16 days (around 02/26/2020) for HROB, in person; GBS & cultures, [place]: EFW.  Orders Placed This Encounter  Procedures  . US OB Follow Up  . POC Urinalysis Dipstick OB   Arabella Merles [facility] 02/10/2020 4:43 PM

## 2020-02-26 ENCOUNTER — Encounter: Payer: Self-pay | Admitting: Obstetrics and Gynecology

## 2020-02-26 ENCOUNTER — Ambulatory Visit (INDEPENDENT_AMBULATORY_CARE_PROVIDER_SITE_OTHER): Payer: BC Managed Care – PPO | Admitting: Obstetrics and Gynecology

## 2020-02-26 ENCOUNTER — Other Ambulatory Visit (HOSPITAL_COMMUNITY)
Admission: RE | Admit: 2020-02-26 | Discharge: 2020-02-26 | Disposition: A | Discharge status: Home / Self Care | Payer: BC Managed Care – PPO | Source: Ambulatory Visit | Attending: Obstetrics and Gynecology | Admitting: Obstetrics and Gynecology

## 2020-02-26 ENCOUNTER — Ambulatory Visit (INDEPENDENT_AMBULATORY_CARE_PROVIDER_SITE_OTHER): Payer: BC Managed Care – PPO

## 2020-02-26 VITALS — BP 124/78 | HR 78

## 2020-02-26 DIAGNOSIS — O24410 Gestational diabetes mellitus in pregnancy, diet controlled: Secondary | ICD-10-CM | POA: Diagnosis not present

## 2020-02-26 DIAGNOSIS — O0993 Supervision of high risk pregnancy, unspecified, third trimester: Secondary | ICD-10-CM

## 2020-02-26 DIAGNOSIS — Z1389 Encounter for screening for other disorder: Secondary | ICD-10-CM

## 2020-02-26 DIAGNOSIS — Z3A36 36 weeks gestation of pregnancy: Secondary | ICD-10-CM | POA: Diagnosis present

## 2020-02-26 DIAGNOSIS — Z331 Pregnant state, incidental: Secondary | ICD-10-CM

## 2020-02-26 LAB — POCT URINALYSIS DIPSTICK OB
Blood, UA: NEGATIVE
Glucose, UA: NEGATIVE
Ketones, UA: NEGATIVE
Nitrite, UA: NEGATIVE
POC,PROTEIN,UA: NEGATIVE

## 2020-02-26 NOTE — Progress Notes (Signed)
[place] 36 wks,cephalic,anterior placenta gr 2,afi 17.9 cm,fhr 169 bpm,EFW 2839 g 53%

## 2020-02-26 NOTE — Progress Notes (Signed)
Patient ID: Vanessa Rice, female   DOB: 10/17/1985, 34 y.o.   MRN: [id number]    [facility] PREGNANCY VISIT Patient name: Vanessa Rice MRN [id number]  Date of birth: May 14, 1986 Chief Complaint:   Routine Prenatal Visit (u/s, cultures)  History of Present Illness:   Vanessa Rice is a 34 y.o. [phone] female at [redacted]w[redacted]d with an Estimated Date of Delivery: 03/25/20 being seen today for ongoing management of a high-risk pregnancy complicated by A1DM.  Today she reports foot swelling. The patient was positive for group B strep with her pregnancy in 2017.  Depression screen [facility] 2/9 02/01/2020 09/18/2019  Decreased Interest 0 0  Down, Depressed, Hopeless 0 0  PHQ - 2 Score 0 0  Altered sleeping 0 -  Tired, decreased energy 1 -  Change in appetite 0 -  Feeling bad or failure about yourself  0 -  Trouble concentrating 0 -  Moving slowly or fidgety/restless 0 -  Suicidal thoughts 0 -  PHQ-9 Score 1 -    Contractions: Not present. Vag. Bleeding: None.  Movement: Present. denies leaking of fluid.  Review of Systems:   Pertinent items are noted in HPI Denies abnormal vaginal discharge w/ itching/odor/irritation, headaches, visual changes, shortness of breath, chest pain, abdominal pain, severe nausea/vomiting, or problems with urination or bowel movements unless otherwise stated above. Pertinent History Reviewed:  Reviewed past medical,surgical, social, obstetrical and family history.  Reviewed problem list, medications and allergies. Physical Assessment:   Vitals:   02/26/20 0955  BP: 124/78  Pulse: 78  There is no height or weight on file to calculate BMI.           Physical Examination:   General appearance: alert, well appearing, and in no distress and oriented to person, place, and time  Mental status: alert, oriented to person, place, and time, normal mood, behavior, speech, dress, motor activity, and thought processes  Skin: warm & dry   Extremities: Edema: Trace      Cardiovascular: normal heart rate noted  Respiratory: normal respiratory effort, no distress  Abdomen: gravid, soft, non-tender  Pelvic: Cervical exam deferred         Fetal Status: Fetal Heart Rate (bpm): 169 u/s   Movement: Present    Fetal Surveillance Testing today: [place] 36 wks,cephalic,anterior placenta gr 2,afi 17.9 cm,fhr 169 bpm,EFW 2839 g 53%  Chaperone: Pietro Cassis  Results for orders placed or performed in visit on 02/26/20 (from the past 24 hour(s))  POC Urinalysis Dipstick OB   Collection Time: 02/26/20  9:56 AM  Result Value Ref Range   Color, UA     Clarity, UA     Glucose, UA Negative Negative   Bilirubin, UA     Ketones, UA neg    Spec Grav, UA     Blood, UA neg    pH, UA     POC,PROTEIN,UA Negative Negative, Trace, Small (1+), Moderate (2+), Large (3+), 4+   Urobilinogen, UA     Nitrite, UA neg    Leukocytes, UA Moderate (2+) (A) Negative   Appearance     Odor      Assessment & Plan:  1) High-risk pregnancy G4P1021 at [redacted]w[redacted]d with an Estimated Date of Delivery: 03/25/20   2) A1DM  3) Hx of pre-e, continue on bASA until 36wks  Meds: No orders of the defined types were placed in this encounter.  Labs/procedures today: GBS, GC/CHL  Treatment Plan:  Repeat C section scheduled for 03/19/2020  Reviewed: Preterm labor  symptoms and general obstetric precautions including but not limited to vaginal bleeding, contractions, leaking of fluid and fetal movement were reviewed in detail with the patient.  All questions were answered. Has home bp cuff. Check bp weekly, let us know if >140/90.   Follow-up: Return in about 1 week (around 03/04/2020) for HROB.  Orders Placed This Encounter  Procedures  . Culture, beta strep (group b only)  . POC Urinalysis Dipstick OB   02/26/2020 10:33 AM  By signing my name below, I, Pietro Cassis, attest that this documentation has been prepared under the direction and in the presence of Tilda Burrow, MD. Electronically  Signed: Pietro Cassis, Medical Scribe. 02/26/20. 10:33 AM.  I personally performed the services described in this documentation, which was SCRIBED in my presence. The recorded information has been reviewed and considered accurate. It has been edited as necessary during review. Tilda Burrow, MD

## 2020-02-29 LAB — CERVICOVAGINAL ANCILLARY ONLY
Chlamydia: NEGATIVE
Comment: NEGATIVE
Comment: NORMAL
Neisseria Gonorrhea: NEGATIVE

## 2020-03-01 LAB — CULTURE, BETA STREP (GROUP B ONLY): Strep Gp B Culture: POSITIVE — AB

## 2020-03-03 ENCOUNTER — Telehealth (INDEPENDENT_AMBULATORY_CARE_PROVIDER_SITE_OTHER): Payer: BC Managed Care – PPO | Admitting: Advanced Practice Midwife

## 2020-03-03 VITALS — BP 127/77 | HR 80

## 2020-03-03 DIAGNOSIS — Z3A36 36 weeks gestation of pregnancy: Secondary | ICD-10-CM

## 2020-03-03 DIAGNOSIS — O24410 Gestational diabetes mellitus in pregnancy, diet controlled: Secondary | ICD-10-CM

## 2020-03-03 DIAGNOSIS — O0993 Supervision of high risk pregnancy, unspecified, third trimester: Secondary | ICD-10-CM

## 2020-03-03 NOTE — Patient Instructions (Signed)

## 2020-03-03 NOTE — Progress Notes (Signed)
   TELEHEALTH OBSTETRICS VISIT ENCOUNTER NOTE  I connected with Vanessa Rice on 03/03/20 at  4:10 PM EDT by telephone at home and verified that I am speaking with the correct person using two identifiers.   I discussed the limitations, risks, security and privacy concerns of performing an evaluation and management service by telephone and the availability of in person appointments. I also discussed with the patient that there may be a patient responsible charge related to this service. The patient expressed understanding and agreed to proceed.  Subjective:  Vanessa Rice is a 34 y.o. [phone] at [redacted]w[redacted]d being followed for ongoing prenatal care.  She is currently monitored for the following issues for this high-risk pregnancy and has Hx of preeclampsia, prior pregnancy, currently pregnant; S/P primary low transverse C-section; Supervision of high-risk pregnancy; and Gestational diabetes on their problem list.  Patient reports FBS <95 and 2 hour pp <120. Reports fetal movement. Denies any contractions, bleeding or leaking of fluid.   The following portions of the patient's history were reviewed and updated as appropriate: allergies, current medications, past family history, past medical history, past social history, past surgical history and problem list.   Objective:   General:  Alert, oriented and cooperative.   Mental Status: Normal mood and affect perceived. Normal judgment and thought content.  Rest of physical exam deferred due to type of encounter  Assessment and Plan:  Pregnancy: G4W1027 at [redacted]w[redacted]d There are no diagnoses linked to this encounter. Term labor symptoms and general obstetric precautions including but not limited to vaginal bleeding, contractions, leaking of fluid and fetal movement were reviewed in detail with the patient.  I discussed the assessment and treatment plan with the patient. The patient was provided an opportunity to ask questions and all were answered.  The patient agreed with the plan and demonstrated an understanding of the instructions. The patient was advised to call back or seek an in-person office evaluation/go to [facility]U at [facility] for any urgent or concerning symptoms. Please refer to After Visit Summary for other counseling recommendations.   I provided 10 minutes of non-face-to-face time during this encounter.  Return in about 1 week (around 03/10/2020) for LROB.  No future appointments.  Jacklyn Shell, CNM Center for [employer], [facility]

## 2020-03-04 ENCOUNTER — Encounter: Payer: Self-pay | Admitting: Obstetrics and Gynecology

## 2020-03-04 DIAGNOSIS — O0993 Supervision of high risk pregnancy, unspecified, third trimester: Secondary | ICD-10-CM

## 2020-03-04 NOTE — Progress Notes (Signed)
GBS POS. For scheduled C/S 03/19/20

## 2020-03-08 ENCOUNTER — Other Ambulatory Visit: Payer: Self-pay

## 2020-03-08 ENCOUNTER — Inpatient Hospital Stay (HOSPITAL_COMMUNITY): Payer: BC Managed Care – PPO | Admitting: Anesthesiology

## 2020-03-08 ENCOUNTER — Encounter (HOSPITAL_COMMUNITY): Payer: Self-pay | Admitting: Family Medicine

## 2020-03-08 ENCOUNTER — Encounter (HOSPITAL_COMMUNITY)
Admission: AD | Disposition: A | Discharge status: Home / Self Care | Payer: Self-pay | Source: Home / Self Care | Attending: Family Medicine

## 2020-03-08 ENCOUNTER — Inpatient Hospital Stay (HOSPITAL_COMMUNITY)
Admission: AD | Admit: 2020-03-08 | Discharge: 2020-03-10 | DRG: 788 | Disposition: A | Discharge status: Home / Self Care | Payer: BC Managed Care – PPO | Attending: Family Medicine | Admitting: Family Medicine

## 2020-03-08 DIAGNOSIS — O134 Gestational [pregnancy-induced] hypertension without significant proteinuria, complicating childbirth: Secondary | ICD-10-CM | POA: Diagnosis present

## 2020-03-08 DIAGNOSIS — O139 Gestational [pregnancy-induced] hypertension without significant proteinuria, unspecified trimester: Secondary | ICD-10-CM | POA: Diagnosis present

## 2020-03-08 DIAGNOSIS — O34211 Maternal care for low transverse scar from previous cesarean delivery: Principal | ICD-10-CM | POA: Diagnosis present

## 2020-03-08 DIAGNOSIS — O24420 Gestational diabetes mellitus in childbirth, diet controlled: Secondary | ICD-10-CM | POA: Diagnosis present

## 2020-03-08 DIAGNOSIS — O133 Gestational [pregnancy-induced] hypertension without significant proteinuria, third trimester: Secondary | ICD-10-CM

## 2020-03-08 DIAGNOSIS — O24410 Gestational diabetes mellitus in pregnancy, diet controlled: Secondary | ICD-10-CM

## 2020-03-08 DIAGNOSIS — O99824 Streptococcus B carrier state complicating childbirth: Secondary | ICD-10-CM

## 2020-03-08 DIAGNOSIS — Z3A37 37 weeks gestation of pregnancy: Secondary | ICD-10-CM

## 2020-03-08 DIAGNOSIS — O99214 Obesity complicating childbirth: Secondary | ICD-10-CM | POA: Diagnosis present

## 2020-03-08 DIAGNOSIS — Z8632 Personal history of gestational diabetes: Secondary | ICD-10-CM | POA: Diagnosis present

## 2020-03-08 DIAGNOSIS — Z20822 Contact with and (suspected) exposure to covid-19: Secondary | ICD-10-CM | POA: Diagnosis present

## 2020-03-08 DIAGNOSIS — Z98891 History of uterine scar from previous surgery: Secondary | ICD-10-CM

## 2020-03-08 LAB — CBC
HCT: 36.4 % (ref 36.0–46.0)
Hemoglobin: 12.1 g/dL (ref 12.0–15.0)
MCH: 31.2 pg (ref 26.0–34.0)
MCHC: 33.2 g/dL (ref 30.0–36.0)
MCV: 93.8 fL (ref 80.0–100.0)
Platelets: 216 10*3/uL (ref 150–400)
RBC: 3.88 MIL/uL (ref 3.87–5.11)
RDW: 13.5 % (ref 11.5–15.5)
WBC: 10.6 10*3/uL — ABNORMAL HIGH (ref 4.0–10.5)
nRBC: 0 % (ref 0.0–0.2)

## 2020-03-08 LAB — COMPREHENSIVE METABOLIC PANEL
ALT: 15 U/L (ref 0–44)
AST: 19 U/L (ref 15–41)
Albumin: 2.6 g/dL — ABNORMAL LOW (ref 3.5–5.0)
Alkaline Phosphatase: 93 U/L (ref 38–126)
Anion gap: 11 (ref 5–15)
BUN: 11 mg/dL (ref 6–20)
CO2: 18 mmol/L — ABNORMAL LOW (ref 22–32)
Calcium: 8.7 mg/dL — ABNORMAL LOW (ref 8.9–10.3)
Chloride: 108 mmol/L (ref 98–111)
Creatinine, Ser: 0.7 mg/dL (ref 0.44–1.00)
GFR calc Af Amer: >60 mL/min (ref >60)
GFR calc non Af Amer: >60 mL/min (ref >60)
Glucose, Bld: 74 mg/dL (ref 70–99)
Potassium: 3.7 mmol/L (ref 3.5–5.1)
Sodium: 137 mmol/L (ref 135–145)
Total Bilirubin: 0.3 mg/dL (ref 0.3–1.2)
Total Protein: 5.8 g/dL — ABNORMAL LOW (ref 6.5–8.1)

## 2020-03-08 LAB — URINALYSIS, ROUTINE W REFLEX MICROSCOPIC
Bilirubin Urine: NEGATIVE
Glucose, UA: NEGATIVE mg/dL
Hgb urine dipstick: NEGATIVE
Ketones, ur: NEGATIVE mg/dL
Nitrite: NEGATIVE
Protein, ur: NEGATIVE mg/dL
Specific Gravity, Urine: 1.014 (ref 1.005–1.030)
pH: 6 (ref 5.0–8.0)

## 2020-03-08 LAB — TYPE AND SCREEN
ABO/RH(D): A POS
Antibody Screen: NEGATIVE

## 2020-03-08 LAB — GLUCOSE, CAPILLARY: Glucose-Capillary: 74 mg/dL (ref 70–99)

## 2020-03-08 LAB — PROTEIN / CREATININE RATIO, URINE
Creatinine, Urine: 81.67 mg/dL
Protein Creatinine Ratio: 0.24 mg/mg{Cre} — ABNORMAL HIGH (ref 0.00–0.15)
Total Protein, Urine: 20 mg/dL

## 2020-03-08 LAB — SARS CORONAVIRUS 2 BY RT PCR (HOSPITAL ORDER, PERFORMED IN ~~LOC~~ HOSPITAL LAB): SARS Coronavirus 2: NEGATIVE

## 2020-03-08 SURGERY — Surgical Case
Anesthesia: Spinal

## 2020-03-08 MED ORDER — LABETALOL HCL 5 MG/ML IV SOLN: 20.0000 mg | INTRAVENOUS | Status: DC | PRN

## 2020-03-08 MED ORDER — STERILE WATER FOR IRRIGATION IR SOLN: Status: DC | PRN

## 2020-03-08 MED ORDER — NALBUPHINE HCL 10 MG/ML IJ SOLN: 5.0000 mg | Freq: Once | INTRAMUSCULAR | Status: DC | PRN

## 2020-03-08 MED ORDER — PHENYLEPHRINE HCL-NACL 20-0.9 MG/250ML-% IV SOLN
INTRAVENOUS | Status: DC | PRN
  Administered 2020-03-08: 60 ug/min via INTRAVENOUS

## 2020-03-08 MED ORDER — FAMOTIDINE IN NACL 20-0.9 MG/50ML-% IV SOLN
INTRAVENOUS | Status: AC
  Administered 2020-03-08: 20 mg via INTRAVENOUS
  Filled 2020-03-08: qty 50

## 2020-03-08 MED ORDER — DIPHENHYDRAMINE HCL 50 MG/ML IJ SOLN: 12.5000 mg | INTRAMUSCULAR | Status: DC | PRN

## 2020-03-08 MED ORDER — MORPHINE SULFATE (PF) 0.5 MG/ML IJ SOLN
INTRAMUSCULAR | Status: AC
Start: 2020-03-08 — End: ?
  Filled 2020-03-08: qty 10

## 2020-03-08 MED ORDER — ACETAMINOPHEN 160 MG/5ML PO SOLN: 1000.0000 mg | Freq: Once | ORAL | Status: AC

## 2020-03-08 MED ORDER — BUPIVACAINE IN DEXTROSE 0.75-8.25 % IT SOLN
INTRATHECAL | Status: DC | PRN
  Administered 2020-03-08: 1.6 mL via INTRATHECAL

## 2020-03-08 MED ORDER — BUPIVACAINE HCL (PF) 0.25 % IJ SOLN
INTRAMUSCULAR | Status: AC
  Filled 2020-03-08: qty 20

## 2020-03-08 MED ORDER — FAMOTIDINE IN NACL 20-0.9 MG/50ML-% IV SOLN: 20.0000 mg | Freq: Once | INTRAVENOUS | Status: AC

## 2020-03-08 MED ORDER — NALBUPHINE HCL 10 MG/ML IJ SOLN: 5.0000 mg | INTRAMUSCULAR | Status: DC | PRN

## 2020-03-08 MED ORDER — LACTATED RINGERS IV SOLN: INTRAVENOUS | Status: DC | PRN

## 2020-03-08 MED ORDER — NALOXONE HCL 0.4 MG/ML IJ SOLN: 0.4000 mg | INTRAMUSCULAR | Status: DC | PRN

## 2020-03-08 MED ORDER — SOD CITRATE-CITRIC ACID 500-334 MG/5ML PO SOLN
ORAL | Status: AC
  Administered 2020-03-08: 30 mL via ORAL
  Filled 2020-03-08: qty 30

## 2020-03-08 MED ORDER — OXYTOCIN-SODIUM CHLORIDE 30-0.9 UT/500ML-% IV SOLN
INTRAVENOUS | Status: DC | PRN
  Administered 2020-03-08: 30 [IU] via INTRAVENOUS

## 2020-03-08 MED ORDER — NALOXONE HCL 4 MG/10ML IJ SOLN
1.0000 ug/kg/h | INTRAVENOUS | Status: DC | PRN
  Filled 2020-03-08: qty 5

## 2020-03-08 MED ORDER — ACETAMINOPHEN 500 MG PO TABS
1000.0000 mg | ORAL_TABLET | Freq: Once | ORAL | Status: AC
  Administered 2020-03-09: 1000 mg via ORAL

## 2020-03-08 MED ORDER — MORPHINE SULFATE (PF) 0.5 MG/ML IJ SOLN
INTRAMUSCULAR | Status: DC | PRN
Start: 2020-03-08 — End: 2020-03-08
  Administered 2020-03-08: .15 mg via INTRATHECAL

## 2020-03-08 MED ORDER — HYDRALAZINE HCL 20 MG/ML IJ SOLN: 10.0000 mg | INTRAMUSCULAR | Status: DC | PRN

## 2020-03-08 MED ORDER — BUPIVACAINE HCL (PF) 0.25 % IJ SOLN
INTRAMUSCULAR | Status: AC
  Filled 2020-03-08: qty 10

## 2020-03-08 MED ORDER — FENTANYL CITRATE (PF) 100 MCG/2ML IJ SOLN: 25.0000 ug | INTRAMUSCULAR | Status: DC | PRN

## 2020-03-08 MED ORDER — FENTANYL CITRATE (PF) 100 MCG/2ML IJ SOLN
INTRAMUSCULAR | Status: DC | PRN
Start: 2020-03-08 — End: 2020-03-08
  Administered 2020-03-08: 15 ug via INTRATHECAL

## 2020-03-08 MED ORDER — SODIUM CHLORIDE 0.9 % IR SOLN: Status: DC | PRN

## 2020-03-08 MED ORDER — ONDANSETRON HCL 4 MG/2ML IJ SOLN: 4.0000 mg | Freq: Three times a day (TID) | INTRAMUSCULAR | Status: DC | PRN

## 2020-03-08 MED ORDER — DIPHENHYDRAMINE HCL 25 MG PO CAPS: 25.0000 mg | ORAL_CAPSULE | ORAL | Status: DC | PRN

## 2020-03-08 MED ORDER — SOD CITRATE-CITRIC ACID 500-334 MG/5ML PO SOLN: 30.0000 mL | Freq: Once | ORAL | Status: AC

## 2020-03-08 MED ORDER — PROMETHAZINE HCL 25 MG/ML IJ SOLN: 6.2500 mg | INTRAMUSCULAR | Status: DC | PRN

## 2020-03-08 MED ORDER — DEXAMETHASONE SODIUM PHOSPHATE 10 MG/ML IJ SOLN
INTRAMUSCULAR | Status: AC
  Filled 2020-03-08: qty 1

## 2020-03-08 MED ORDER — ONDANSETRON HCL 4 MG/2ML IJ SOLN
INTRAMUSCULAR | Status: AC
  Filled 2020-03-08: qty 2

## 2020-03-08 MED ORDER — CEFAZOLIN SODIUM-DEXTROSE 2-4 GM/100ML-% IV SOLN
INTRAVENOUS | Status: AC
  Filled 2020-03-08: qty 100

## 2020-03-08 MED ORDER — CEFAZOLIN SODIUM-DEXTROSE 2-3 GM-%(50ML) IV SOLR
INTRAVENOUS | Status: DC | PRN
  Administered 2020-03-08: 2 g via INTRAVENOUS

## 2020-03-08 MED ORDER — SODIUM CHLORIDE 0.9% FLUSH: 3.0000 mL | INTRAVENOUS | Status: DC | PRN

## 2020-03-08 MED ORDER — LABETALOL HCL 5 MG/ML IV SOLN: 80.0000 mg | INTRAVENOUS | Status: DC | PRN

## 2020-03-08 MED ORDER — ACETAMINOPHEN 500 MG PO TABS: 1000.0000 mg | ORAL_TABLET | Freq: Four times a day (QID) | ORAL | Status: DC

## 2020-03-08 MED ORDER — DEXAMETHASONE SODIUM PHOSPHATE 4 MG/ML IJ SOLN
INTRAMUSCULAR | Status: DC | PRN
  Administered 2020-03-08: 10 mg via INTRAVENOUS

## 2020-03-08 MED ORDER — ONDANSETRON HCL 4 MG/2ML IJ SOLN
INTRAMUSCULAR | Status: DC | PRN
  Administered 2020-03-08: 4 mg via INTRAVENOUS

## 2020-03-08 MED ORDER — SODIUM CHLORIDE 0.9 % IV SOLN: INTRAVENOUS | Status: DC | PRN

## 2020-03-08 MED ORDER — LABETALOL HCL 5 MG/ML IV SOLN: 40.0000 mg | INTRAVENOUS | Status: DC | PRN

## 2020-03-08 MED ORDER — KETOROLAC TROMETHAMINE 30 MG/ML IJ SOLN
30.0000 mg | Freq: Once | INTRAMUSCULAR | Status: AC
  Administered 2020-03-09: 30 mg via INTRAVENOUS

## 2020-03-08 MED ORDER — FENTANYL CITRATE (PF) 100 MCG/2ML IJ SOLN
INTRAMUSCULAR | Status: AC
  Filled 2020-03-08: qty 2

## 2020-03-08 MED ORDER — OXYTOCIN-SODIUM CHLORIDE 30-0.9 UT/500ML-% IV SOLN
INTRAVENOUS | Status: AC
  Filled 2020-03-08: qty 500

## 2020-03-08 SURGICAL SUPPLY — 43 items
BENZOIN TINCTURE PRP APPL 2/3 (GAUZE/BANDAGES/DRESSINGS) ×2 IMPLANT
CHLORAPREP W/TINT 26ML (MISCELLANEOUS) ×2 IMPLANT
CLAMP CORD UMBIL (MISCELLANEOUS) IMPLANT
CLOTH BEACON ORANGE TIMEOUT ST (SAFETY) ×2 IMPLANT
DERMABOND ADVANCED (GAUZE/BANDAGES/DRESSINGS) ×2
DERMABOND ADVANCED .7 DNX12 (GAUZE/BANDAGES/DRESSINGS) ×2 IMPLANT
DRSG OPSITE POSTOP 4X10 (GAUZE/BANDAGES/DRESSINGS) ×2 IMPLANT
DRSG PAD ABDOMINAL 8X10 ST (GAUZE/BANDAGES/DRESSINGS) ×4 IMPLANT
ELECT REM PT RETURN 9FT ADLT (ELECTROSURGICAL) ×2
ELECTRODE REM PT RTRN 9FT ADLT (ELECTROSURGICAL) ×1 IMPLANT
EXTRACTOR VACUUM BELL STYLE (SUCTIONS) IMPLANT
GLOVE BIOGEL PI IND STRL 7.0 (GLOVE) ×1 IMPLANT
GLOVE BIOGEL PI IND STRL 8 (GLOVE) ×1 IMPLANT
GLOVE BIOGEL PI INDICATOR 7.0 (GLOVE) ×1
GLOVE BIOGEL PI INDICATOR 8 (GLOVE) ×1
GLOVE ECLIPSE 8.0 STRL XLNG CF (GLOVE) ×2 IMPLANT
GOWN STRL REUS W/TWL LRG LVL3 (GOWN DISPOSABLE) ×4 IMPLANT
HEMOSTAT ARISTA ABSORB 3G PWDR (HEMOSTASIS) ×2 IMPLANT
KIT ABG SYR 3ML LUER SLIP (SYRINGE) ×2 IMPLANT
NEEDLE HYPO 18GX1.5 BLUNT FILL (NEEDLE) ×2 IMPLANT
NEEDLE HYPO 22GX1.5 SAFETY (NEEDLE) ×2 IMPLANT
NEEDLE HYPO 25X5/8 SAFETYGLIDE (NEEDLE) ×2 IMPLANT
NS IRRIG 1000ML POUR BTL (IV SOLUTION) ×2 IMPLANT
PACK C SECTION WH (CUSTOM PROCEDURE TRAY) ×2 IMPLANT
PAD OB MATERNITY 4.3X12.25 (PERSONAL CARE ITEMS) ×2 IMPLANT
PENCIL SMOKE EVAC W/HOLSTER (ELECTROSURGICAL) ×2 IMPLANT
RTRCTR C-SECT PINK 25CM LRG (MISCELLANEOUS) IMPLANT
SPONGE GAUZE 4X4 12PLY STER LF (GAUZE/BANDAGES/DRESSINGS) ×4 IMPLANT
SPONGE LAP 18X18 RF (DISPOSABLE) ×2 IMPLANT
STRIP CLOSURE SKIN 1/2X4 (GAUZE/BANDAGES/DRESSINGS) ×2 IMPLANT
SUT CHROMIC 0 CT 1 (SUTURE) ×2 IMPLANT
SUT MNCRL 0 VIOLET CTX 36 (SUTURE) ×2 IMPLANT
SUT MONOCRYL 0 CTX 36 (SUTURE) ×2
SUT PLAIN 2 0 (SUTURE)
SUT PLAIN 2 0 XLH (SUTURE) IMPLANT
SUT PLAIN ABS 2-0 CT1 27XMFL (SUTURE) IMPLANT
SUT VIC AB 0 CTX 36 (SUTURE) ×1
SUT VIC AB 0 CTX36XBRD ANBCTRL (SUTURE) ×1 IMPLANT
SUT VIC AB 4-0 KS 27 (SUTURE) IMPLANT
SYR 20CC LL (SYRINGE) ×4 IMPLANT
TOWEL OR 17X24 6PK STRL BLUE (TOWEL DISPOSABLE) ×2 IMPLANT
TRAY FOLEY W/BAG SLVR 14FR LF (SET/KITS/TRAYS/PACK) IMPLANT
WATER STERILE IRR 1000ML POUR (IV SOLUTION) ×2 IMPLANT

## 2020-03-08 NOTE — MAU Note (Signed)
Presents with c/o elevated BP's over the weekend.  Sent BP's to MD office, instructed to go to MAU for further eval.  Denies H/A, visual disturbances, or epigastric pain.  Endorses +FM.  Denies VB or LOF.

## 2020-03-08 NOTE — H&P (Signed)
Obstetric Preoperative History and Physical  Vanessa Rice is a 34 y.o. [phone] with IUP at 54w4dpresenting from MAU for repeat cesarean section in the setting of newly diagnosed gestational HTN.  Reports good fetal movement, no bleeding, no contractions, no leaking of fluid. Denies headaches, vision changes, chest pain. No acute preoperative concerns.   Cesarean Section Indication: repeat LTCS, newly diagnosed gHTN   UK[place]36 wks,cephalic,anterior placenta gr 2,afi 17.9 cm,fhr 169 bpm,EFW 2839 g 53%   Prenatal Course [facility]   Pregnancy complications or risks: Patient Active Problem List   Diagnosis Date Noted  . Cesarean delivery delivered 03/08/2020  . Gestational diabetes 01/12/2020  . Supervision of high-risk pregnancy 09/18/2019  . S/P primary low transverse C-section 09/12/2015  . Hx of preeclampsia, prior pregnancy, currently pregnant 09/09/2015   She plans to breastfeed She desires oral progesterone-only contraceptive for postpartum contraception.   Prenatal labs and studies: ABO, Rh: A/Positive/-- (03/19 1105) Antibody: Negative (07/08 0842) Rubella: 2.58 (03/19 1105) RPR: Non Reactive (07/08 0842)  HBsAg: Negative (03/19 1105)  HIV: Non Reactive (07/08 0842)  GQPR:FFMBWGYK/- (08/27 1427) 2 hr Glucola  abnormal Genetic screening normal Anatomy UK[place]ormal  Prenatal Transfer Tool  Maternal Diabetes: Yes:  Diabetes Type:  Diet controlled Genetic Screening: Normal Maternal Ultrasounds/Referrals: Normal Fetal Ultrasounds or other Referrals:  None Maternal Substance Abuse:  No Significant Maternal Medications:  None Significant Maternal Lab Results: Group B Strep positive  Past Medical History:  Diagnosis Date  . Acute blood loss anemia 09/13/2015  . Bronchial spasms   . Gestational diabetes 01/12/2020  . Mild preeclampsia 09/09/2015    Past Surgical History:  Procedure Laterality Date  . CESAREAN SECTION N/A 09/12/2015   Procedure:  CESAREAN SECTION;  Surgeon: LFlorian Buff MD;  [facility];  Service: Obstetrics;  Laterality: N/A;  . WISDOM TOOTH EXTRACTION  04/2011    OB History  Gravida Para Term Preterm AB Living  _0 0 2 1  SAB TAB Ectopic Multiple Live Births  2 0 0 0 1    # Outcome Date GA Lbr Len/2nd Weight Sex Delivery Anes PTL Lv  4 Current           3 SAB 01/2018          2 Term 09/12/15 [id number] g M CS-LTranv EPI  LIV  1 SAB 08/2014 8w19w0d        Social History   Socioeconomic History  . Marital status: Married    Spouse name: Not on file  . Number of children: Not on file  . Years of education: Not on file  . Highest education level: Not on file  Occupational History  . Not on file  Tobacco Use  . Smoking status: Never Smoker  . Smokeless tobacco: Never Used  Vaping Use  . Vaping Use: Never used  Substance and Sexual Activity  . Alcohol use: No  . Drug use: No  . Sexual activity: Yes    Partners: Male    Birth control/protection: None  Other Topics Concern  . Not on file  Social History Narrative  . Not on file   Social Determinants of Health   Financial Resource Strain:   . Difficulty of Paying Living Expenses: Not on file  Food Insecurity: No Food Insecurity  . Worried About RunCharity fundraiser the Last Year: Never true  . Ran Out of Food in the Last Year: Never  true  Transportation Needs: No Transportation Needs  . Lack of Transportation (Medical): No  . Lack of Transportation (Non-Medical): No  Physical Activity:   . Days of Exercise per Week: Not on file  . Minutes of Exercise per Session: Not on file  Stress:   . Feeling of Stress : Not on file  Social Connections:   . Frequency of Communication with Friends and Family: Not on file  . Frequency of Social Gatherings with Friends and Family: Not on file  . Attends Religious Services: Not on file  . Active Member of Clubs or Organizations: Not on file  . Attends Archivist Meetings: Not on  file  . Marital Status: Not on file    Family History  Problem Relation Age of Onset  . Hypertension Mother   . Hyperlipidemia Mother   . Polycystic ovary syndrome Sister   . Diabetes Maternal Grandmother   . Thyroid disease Maternal Grandmother   . Diabetes Maternal Grandfather   . Breast cancer Paternal Grandmother   . Breast cancer Maternal Aunt 52  . Cancer Maternal Aunt 51       breast  . Other Maternal Aunt        Platelet grandular defect  . Cancer Paternal Aunt        breast cancer    Medications Prior to Admission  Medication Sig Dispense Refill Last Dose  . Accu-Chek Softclix Lancets lancets Use as instructed to check blood sugar 4 times daily 100 each 12   . aspirin EC 81 MG tablet Take 2 tablets (162 mg total) by mouth daily. 60 tablet 11   . Blood Glucose Monitoring Suppl (ACCU-CHEK GUIDE ME) w/Device KIT 1 each by Does not apply route 4 (four) times daily. 1 kit 0   . Blood Pressure Monitor MISC For regular home bp monitoring during pregnancy 1 each 0   . glucose blood (ACCU-CHEK GUIDE) test strip Use as instructed to check blood sugar 4 times daily 50 each 12   . Prenatal Vit-Fe Fumarate-FA (MULTIVITAMIN-PRENATAL) 27-0.8 MG TABS tablet Take 1 tablet by mouth daily at 12 noon.       No Known Allergies  Review of Systems: Pertinent items noted in HPI and remainder of comprehensive ROS otherwise negative.  Physical Exam: BP (!) 143/96   Pulse 79   Temp 98.4 F (36.9 C) (Oral)   Resp 20   Ht _0  (1.6 m)   Wt 111.4 kg   LMP 06/19/2019 (Exact Date)   SpO2 99%   BMI 43.51 kg/m  FHR by Doppler: Baseline 140, mod variability, pos accels, neg decels  CONSTITUTIONAL: Well-developed, well-nourished female in no acute distress.  HENT:  Normocephalic, atraumatic, External right and left ear normal. Oropharynx is clear and moist EYES: Conjunctivae and EOM are normal. No scleral icterus.  NECK: Normal range of motion, supple, no masses SKIN: Skin is warm and dry.  No rash noted. Not diaphoretic. No erythema. No pallor. [place]: Alert and oriented to person, place, and time. Normal reflexes, muscle tone coordination. No cranial nerve deficit noted. PSYCHIATRIC: Normal mood and affect. Normal behavior. Normal judgment and thought content. CARDIOVASCULAR: Normal heart rate noted RESPIRATORY: Effort and breath sounds normal, no problems with respiration noted ABDOMEN: Soft, nontender, nondistended, gravid. Well-healed Pfannenstiel incision. PELVIC: Deferred MUSCULOSKELETAL: Normal range of motion. No edema and no tenderness. 2+ distal pulses.   Pertinent Labs/Studies:   Results for orders placed or performed during the hospital encounter of 03/08/20 (from the past  72 hour(s))  Urinalysis, Routine w reflex microscopic Urine, Clean Catch     Status: Abnormal   Collection Time: 03/08/20  6:16 PM  Result Value Ref Range   Color, Urine YELLOW YELLOW   APPearance HAZY (A) CLEAR   Specific Gravity, Urine 1.014 1.005 - 1.030   pH 6.0 5.0 - 8.0   Glucose, UA NEGATIVE NEGATIVE mg/dL   Hgb urine dipstick NEGATIVE NEGATIVE   Bilirubin Urine NEGATIVE NEGATIVE   Ketones, ur NEGATIVE NEGATIVE mg/dL   Protein, ur NEGATIVE NEGATIVE mg/dL   Nitrite NEGATIVE NEGATIVE   Leukocytes,Ua SMALL (A) NEGATIVE   RBC / HPF 0-5 0 - 5 RBC/hpf   WBC, UA 0-5 0 - 5 WBC/hpf   Bacteria, UA FEW (A) NONE SEEN   Squamous Epithelial / LPF 0-5 0 - 5   Mucus PRESENT     Comment: Performed at [facility], [place] [address], [place], [place] [address]  Comprehensive metabolic panel     Status: Abnormal   Collection Time: 03/08/20  7:00 PM  Result Value Ref Range   Sodium 137 135 - 145 mmol/L   Potassium 3.7 3.5 - 5.1 mmol/L   Chloride 108 98 - 111 mmol/L   CO2 18 (L) 22 - 32 mmol/L   Glucose, Bld 74 70 - 99 mg/dL    Comment: Glucose reference range applies only to samples taken after fasting for at least 8 hours.   BUN 11 6 - 20 mg/dL   Creatinine, Ser 0.70 0.44 - 1.00 mg/dL    Calcium 8.7 (L) 8.9 - 10.3 mg/dL   Total Protein 5.8 (L) 6.5 - 8.1 g/dL   Albumin 2.6 (L) 3.5 - 5.0 g/dL   AST 19 15 - 41 U/L   ALT 15 0 - 44 U/L   Alkaline Phosphatase 93 38 - 126 U/L   Total Bilirubin 0.3 0.3 - 1.2 mg/dL   GFR calc non Af Amer >60 >60 mL/min   GFR calc Af Amer >60 >60 mL/min   Anion gap 11 5 - 15    Comment: Performed at [facility] [address] [place], [place] [address]  CBC     Status: Abnormal   Collection Time: 03/08/20  7:00 PM  Result Value Ref Range   WBC 10.6 (H) 4.0 - 10.5 K/uL   RBC 3.88 3.87 - 5.11 MIL/uL   Hemoglobin 12.1 12.0 - 15.0 g/dL   HCT 36.4 36 - 46 %   MCV 93.8 80.0 - 100.0 fL   MCH 31.2 26.0 - 34.0 pg   MCHC 33.2 30.0 - 36.0 g/dL   RDW 13.5 11.5 - 15.5 %   Platelets 216 150 - 400 K/uL   nRBC 0.0 0.0 - 0.2 %    Comment: Performed at [facility], [place] [address], [place], [place] [address]    Assessment and Plan: Vanessa Rice is a 34 y.o. [phone] at 62w4dbeing admitted for scheduled cesarean section. The risks of cesarean section discussed with the patient included but were not limited to: bleeding which may require transfusion or reoperation; infection which may require antibiotics; injury to bowel, bladder, ureters or other surrounding organs; injury to the fetus; need for additional procedures including hysterectomy in the event of a life-threatening hemorrhage; placental abnormalities with subsequent pregnancies, incisional problems, thromboembolic phenomenon and other postoperative/anesthesia complications. The patient concurred with the proposed plan, giving informed written consent for the procedure. Patient has been NPO since last night she will  remain NPO for procedure. Anesthesia and OR aware. Preoperative prophylactic antibiotics and SCDs ordered on call to the OR. To OR when ready.   Pregnancy Complications:   -O6VEH: diet controlled, fasting cbg in AM   -gHTN: has been normotensive throughout pregnancy,  multiple elevated pressures upon arrival to MAU. Has been on asa 14m.   Contraception: desires POP's Circumcision: n/a   JSharene Skeans MD [facility]son[facility] Medicine Fellow, [facility]

## 2020-03-08 NOTE — MAU Provider Note (Signed)
Chief Complaint  Patient presents with  . BP Evaluation     First Provider Initiated Contact with Patient 03/08/20 1853      S: Vanessa Rice  is a 34 y.o. y.o. year old [id number] female at [redacted]w[redacted]d weeks gestation who presents to MAU with elevated blood pressures. Hx of preeclampsia in her last pregnancy. She is planning to have a repeat c/section. Has had not issues with her blood pressure this pregnancy until the last few days. At home her blood pressures have been 130s-150s/90s-100s. Denies headache, visual disturbance, or epigastric pain. Good fetal movement.  Last ate at 1 pm.  O:  Patient Vitals for the past 24 hrs:  BP Temp Temp src Pulse Resp SpO2 Height Weight  03/08/20 1930 (!) 146/89 -- -- 80 -- -- -- --  03/08/20 1915 (!) 149/85 -- -- 77 -- -- -- --  03/08/20 1900 (!) 152/83 -- -- 81 -- -- -- --  03/08/20 1845 (!) 155/86 -- -- 68 -- -- -- --  03/08/20 1820 (!) 167/95 98.4 F (36.9 C) Oral 80 20 99 % -- --  03/08/20 1817 -- -- -- -- -- -- 5\' 3"  (1.6 m) 111.4 kg   General: NAD Heart: Regular rate Lungs: Normal rate and effort Abd: Soft, NT, Gravid, S=D Extremities: non pitting Pedal edema Neuro: 2+ deep tendon reflexes, No clonus  Fetal Tracing:  Baseline: 145 Variability: moderate Accelerations: 15x15 Decelerations: small variable x 1  Toco: Q3-5 minutes   Results for orders placed or performed during the hospital encounter of 03/08/20 (from the past 24 hour(s))  Urinalysis, Routine w reflex microscopic Urine, Clean Catch     Status: Abnormal   Collection Time: 03/08/20  6:16 PM  Result Value Ref Range   Color, Urine YELLOW YELLOW   APPearance HAZY (A) CLEAR   Specific Gravity, Urine 1.014 1.005 - 1.030   pH 6.0 5.0 - 8.0   Glucose, UA NEGATIVE NEGATIVE mg/dL   Hgb urine dipstick NEGATIVE NEGATIVE   Bilirubin Urine NEGATIVE NEGATIVE   Ketones, ur NEGATIVE NEGATIVE mg/dL   Protein, ur NEGATIVE NEGATIVE mg/dL   Nitrite NEGATIVE NEGATIVE   Leukocytes,Ua  SMALL (A) NEGATIVE   RBC / HPF 0-5 0 - 5 RBC/hpf   WBC, UA 0-5 0 - 5 WBC/hpf   Bacteria, UA FEW (A) NONE SEEN   Squamous Epithelial / LPF 0-5 0 - 5   Mucus PRESENT   CBC     Status: Abnormal   Collection Time: 03/08/20  7:00 PM  Result Value Ref Range   WBC 10.6 (H) 4.0 - 10.5 K/uL   RBC 3.88 3.87 - 5.11 MIL/uL   Hemoglobin 12.1 12.0 - 15.0 g/dL   HCT 05/08/20 36 - 46 %   MCV 93.8 80.0 - 100.0 fL   MCH 31.2 26.0 - 34.0 pg   MCHC 33.2 30.0 - 36.0 g/dL   RDW 62.2 29.7 - 98.9 %   Platelets 216 150 - 400 K/uL   nRBC 0.0 0.0 - 0.2 %    MDM Initial BP is severe, repeat BP remain elevated but not severe range. Pt is asymptomatic. Preeclampsia labs are pending. Discussed patient with Dr. 21.1. Will plan to keep for RCS. Pt is NPO since 1 pm.    A:  1. Gestational hypertension, third trimester   2. [redacted] weeks gestation of pregnancy   3. Previous cesarean section     P:  Plan to delivery for new onset elevated BPs at term COVID  swab & PEC labs pending  Judeth Horn, NP 03/08/2020 7:46 PM

## 2020-03-08 NOTE — Transfer of Care (Signed)
Immediate Anesthesia Transfer of Care Note  Patient: Vanessa Rice  Procedure(s) Performed: CESAREAN SECTION (N/A )  Patient Location: PACU  Anesthesia Type:Spinal  Level of Consciousness: awake, alert  and oriented  Airway & Oxygen Therapy: Patient Spontanous Breathing  Post-op Assessment: Report given to RN and Post -op Vital signs reviewed and stable  Post vital signs: Reviewed and stable  Last Vitals:  Vitals Value Taken Time  BP    Temp    Pulse 73 03/08/20 2346  Resp 21 03/08/20 2346  SpO2 97 % 03/08/20 2346  Vitals shown include unvalidated device data.  Last Pain:  Vitals:   03/08/20 1820  TempSrc: Oral         Complications: No complications documented.

## 2020-03-08 NOTE — Op Note (Signed)
Preoperative Diagnosis:  IUP @ [redacted]w[redacted]d, gestational hypertension, hx of prior CS   Postoperative Diagnosis:  Same  Procedure: repeat low transverse cesarean section  Surgeon: Tinnie Gens, M.D.  Assistant: Casper Harrison, MD   Findings: Viable female infant, APGAR (1 MIN):  pending APGAR (5 MINS):  Pending  Weight pending cephalic presentation   Estimated blood loss: 467 cc  Complications: None known  Specimens: Placenta to labor and delivery  Reason for procedure: Briefly, the patient is a 34 y.o. [id number] [redacted]w[redacted]d who presented to MAU earlier today with elevated pressures, meeting diagnosis criteria for gHTN. Patient has had a prior LTCS and elected for repeat LTCS.   Procedure: Patient is a to the OR where spinal analgesia was administered. She was then placed in a supine position with left lateral tilt. She received 2 g of Ancef and SCDs were in place. A timeout was performed. She was prepped and draped in the usual sterile fashion. A Foley catheter was placed in the bladder. A knife was then used to make a Pfannenstiel incision. This incision was carried out to underlying fascia which was divided in the midline with the knife. The incision was extended laterally, bluntly.  The rectus was bluntly divided in the midline.  The peritoneal cavity was entered bluntly.  Alexis retractor was placed inside the incision.  A knife was used to make a low transverse incision on the uterus. This incision was bluntly carried down to the amniotic cavity and was entered. Fetus was in cephalic position and was brought up out of the incision without difficulty. Cord was clamped x 2 and cut. Infant taken to waiting nurse.  Cord blood was obtained. Placenta was delivered from the uterus.  Uterus was cleaned with dry lap pads. Uterine incision closed with 0 Monocryl suture in a locked running fashion. A second layer of 0 Monocryl in an imbricating fashion was used to achieve hemostasis. Alexis retractor was removed  from the abdomen. Peritoneal closure was done with 0 Monocryl suture.  Fascia is closed with 0 Vicryl suture in a running fashion. Subcutaneous tissue infused with 30cc 0.25% Marcaine.  Subcutaneous closure was performed with 0 plain suture.  Skin closed using 3-0 Vicryl on a Keith needle.  Steri strips applied, followed by pressure dressing.  All instrument, needle and lap counts were correct x 2.  Patient was awake and taken to PACU stable.  Infant remained with mom in couplet care, stable.   Gita Kudo MD 03/08/2020 11:40 PM

## 2020-03-08 NOTE — Anesthesia Preprocedure Evaluation (Addendum)
Anesthesia Evaluation  Patient identified by MRN, date of birth, ID band Patient awake    Reviewed: Allergy & Precautions, NPO status , Patient's Chart, lab work & pertinent test results  History of Anesthesia Complications Negative for: history of anesthetic complications  Airway Mallampati: II  TM Distance: >3 FB Neck ROM: Full    Dental no notable dental hx.    Pulmonary neg pulmonary ROS,    Pulmonary exam normal        Cardiovascular hypertension, Normal cardiovascular exam     Neuro/Psych negative neurological ROS  negative psych ROS   GI/Hepatic negative GI ROS, Neg liver ROS,   Endo/Other  diabetes, GestationalMorbid obesity  Renal/GU negative Renal ROS  negative genitourinary   Musculoskeletal negative musculoskeletal ROS (+)   Abdominal   Peds  Hematology negative hematology ROS (+)   Anesthesia Other Findings Day of surgery medications reviewed with patient.  Reproductive/Obstetrics (+) Pregnancy (Hx of C/S x1, preE)                            Anesthesia Physical Anesthesia Plan  ASA: III and emergent  Anesthesia Plan: Spinal   Post-op Pain Management:    Induction:   PONV Risk Score and Plan: 4 or greater and Treatment may vary due to age or medical condition, Ondansetron and Dexamethasone  Airway Management Planned: Natural Airway  Additional Equipment:   Intra-op Plan:   Post-operative Plan:   Informed Consent: I have reviewed the patients History and Physical, chart, labs and discussed the procedure including the risks, benefits and alternatives for the proposed anesthesia with the patient or authorized representative who has indicated his/her understanding and acceptance.       Plan Discussed with: CRNA  Anesthesia Plan Comments:        Anesthesia Quick Evaluation

## 2020-03-08 NOTE — Discharge Summary (Addendum)
Postpartum Discharge Summary       Patient Name: Vanessa Rice DOB: 14-Sep-1985 MRN: [id number]  Date of admission: 03/08/2020 Delivery date:03/08/2020  Delivering provider: Donnamae Jude  Date of discharge: 03/10/2020  Admitting diagnosis: Cesarean delivery delivered [O82] Intrauterine pregnancy: [redacted]w[redacted]d    Secondary diagnosis:  Active Problems:   Gestational diabetes   Cesarean delivery delivered   Gestational hypertension  Additional problems: none    Discharge diagnosis: Term Pregnancy Delivered                                              Post partum procedures:none Augmentation: N/A Complications: None  Hospital course: Sceduled C/S   34y.o. yo [id number] admitted to the hospital 03/08/2020 for scheduled cesarean section with the following indication:Elective Repeat.Delivery details are as follows:  Membrane Rupture Time/Date: 10:44 PM ,03/08/2020   Delivery Method:C-Section, Low Transverse  Details of operation can be found in separate operative note.  Patient had an uncomplicated postpartum course.  She is ambulating, tolerating a regular diet, passing flatus, and urinating well. Patient is discharged home in stable condition on  03/10/20        Newborn Data: Birth date:03/08/2020  Birth time:10:44 PM  Gender:Female  Living status:Living  Apgars:8 ,9  Weight:2830 g     Magnesium Sulfate received: No BMZ received: No Rhophylac:N/A MMR:N/A T-DaP:Given prenatally Flu: N/A Transfusion:No  Physical exam  Vitals:   03/09/20 1521 03/09/20 1935 03/09/20 2204 03/10/20 0510  BP: 129/89 125/82  (!) 144/93  Pulse:  64  76  Resp: '18 16  16  ' Temp: 98.2 F (36.8 C) 98.3 F (36.8 C)  98.3 F (36.8 C)  TempSrc: Oral Oral  Oral  SpO2: 97% 98% 98% 98%  Weight:      Height:       General: alert, cooperative and no distress Lochia: appropriate Uterine Fundus: firm Incision: Healing well with no significant drainage, Dressing is clean, dry, and intact DVT  Evaluation: No evidence of DVT seen on physical exam. Labs: Lab Results  Component Value Date   WBC 19.1 (H) 03/09/2020   HGB 11.3 (L) 03/09/2020   HCT 34.4 (L) 03/09/2020   MCV 92.5 03/09/2020   PLT 218 03/09/2020   CMP Latest Ref Rng & Units 03/09/2020  Glucose 70 - 99 mg/dL -  BUN 6 - 20 mg/dL -  Creatinine 0.44 - 1.00 mg/dL 0.59  Sodium 135 - 145 mmol/L -  Potassium 3.5 - 5.1 mmol/L -  Chloride 98 - 111 mmol/L -  CO2 22 - 32 mmol/L -  Calcium 8.9 - 10.3 mg/dL -  Total Protein 6.5 - 8.1 g/dL -  Total Bilirubin 0.3 - 1.2 mg/dL -  Alkaline Phos 38 - 126 U/L -  AST 15 - 41 U/L -  ALT 0 - 44 U/L -   Edinburgh Score: Edinburgh Postnatal Depression Scale Screening Tool 03/09/2020  I have been able to laugh and see the funny side of things. 0  I have looked forward with enjoyment to things. 0  I have blamed myself unnecessarily when things went wrong. 1  I have been anxious or worried for no good reason. 1  I have felt scared or panicky for no good reason. 0  Things have been getting on top of me. 0  I have been so unhappy  that I have had difficulty sleeping. 0  I have felt sad or miserable. 0  I have been so unhappy that I have been crying. 0  The thought of harming myself has occurred to me. 0  Edinburgh Postnatal Depression Scale Total 2     After visit meds:  Allergies as of 03/10/2020   No Known Allergies     Medication List    TAKE these medications   Accu-Chek Guide Me w/Device Kit 1 each by Does not apply route 4 (four) times daily.   Accu-Chek Guide test strip Generic drug: glucose blood Use as instructed to check blood sugar 4 times daily   Accu-Chek Softclix Lancets lancets Use as instructed to check blood sugar 4 times daily   aspirin EC 81 MG tablet Take 2 tablets (162 mg total) by mouth daily.   Blood Pressure Monitor Misc For regular home bp monitoring during pregnancy   diphenhydrAMINE 25 MG tablet Commonly known as: BENADRYL Take 25 mg by  mouth every 6 (six) hours as needed for allergies.   ibuprofen 800 MG tablet Commonly known as: ADVIL Take 1 tablet (800 mg total) by mouth every 6 (six) hours.   multivitamin-prenatal 27-0.8 MG Tabs tablet Take 1 tablet by mouth daily at 12 noon.   NIFEdipine 30 MG 24 hr tablet Commonly known as: ADALAT CC Take 1 tablet (30 mg total) by mouth daily.   norethindrone 0.35 MG tablet Commonly known as: MICRONOR Take 1 tablet (0.35 mg total) by mouth daily.   polyvinyl alcohol 1.4 % ophthalmic solution Commonly known as: LIQUIFILM TEARS Place 1 drop into both eyes as needed for dry eyes.        Discharge home in stable condition Infant Feeding: Breast Infant Disposition:home with mother Discharge instruction: per After Visit Summary and Postpartum booklet. Activity: Advance as tolerated. Pelvic rest for 6 weeks.  Diet: routine diet Future Appointments: No future appointments. Follow up Visit:  Follow-up Information    [facility] Follow up.   Contact information: [address] [place] [address] [phone]               Please schedule this patient for a In person postpartum visit in 6 weeks with the following provider: Any provider Additional Postpartum F/U:2 hour GTT in six weeks, Incision check and BP check in one week  High risk pregnancy complicated by: GDM and HTN Delivery mode:  C-Section, Low Transverse  Anticipated Birth Control:  POPs   03/10/2020 Hansel Feinstein, CNM

## 2020-03-09 ENCOUNTER — Encounter (HOSPITAL_COMMUNITY): Payer: Self-pay | Admitting: Family Medicine

## 2020-03-09 LAB — CBC
HCT: 34.4 % — ABNORMAL LOW (ref 36.0–46.0)
Hemoglobin: 11.3 g/dL — ABNORMAL LOW (ref 12.0–15.0)
MCH: 30.4 pg (ref 26.0–34.0)
MCHC: 32.8 g/dL (ref 30.0–36.0)
MCV: 92.5 fL (ref 80.0–100.0)
Platelets: 218 10*3/uL (ref 150–400)
RBC: 3.72 MIL/uL — ABNORMAL LOW (ref 3.87–5.11)
RDW: 13.3 % (ref 11.5–15.5)
WBC: 19.1 10*3/uL — ABNORMAL HIGH (ref 4.0–10.5)
nRBC: 0 % (ref 0.0–0.2)

## 2020-03-09 LAB — CREATININE, SERUM
Creatinine, Ser: 0.59 mg/dL (ref 0.44–1.00)
GFR calc Af Amer: >60 mL/min (ref >60)
GFR calc non Af Amer: >60 mL/min (ref >60)

## 2020-03-09 LAB — RPR: RPR Ser Ql: NONREACTIVE

## 2020-03-09 LAB — GLUCOSE, CAPILLARY: Glucose-Capillary: 124 mg/dL — ABNORMAL HIGH (ref 70–99)

## 2020-03-09 MED ORDER — ENOXAPARIN SODIUM 60 MG/0.6ML ~~LOC~~ SOLN
0.5000 mg/kg | SUBCUTANEOUS | Status: DC
  Administered 2020-03-09: 55 mg via SUBCUTANEOUS
  Filled 2020-03-09: qty 0.6

## 2020-03-09 MED ORDER — ACETAMINOPHEN 500 MG PO TABS
ORAL_TABLET | ORAL | Status: AC
  Filled 2020-03-09: qty 2

## 2020-03-09 MED ORDER — NIFEDIPINE ER OSMOTIC RELEASE 30 MG PO TB24
30.0000 mg | ORAL_TABLET | Freq: Every day | ORAL | Status: DC
  Administered 2020-03-09 – 2020-03-10 (×2): 30 mg via ORAL
  Filled 2020-03-09 (×2): qty 1

## 2020-03-09 MED ORDER — SIMETHICONE 80 MG PO CHEW
80.0000 mg | CHEWABLE_TABLET | ORAL | Status: DC
  Administered 2020-03-09 (×2): 80 mg via ORAL
  Filled 2020-03-09: qty 1

## 2020-03-09 MED ORDER — OXYTOCIN-SODIUM CHLORIDE 30-0.9 UT/500ML-% IV SOLN: 2.5000 [IU]/h | INTRAVENOUS | Status: AC

## 2020-03-09 MED ORDER — ACETAMINOPHEN 500 MG PO TABS
1000.0000 mg | ORAL_TABLET | Freq: Three times a day (TID) | ORAL | Status: DC
  Administered 2020-03-09 – 2020-03-10 (×4): 1000 mg via ORAL
  Filled 2020-03-09 (×4): qty 2

## 2020-03-09 MED ORDER — OXYCODONE HCL 5 MG PO TABS: 5.0000 mg | ORAL_TABLET | ORAL | Status: DC | PRN

## 2020-03-09 MED ORDER — SIMETHICONE 80 MG PO CHEW
80.0000 mg | CHEWABLE_TABLET | Freq: Three times a day (TID) | ORAL | Status: DC
  Administered 2020-03-09 – 2020-03-10 (×3): 80 mg via ORAL
  Filled 2020-03-09 (×4): qty 1

## 2020-03-09 MED ORDER — KETOROLAC TROMETHAMINE 30 MG/ML IJ SOLN
INTRAMUSCULAR | Status: AC
  Filled 2020-03-09: qty 1

## 2020-03-09 MED ORDER — MENTHOL 3 MG MT LOZG: 1.0000 | LOZENGE | OROMUCOSAL | Status: DC | PRN

## 2020-03-09 MED ORDER — PRENATAL MULTIVITAMIN CH
1.0000 | ORAL_TABLET | Freq: Every day | ORAL | Status: DC
  Administered 2020-03-09 – 2020-03-10 (×2): 1 via ORAL
  Filled 2020-03-09 (×2): qty 1

## 2020-03-09 MED ORDER — KETOROLAC TROMETHAMINE 30 MG/ML IJ SOLN
30.0000 mg | Freq: Four times a day (QID) | INTRAMUSCULAR | Status: AC
  Administered 2020-03-09 (×3): 30 mg via INTRAVENOUS
  Filled 2020-03-09 (×3): qty 1

## 2020-03-09 MED ORDER — SIMETHICONE 80 MG PO CHEW
80.0000 mg | CHEWABLE_TABLET | ORAL | Status: DC | PRN
  Administered 2020-03-09: 80 mg via ORAL

## 2020-03-09 MED ORDER — SENNOSIDES-DOCUSATE SODIUM 8.6-50 MG PO TABS
2.0000 | ORAL_TABLET | ORAL | Status: DC
  Administered 2020-03-09 (×2): 2 via ORAL
  Filled 2020-03-09: qty 2

## 2020-03-09 MED ORDER — COCONUT OIL OIL: 1.0000 "application " | TOPICAL_OIL | Status: DC | PRN

## 2020-03-09 MED ORDER — DIBUCAINE (PERIANAL) 1 % EX OINT: 1.0000 "application " | TOPICAL_OINTMENT | CUTANEOUS | Status: DC | PRN

## 2020-03-09 MED ORDER — LACTATED RINGERS IV SOLN: INTRAVENOUS | Status: DC

## 2020-03-09 MED ORDER — WITCH HAZEL-GLYCERIN EX PADS: 1.0000 "application " | MEDICATED_PAD | CUTANEOUS | Status: DC | PRN

## 2020-03-09 MED ORDER — IBUPROFEN 800 MG PO TABS
800.0000 mg | ORAL_TABLET | Freq: Four times a day (QID) | ORAL | Status: DC
  Administered 2020-03-09 – 2020-03-10 (×3): 800 mg via ORAL
  Filled 2020-03-09 (×3): qty 1

## 2020-03-09 MED ORDER — DIPHENHYDRAMINE HCL 25 MG PO CAPS: 25.0000 mg | ORAL_CAPSULE | Freq: Four times a day (QID) | ORAL | Status: DC | PRN

## 2020-03-09 NOTE — Anesthesia Postprocedure Evaluation (Signed)
Anesthesia Post Note  Patient: Vanessa Rice  Procedure(s) Performed: CESAREAN SECTION (N/A )     Patient location during evaluation: PACU Anesthesia Type: Spinal Level of consciousness: awake and alert and oriented Pain management: pain level controlled Vital Signs Assessment: post-procedure vital signs reviewed and stable Respiratory status: spontaneous breathing, nonlabored ventilation and respiratory function stable Cardiovascular status: blood pressure returned to baseline Postop Assessment: no apparent nausea or vomiting, spinal receding, no headache and no backache Anesthetic complications: no   No complications documented.  Last Vitals:  Vitals:   03/09/20 0000 03/09/20 0015  BP: 134/89 (!) 141/69  Pulse: 71 69  Resp: 20 (!) 21  Temp:  36.6 C  SpO2: 95% 98%    Last Pain:  Vitals:   03/09/20 0015  TempSrc:   PainSc: 4    Pain Goal:    LLE Motor Response: Purposeful movement (03/09/20 0015) LLE Sensation: Tingling (03/09/20 0015) RLE Motor Response: Purposeful movement (03/09/20 0015) RLE Sensation: Tingling (03/09/20 0015)     Epidural/Spinal Function Cutaneous sensation: Able to Discern Pressure (03/09/20 0015), Patient able to flex knees: Yes (03/09/20 0015), Patient able to lift hips off bed: No (03/09/20 0015), Back pain beyond tenderness at insertion site: No (03/09/20 0015), Progressively worsening motor and/or sensory loss: No (03/09/20 0015), Bowel and/or bladder incontinence post epidural: No (03/09/20 0015)  Kaylyn Layer

## 2020-03-09 NOTE — Lactation Note (Signed)
This note was copied from a baby's chart. Lactation Consultation Note Baby 5 hrs old. Vanessa Rice stated baby latched well.  Vanessa Rice BF her 1st child for 2 months. Baby was fussy. LC checked diaper. Changed stool and void.  Gave baby to Vanessa Rice to try to latch since baby was awake. Vanessa Rice demonstrated hand expression of colostrum. Baby not interested. Baby had thick white emesis. LC used suction bulb to clear mouth.  Vanessa Rice has "V" shaped breast w/everted nipples.  Newborn feeding habits, STS, I&O, breast massage, supply and demand discussed.  Vanessa Rice tired. Encouraged Vanessa Rice to call for assistance or questions. Lactation brochure given.   Patient Name: Vanessa Rice [id number] Date: 03/09/2020 Reason for consult: Initial assessment;Early term 37-38.6wks;Maternal endocrine disorder Type of Endocrine Disorder?: Diabetes   Maternal Data Has patient been taught Hand Expression?: Yes Does the patient have breastfeeding experience prior to this delivery?: Yes  Feeding Feeding Type: Breast Fed  LATCH Score Latch: Too sleepy or reluctant, no latch achieved, no sucking elicited.  Audible Swallowing: None  Type of Nipple: Everted at rest and after stimulation  Comfort (Breast/Nipple): Soft / non-tender  Hold (Positioning): Assistance needed to correctly position infant at breast and maintain latch.  LATCH Score: 5  Interventions Interventions: Breast feeding basics reviewed;Breast compression;Assisted with latch;Adjust position;Skin to skin;Support pillows;Breast massage;Position options;Hand express  Lactation Tools Discussed/Used WIC Program: No   Consult Status Consult Status: Follow-up Date: 03/09/20 (pm) Follow-up type: In-patient    Charyl Dancer 03/09/2020, 4:39 AM

## 2020-03-09 NOTE — Progress Notes (Signed)
POSTPARTUM PROGRESS NOTE  Subjective: Vanessa Rice is a 34 y.o. [id number] s/p rLTCS at [redacted]w[redacted]d.  She reports she doing well. No acute events overnight. She has not yet gotten out of bed. She still has a foley in place. Denies nausea or vomiting Tolerating PO without issue.. She has not passed flatus. Pain is well controlled.  Lochia is moderate.  Objective: Blood pressure 135/84, pulse 60, temperature 98.2 F (36.8 C), temperature source Oral, resp. rate 16, height 5\' 3"  (1.6 m), weight 111.4 kg, last menstrual period 06/19/2019, SpO2 98 %, unknown if currently breastfeeding.  Physical Exam:  General: alert, cooperative and no distress Chest: no respiratory distress Abdomen: soft, non-tender  Uterine Fundus: firm and at level of umbilicus, pressure dressing in place without drainage  Extremities: No calf swelling or tenderness  no edema  Recent Labs    03/08/20 1900  HGB 12.1  HCT 36.4    Assessment/Plan: Vanessa Rice is a 34 y.o. 20 s/p rLTCS at [redacted]w[redacted]d for gHTN.  Routine Postpartum Care: Doing well, pain well-controlled.  -- Continue routine care, lactation support -- Post Op: EBL ~500ccs, preOp hgb 12.1, postOp pending.  -- Contraception: desires POP's -- Feeding: breast and bottle   -Gestational HTN: has been MR since delivery, will start procardia 30mg . PEC labs normal.   -A1GDM: AM sugar pending (though technically will not be fasting)    Dispo: Plan for discharge POD 2 or 3.  [redacted]w[redacted]d, MD OB Fellow, [facility] 03/09/2020 6:08 AM

## 2020-03-10 ENCOUNTER — Encounter: Payer: BC Managed Care – PPO | Admitting: Obstetrics & Gynecology

## 2020-03-10 MED ORDER — NORETHINDRONE 0.35 MG PO TABS: 1.0000 | ORAL_TABLET | Freq: Every day | ORAL | 11 refills | Status: DC

## 2020-03-10 MED ORDER — IBUPROFEN 800 MG PO TABS: 800.0000 mg | ORAL_TABLET | Freq: Four times a day (QID) | ORAL | 0 refills | Status: DC

## 2020-03-10 MED ORDER — NIFEDIPINE ER 30 MG PO TB24: 30.0000 mg | ORAL_TABLET | Freq: Every day | ORAL | 1 refills | Status: DC

## 2020-03-10 MED FILL — IBUPROFEN 800 MG TAB: 800 | 8 days supply | Qty: 30 | Fill #0

## 2020-03-10 MED FILL — NIFEdipine ER 30 MG TB24: 30 | 30 days supply | Qty: 30 | Fill #0

## 2020-03-10 MED FILL — NORETHINDRONE 0.35 MG TAB: 0.35 | 28 days supply | Qty: 28 | Fill #0

## 2020-03-10 NOTE — Lactation Note (Signed)
This note was copied from a baby's chart. Lactation Consultation Note Attempted to see mom, mom sleeping.   Patient Name: Vanessa Rice [id number]'T Date: 03/10/2020     Maternal Data    Feeding    LATCH Score                   Interventions    Lactation Tools Discussed/Used     Consult Status      Avagrace Botelho, Diamond Nickel 03/10/2020, 3:08 AM

## 2020-03-10 NOTE — Lactation Note (Addendum)
This note was copied from a baby's chart. Lactation Consultation Note  Patient Name: Vanessa Rice [id number] Date: 03/10/2020 Reason for consult: Follow-up assessment;Infant weight loss;Early term 37-38.6wks;Nipple pain/trauma Type of Endocrine Disorder?: Diabetes  Baby is 37 hours at the start of the [facility] consult - per Dr. Montine Circle.  Mom desires to be D/C early weight loss is 8 % , at 31 hours - Bili - 6.1  As LC entered the room, dad ready to hand the baby to mom to feed.  LC offered to assist to latch on the left breast / football / and worked on positioning and depth at the breast. Increased swallows with breast compressions and moist warm compress. Baby fed for 30 mins and stayed nutritive until the last 5 mins and LC had mom release the suction and LC assisted to switch to the other breast and showed dad how he could help mom obtain the depth. Less areola edema on the right compared to the left.  Baby fed for 18 mins with increased swallows. LC had to have mom release suction at 18 mins due to baby being non- nutritive.  LC reassured mom and dad baby fed well on both breast. LC reviewed potential feeding behaviors with early term infants with weight loss of 8 % .  Since baby fed so well at this feeding, will hold off on post pumping for now.  LC recommended if the baby ends up staying the RN can set up the DEBP , if she is able to go home to add post pumping both breast when the baby isn't cluster feeding for 10 -15 mins and feed back to baby / spoon ir artifical slow flow nipple .  Sore nipple and engorgement prevention and tx reviewed.  LC provided the DEBP with the hand pump with instructions of the hand pump.  Per mom has the DEBP at home [facility] ) and will have a kit to use it with.  LC discussed nutritive vs non- nutritive feeding patterns and the importance of watching the baby for hanging out latched. Storage of breast milk reviewed.  LC provided comfort gels x 6 days alternating with  shells while awake or 10 mims prior to feedings.  LC provided the Lc pamphlet with resource phone numbers and the made mom aware of the [employer].  LC recommended follow up next week with LC O/P with LC from [facility] or [facility] for reassurance since her 1st breast feeding experience was challenging.    Maternal Data Has patient been taught Hand Expression?: Yes  Feeding Feeding Type: Breast Fed  LATCH Score Latch: Grasps breast easily, tongue down, lips flanged, rhythmical sucking.  Audible Swallowing: Spontaneous and intermittent  Type of Nipple: Everted at rest and after stimulation  Comfort (Breast/Nipple): Soft / non-tender  Hold (Positioning): Assistance needed to correctly position infant at breast and maintain latch.  LATCH Score: 9  Interventions Interventions: Breast feeding basics reviewed;Assisted with latch;Skin to skin;Breast massage;Hand express;Reverse pressure;Breast compression;Adjust position;Support pillows;Position options;Shells;Comfort gels;Hand pump;DEBP  Lactation Tools Discussed/Used Tools: Shells;Pump;Comfort gels;Flanges Flange Size: 24;27 Shell Type: Inverted Breast pump type: Manual;Double-Electric Breast Pump (DEBP to be set up if moms stays) Pump Review: Milk Storage;Setup, frequency, and cleaning (hand pump)   Consult Status Consult Status: Follow-up Date: 03/11/20 Follow-up type: In-patient    [place] 03/10/2020, 12:50 PM

## 2020-03-10 NOTE — Discharge Instructions (Signed)
Postpartum Hypertension Postpartum hypertension is high blood pressure that remains higher than normal after childbirth. You may not realize that you have postpartum hypertension if your blood pressure is not being checked regularly. In most cases, postpartum hypertension will go away on its own, usually within a week of delivery. However, for some women, medical treatment is required to prevent serious complications, such as seizures or stroke. What are the causes? This condition may be caused by one or more of the following:  Hypertension that existed before pregnancy (chronic hypertension).  Hypertension that comes on as a result of pregnancy (gestational hypertension).  Hypertensive disorders during pregnancy (preeclampsia) or seizures in women who have high blood pressure during pregnancy (eclampsia).  A condition in which the liver, platelets, and red blood cells are damaged during pregnancy (HELLP syndrome).  A condition in which the thyroid produces too much hormones (hyperthyroidism).  Other rare problems of the nerves (neurological disorders) or blood disorders. In some cases, the cause may not be known. What increases the risk? The following factors may make you more likely to develop this condition:  Chronic hypertension. In some cases, this may not have been diagnosed before pregnancy.  Obesity.  Type 2 diabetes.  Kidney disease.  History of preeclampsia or eclampsia.  Other medical conditions that change the level of hormones in the body (hormonal imbalance). What are the signs or symptoms? As with all types of hypertension, postpartum hypertension may not have any symptoms. Depending on how high your blood pressure is, you may experience:  Headaches. These may be mild, moderate, or severe. They may also be steady, constant, or sudden in onset (thunderclap headache).  Changes in your ability to see (visual changes).  Dizziness.  Shortness of breath.  Swelling  of your hands, feet, lower legs, or face. In some cases, you may have swelling in more than one of these locations.  Heart palpitations or a racing heartbeat.  Difficulty breathing while lying down.  Decrease in the amount of urine that you pass. Other rare signs and symptoms may include:  Sweating more than usual. This lasts longer than a few days after delivery.  Chest pain.  Sudden dizziness when you get up from sitting or lying down.  Seizures.  Nausea or vomiting.  Abdominal pain. How is this diagnosed? This condition may be diagnosed based on the results of a physical exam, blood pressure measurements, and blood and urine tests. You may also have other tests, such as a CT scan or an MRI, to check for other problems of postpartum hypertension. How is this treated? If blood pressure is high enough to require treatment, your options may include:  Medicines to reduce blood pressure (antihypertensives). Tell your health care provider if you are breastfeeding or if you plan to breastfeed. There are many antihypertensive medicines that are safe to take while breastfeeding.  Stopping medicines that may be causing hypertension.  Treating medical conditions that are causing hypertension.  Treating the complications of hypertension, such as seizures, stroke, or kidney problems. Your health care provider will also continue to monitor your blood pressure closely until it is within a safe range for you. Follow these instructions at home:  Take over-the-counter and prescription medicines only as told by your health care provider.  Return to your normal activities as told by your health care provider. Ask your health care provider what activities are safe for you.  Do not use any products that contain nicotine or tobacco, such as cigarettes and e-cigarettes. If   you need help quitting, ask your health care provider.  Keep all follow-up visits as told by your health care provider. This  is important. Contact a health care provider if:  Your symptoms get worse.  You have new symptoms, such as: ? A headache that does not get better. ? Dizziness. ? Visual changes. Get help right away if:  You suddenly develop swelling in your hands, ankles, or face.  You have sudden, rapid weight gain.  You develop difficulty breathing, chest pain, racing heartbeat, or heart palpitations.  You develop severe pain in your abdomen.  You have any symptoms of a stroke. "BE FAST" is an easy way to remember the main warning signs of a stroke: ? B - Balance. Signs are dizziness, sudden trouble walking, or loss of balance. ? E - Eyes. Signs are trouble seeing or a sudden change in vision. ? F - Face. Signs are sudden weakness or numbness of the face, or the face or eyelid drooping on one side. ? A - Arms. Signs are weakness or numbness in an arm. This happens suddenly and usually on one side of the body. ? S - Speech. Signs are sudden trouble speaking, slurred speech, or trouble understanding what people say. ? T - Time. Time to call emergency services. Write down what time symptoms started.  You have other signs of a stroke, such as: ? A sudden, severe headache with no known cause. ? Nausea or vomiting. ? Seizure. These symptoms may represent a serious problem that is an emergency. Do not wait to see if the symptoms will go away. Get medical help right away. Call your local emergency services (911 in the [place] Do not drive yourself to the hospital. Summary  Postpartum hypertension is high blood pressure that remains higher than normal after childbirth.  In most cases, postpartum hypertension will go away on its own, usually within a week of delivery.  For some women, medical treatment is required to prevent serious complications, such as seizures or stroke. This information is not intended to replace advice given to you by your health care provider. Make sure you discuss any questions  you have with your health care provider. Document Revised: 07/25/2018 Document Reviewed: 04/08/2017 Elsevier Patient Education  2020 [employer] Cesarean Delivery, Care After This sheet gives you information about how to care for yourself after your procedure. Your health care provider may also give you more specific instructions. If you have problems or questions, contact your health care provider. What can I expect after the procedure? After the procedure, it is common to have:  A small amount of blood or clear fluid coming from the incision.  Some redness, swelling, and pain in your incision area.  Some abdominal pain and soreness.  Vaginal bleeding (lochia). Even though you did not have a vaginal delivery, you will still have vaginal bleeding and discharge.  Pelvic cramps.  Fatigue. You may have pain, swelling, and discomfort in the tissue between your vagina and your anus (perineum) if:  Your C-section was unplanned, and you were allowed to labor and push.  An incision was made in the area (episiotomy) or the tissue tore during attempted vaginal delivery. Follow these instructions at home: Incision care   Follow instructions from your health care provider about how to take care of your incision. Make sure you: ? Wash your hands with soap and water before you change your bandage (dressing). If soap and water are not available, use hand sanitizer. ? If you  have a dressing, change it or remove it as told by your health care provider. ? Leave stitches (sutures), skin staples, skin glue, or adhesive strips in place. These skin closures may need to stay in place for 2 weeks or longer. If adhesive strip edges start to loosen and curl up, you may trim the loose edges. Do not remove adhesive strips completely unless your health care provider tells you to do that.  Check your incision area every day for signs of infection. Check for: ? More redness, swelling, or pain. ? More fluid or  blood. ? Warmth. ? Pus or a bad smell.  Do not take baths, swim, or use a hot tub until your health care provider says it's okay. Ask your health care provider if you can take showers.  When you cough or sneeze, hug a pillow. This helps with pain and decreases the chance of your incision opening up (dehiscing). Do this until your incision heals. Medicines  Take over-the-counter and prescription medicines only as told by your health care provider.  If you were prescribed an antibiotic medicine, take it as told by your health care provider. Do not stop taking the antibiotic even if you start to feel better.  Do not drive or use heavy machinery while taking prescription pain medicine. Lifestyle  Do not drink alcohol. This is especially important if you are breastfeeding or taking pain medicine.  Do not use any products that contain nicotine or tobacco, such as cigarettes, e-cigarettes, and chewing tobacco. If you need help quitting, ask your health care provider. Eating and drinking  Drink at least 8 eight-ounce glasses of water every day unless told not to by your health care provider. If you breastfeed, you may need to drink even more water.  Eat high-fiber foods every day. These foods may help prevent or relieve constipation. High-fiber foods include: ? Whole grain cereals and breads. ? Brown rice. ? Beans. ? Fresh fruits and vegetables. Activity   If possible, have someone help you care for your baby and help with household activities for at least a few days after you leave the hospital.  Return to your normal activities as told by your health care provider. Ask your health care provider what activities are safe for you.  Rest as much as possible. Try to rest or take a nap while your baby is sleeping.  Do not lift anything that is heavier than 10 lbs (4.5 kg), or the limit that you were told, until your health care provider says that it is safe.  Talk with your health care  provider about when you can engage in sexual activity. This may depend on your: ? Risk of infection. ? How fast you heal. ? Comfort and desire to engage in sexual activity. General instructions  Do not use tampons or douches until your health care provider approves.  Wear loose, comfortable clothing and a supportive and well-fitting bra.  Keep your perineum clean and dry. Wipe from front to back when you use the toilet.  If you pass a blood clot, save it and call your health care provider to discuss. Do not flush blood clots down the toilet before you get instructions from your health care provider.  Keep all follow-up visits for you and your baby as told by your health care provider. This is important. Contact a health care provider if:  You have: ? A fever. ? Bad-smelling vaginal discharge. ? Pus or a bad smell coming from your incision. ?  Difficulty or pain when urinating. ? A sudden increase or decrease in the frequency of your bowel movements. ? More redness, swelling, or pain around your incision. ? More fluid or blood coming from your incision. ? A rash. ? Nausea. ? Little or no interest in activities you used to enjoy. ? Questions about caring for yourself or your baby.  Your incision feels warm to the touch.  Your breasts turn red or become painful or hard.  You feel unusually sad or worried.  You vomit.  You pass a blood clot from your vagina.  You urinate more than usual.  You are dizzy or light-headed. Get help right away if:  You have: ? Pain that does not go away or get better with medicine. ? Chest pain. ? Difficulty breathing. ? Blurred vision or spots in your vision. ? Thoughts about hurting yourself or your baby. ? New pain in your abdomen or in one of your legs. ? A severe headache.  You faint.  You bleed from your vagina so much that you fill more than one sanitary pad in one hour. Bleeding should not be heavier than your heaviest  period. Summary  After the procedure, it is common to have pain at your incision site, abdominal cramping, and slight bleeding from your vagina.  Check your incision area every day for signs of infection.  Tell your health care provider about any unusual symptoms.  Keep all follow-up visits for you and your baby as told by your health care provider. This information is not intended to replace advice given to you by your health care provider. Make sure you discuss any questions you have with your health care provider. Document Revised: 12/25/2017 Document Reviewed: 12/25/2017 Elsevier Patient Education  [place].

## 2020-03-10 NOTE — Lactation Note (Signed)
This note was copied from a baby's chart. Lactation Consultation Note Baby 39 hr old. Mom stated baby has been cluster feeding tonight. Baby sleeping, mom very tired.  Mom stated she thinks things are going well.  Patient Name: Vanessa Rice KQASU'O Date: 03/10/2020 Reason for consult: Follow-up assessment;Early term 37-38.6wks   Maternal Data    Feeding Feeding Type: Breast Fed  LATCH Score                   Interventions    Lactation Tools Discussed/Used     Consult Status Consult Status: Follow-up Date: 03/10/20 Follow-up type: In-patient    Charyl Dancer 03/10/2020, 6:22 AM

## 2020-03-11 ENCOUNTER — Encounter: Payer: BC Managed Care – PPO | Admitting: Obstetrics and Gynecology

## 2020-03-16 ENCOUNTER — Encounter (HOSPITAL_COMMUNITY): Payer: Self-pay | Admitting: Obstetrics and Gynecology

## 2020-03-16 ENCOUNTER — Ambulatory Visit (INDEPENDENT_AMBULATORY_CARE_PROVIDER_SITE_OTHER): Payer: BC Managed Care – PPO | Admitting: Advanced Practice Midwife

## 2020-03-16 ENCOUNTER — Encounter: Payer: Self-pay | Admitting: Advanced Practice Midwife

## 2020-03-16 ENCOUNTER — Other Ambulatory Visit: Payer: Self-pay

## 2020-03-16 ENCOUNTER — Inpatient Hospital Stay (HOSPITAL_COMMUNITY)
Admission: AD | Admit: 2020-03-16 | Discharge: 2020-03-16 | Disposition: A | Discharge status: Home / Self Care | Payer: BC Managed Care – PPO | Attending: Obstetrics and Gynecology | Admitting: Obstetrics and Gynecology

## 2020-03-16 VITALS — BP 132/82 | HR 98 | Ht 63.0 in | Wt 224.4 lb

## 2020-03-16 DIAGNOSIS — L7632 Postprocedural hematoma of skin and subcutaneous tissue following other procedure: Secondary | ICD-10-CM | POA: Insufficient documentation

## 2020-03-16 DIAGNOSIS — O8602 Infection of obstetric surgical wound, deep incisional site: Secondary | ICD-10-CM

## 2020-03-16 DIAGNOSIS — R239 Unspecified skin changes: Secondary | ICD-10-CM

## 2020-03-16 DIAGNOSIS — O8601 Infection of obstetric surgical wound, superficial incisional site: Secondary | ICD-10-CM

## 2020-03-16 DIAGNOSIS — O902 Hematoma of obstetric wound: Secondary | ICD-10-CM | POA: Diagnosis not present

## 2020-03-16 DIAGNOSIS — O139 Gestational [pregnancy-induced] hypertension without significant proteinuria, unspecified trimester: Secondary | ICD-10-CM

## 2020-03-16 DIAGNOSIS — T148XXA Other injury of unspecified body region, initial encounter: Secondary | ICD-10-CM

## 2020-03-16 MED ORDER — CEFADROXIL 500 MG PO CAPS: 500.0000 mg | ORAL_CAPSULE | Freq: Two times a day (BID) | ORAL | 0 refills | Status: DC

## 2020-03-16 MED ORDER — SILVER SULFADIAZINE 1 % EX CREA: 1.0000 "application " | TOPICAL_CREAM | Freq: Two times a day (BID) | CUTANEOUS | 0 refills | Status: DC

## 2020-03-16 NOTE — Progress Notes (Signed)
Patient presents for wound check after repeat LTCS on 03/08/2020. Honeycomb dressing is damp with serosanguinous drainage. Removed without difficulty, tolerated well by patient. Incision has noticeable odor. Left half of incision contains red vertical stripes beneath steri strips.  Right half of incision unremarkable, well-approximated. 1cm of induration along entire incision. Genella Rife, CNM at bedside to assist with assessment. Agrees with my recommendation for new rx Duricef, Silvadene BID. Recheck in one week  Gestational Hypertension, discharged on Procardia. Continue daily.  Patient to call clinic or seek care at closest ED for new onset fever, purulent drainage, bleeding, or signs of openings in incision. Patient verbalizes understanding.  Clayton Bibles, MSN, CNM Certified Nurse Midwife, [facility] for [employer], [facility] 03/16/20 3:23 PM

## 2020-03-16 NOTE — MAU Note (Signed)
Dr Alysia Penna in to see pt. Wound cleaned and small area mid incision open. Dr Alysia Penna packed area with gauze and then more gauze and dsg over incision. Pt instructed in care of incision and voiced understanding.

## 2020-03-16 NOTE — MAU Provider Note (Signed)
Vanessa Rice presents with c/o bleeding from her c section incision. Was seen today for incision check ? Infection vs reaction to stri strips. Placed on Duricef and Silvadene cream. Noted bleeding from incision tonight after getting up for dinner. She denies any fever. Feels sore but otherwise doing well.  GHTN, started on Procardia today.  PE AF VSS Lungs clears Heart RRR Abd soft + BS incision with small areas of separation noted, one area hematoma noted underneath, evacuated with sterile q tipand packed with guaze. Pt tolerated well  A/P Incisional hematoma  Pt instructed on wound care. Remove guaze in AM. Continue with Duricef. Discontinue Silvadene cream. Keep F/U appt in 1 week.

## 2020-03-16 NOTE — MAU Note (Signed)
Had repeat C/S on 03/08/20. Had incision ck today and honeycomb dsg and steri strips removed. Tonight pt got up and had blood leak down leg from incision.

## 2020-03-16 NOTE — Discharge Instructions (Signed)
Wound Care, Adult Taking care of your wound properly can help to prevent pain, infection, and scarring. It can also help your wound to heal more quickly. How to care for your wound Wound care      Follow instructions from your health care provider about how to take care of your wound. Make sure you: ? Wash your hands with soap and water before you change the bandage (dressing). If soap and water are not available, use hand sanitizer. ? Change your dressing as told by your health care provider. ? Leave stitches (sutures), skin glue, or adhesive strips in place. These skin closures may need to stay in place for 2 weeks or longer. If adhesive strip edges start to loosen and curl up, you may trim the loose edges. Do not remove adhesive strips completely unless your health care provider tells you to do that.  Check your wound area every day for signs of infection. Check for: ? Redness, swelling, or pain. ? Fluid or blood. ? Warmth. ? Pus or a bad smell.  Ask your health care provider if you should clean the wound with mild soap and water. Doing this may include: ? Using a clean towel to pat the wound dry after cleaning it. Do not rub or scrub the wound. ? Applying a cream or ointment. Do this only as told by your health care provider. ? Covering the incision with a clean dressing.  Ask your health care provider when you can leave the wound uncovered.  Keep the dressing dry until your health care provider says it can be removed. Do not take baths, swim, use a hot tub, or do anything that would put the wound underwater until your health care provider approves. Ask your health care provider if you can take showers. You may only be allowed to take sponge baths. Medicines   If you were prescribed an antibiotic medicine, cream, or ointment, take or use the antibiotic as told by your health care provider. Do not stop taking or using the antibiotic even if your condition improves.  Take  over-the-counter and prescription medicines only as told by your health care provider. If you were prescribed pain medicine, take it 30 or more minutes before you do any wound care or as told by your health care provider. General instructions  Return to your normal activities as told by your health care provider. Ask your health care provider what activities are safe.  Do not scratch or pick at the wound.  Do not use any products that contain nicotine or tobacco, such as cigarettes and e-cigarettes. These may delay wound healing. If you need help quitting, ask your health care provider.  Keep all follow-up visits as told by your health care provider. This is important.  Eat a diet that includes protein, vitamin A, vitamin C, and other nutrient-rich foods to help the wound heal. ? Foods rich in protein include meat, dairy, beans, nuts, and other sources. ? Foods rich in vitamin A include carrots and dark green, leafy vegetables. ? Foods rich in vitamin C include citrus, tomatoes, and other fruits and vegetables. ? Nutrient-rich foods have protein, carbohydrates, fat, vitamins, or minerals. Eat a variety of healthy foods including vegetables, fruits, and whole grains. Contact a health care provider if:  You received a tetanus shot and you have swelling, severe pain, redness, or bleeding at the injection site.  Your pain is not controlled with medicine.  You have redness, swelling, or pain around the wound.    You have fluid or blood coming from the wound.  Your wound feels warm to the touch.  You have pus or a bad smell coming from the wound.  You have a fever or chills.  You are nauseous or you vomit.  You are dizzy. Get help right away if:  You have a red streak going away from your wound.  The edges of the wound open up and separate.  Your wound is bleeding, and the bleeding does not stop with gentle pressure.  You have a rash.  You faint.  You have trouble  breathing. Summary  Always wash your hands with soap and water before changing your bandage (dressing).  To help with healing, eat foods that are rich in protein, vitamin A, vitamin C, and other nutrients.  Check your wound every day for signs of infection. Contact your health care provider if you suspect that your wound is infected. This information is not intended to replace advice given to you by your health care provider. Make sure you discuss any questions you have with your health care provider. Document Revised: 10/06/2018 Document Reviewed: 01/03/2016 Elsevier Patient Education  2020 Elsevier Inc.  

## 2020-03-17 ENCOUNTER — Encounter: Payer: Self-pay | Admitting: *Deleted

## 2020-03-19 ENCOUNTER — Inpatient Hospital Stay (HOSPITAL_COMMUNITY): Admit: 2020-03-19 | Payer: BC Managed Care – PPO | Admitting: Obstetrics & Gynecology

## 2020-03-23 ENCOUNTER — Encounter: Payer: Self-pay | Admitting: Advanced Practice Midwife

## 2020-03-23 ENCOUNTER — Ambulatory Visit (INDEPENDENT_AMBULATORY_CARE_PROVIDER_SITE_OTHER): Payer: BC Managed Care – PPO | Admitting: Advanced Practice Midwife

## 2020-03-23 DIAGNOSIS — O139 Gestational [pregnancy-induced] hypertension without significant proteinuria, unspecified trimester: Secondary | ICD-10-CM

## 2020-03-23 NOTE — Progress Notes (Signed)
   GYN VISIT Patient name: Vanessa Rice MRN [id number]  Date of birth: Nov 20, 1985 Chief Complaint:   Routine Post Op  History of Present Illness:   Vanessa Rice is a 34 y.o. [phone] Caucasian female being seen today for 2wk rLTCS incision check after dx of incisional hematoma 03/16/20. Reports has been doing well and has completed Duricef and Silvadene course. No fever, drainage, or other s/s infection. Now supplementing her breastfeeding due to minimal infant weight gain.  Depression screen [facility] 2/9 02/01/2020 09/18/2019  Decreased Interest 0 0  Down, Depressed, Hopeless 0 0  PHQ - 2 Score 0 0  Altered sleeping 0 -  Tired, decreased energy 1 -  Change in appetite 0 -  Feeling bad or failure about yourself  0 -  Trouble concentrating 0 -  Moving slowly or fidgety/restless 0 -  Suicidal thoughts 0 -  PHQ-9 Score 1 -    No LMP recorded. The current method of family planning is Micronor to start at 4wks postop.  Last pap March 2021. Results were:  normal Review of Systems:   Pertinent items are noted in HPI Denies fever/chills, dizziness, headaches, visual disturbances, fatigue, shortness of breath, chest pain, abdominal pain, vomiting, abnormal vaginal discharge/itching/odor/irritation, problems with periods, bowel movements, urination, or intercourse unless otherwise stated above.  Pertinent History Reviewed:  Reviewed past medical,surgical, social, obstetrical and family history.  Reviewed problem list, medications and allergies. Physical Assessment:   Vitals:   03/23/20 1559  BP: 127/87  Pulse: 90  Weight: 221 lb 8 oz (100.5 kg)  Height: 5\' 3"  (1.6 m)  Body mass index is 39.24 kg/m.       Physical Examination:   General appearance: alert, well appearing, and in no distress  Mental status: alert, oriented to person, place, and time  Skin: warm & dry   Cardiovascular: normal heart rate noted  Respiratory: normal respiratory effort, no distress  Abdomen: soft,  appropriately tender, rLTCS incision is clean and dry, well approximated without s/s infection  Pelvic: examination not indicated  Extremities: no edema   No results found for this or any previous visit (from the past 24 hour(s)).  Assessment & Plan:  1) s/p rLTCS x 2wks with incisional hematoma at 1wk> doing well  2) gHTN on Procardia 30 XL> stable BPs  3) GDMA1> will get GTT at 6-10wks PP  4) Micronor for pp contraception> start 4 wks PP  Meds: No orders of the defined types were placed in this encounter.   No orders of the defined types were placed in this encounter.   Return for As scheduled.  CNM 03/23/2020 4:13 PM

## 2020-04-12 ENCOUNTER — Encounter: Payer: Self-pay | Admitting: Women's Health

## 2020-04-12 ENCOUNTER — Ambulatory Visit (INDEPENDENT_AMBULATORY_CARE_PROVIDER_SITE_OTHER): Payer: BC Managed Care – PPO | Admitting: Women's Health

## 2020-04-12 DIAGNOSIS — Z3009 Encounter for other general counseling and advice on contraception: Secondary | ICD-10-CM

## 2020-04-12 DIAGNOSIS — Z98891 History of uterine scar from previous surgery: Secondary | ICD-10-CM

## 2020-04-12 NOTE — Patient Instructions (Addendum)
Stop the blood pressure medicine  You will have your sugar test next visit.  Please do not eat or drink anything after midnight the night before you come, not even water.  You will be here for at least two hours.  Please make an appointment online for the bloodwork at [email or web address] for 8:30am (or as close to this as possible). Make sure you select the [facility] service center. The day of the appointment, check in with our office first, then you will go to [facility] to start the sugar test.    Tips To Increase Milk Supply  Lots of water! Enough so that your urine is clear  Plenty of calories, if you're not getting enough calories, your milk supply can decrease  Breastfeed/pump often, every 2-3 hours x 20-68mins  Fenugreek 3 pills 3 times a day, this may make your urine smell like maple syrup  Mother's Milk Tea  Lactation cookies, google for the recipe  Real oatmeal  Body Armor sports drinks  Greater Than hydration drink

## 2020-04-12 NOTE — Progress Notes (Signed)
POSTPARTUM VISIT Patient name: Vanessa Rice MRN [id number]  Date of birth: 1986-05-19 Chief Complaint:   Postpartum Care  History of Present Illness:   Vanessa Rice is a 34 y.o. [phone] Caucasian female being seen today for a postpartum visit. She is 5 weeks postpartum following a repeat cesarean section, low transverse incision at 37.4 gestational weeks d/t GHTN. IOL: No, for n/a. Anesthesia: spinal.  Laceration: n/a.  Complications: none. Inpatient contraception: no.   Pregnancy complicated by P1SR, GHTN. Tobacco use: no. Substance use disorder: no. Last pap smear: 09/18/19 and results were normal. Next pap smear due: 63yr No LMP recorded.  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 Bleeding thin lochia. Bowel function is normal. Bladder function is normal. Urinary incontinence? No, fecal incontinence? No Patient is not sexually active. Last sexual activity: prior to birth of baby.  Desired contraception: taking POPs. Patient does not want a pregnancy in the near future.  Desired family size is unsure children.   Upstream - 04/12/20 1125      Pregnancy Intention Screening   Does the patient want to become pregnant in the next year? No    Does the patient's partner want to become pregnant in the next year? No    Would the patient like to discuss contraceptive options today? No      Contraception Wrap Up   Current Method Oral Contraceptive    End Method Oral Contraceptive    Contraception Counseling Provided No          The pregnancy intention screening data noted above was reviewed. Potential methods of contraception were discussed. The patient elected to proceed with Oral Contraceptive.   Edinburgh Postpartum Depression Screening: negative  Edinburgh Postnatal Depression Scale - 04/12/20 1125      Edinburgh Postnatal Depression Scale:  In the Past 7 Days   I have been able to laugh and see the funny side of things. 0    I  have looked forward with enjoyment to things. 0    I have blamed myself unnecessarily when things went wrong. 1    I have been anxious or worried for no good reason. 0    I have felt scared or panicky for no good reason. 0    Things have been getting on top of me. 1    I have been so unhappy that I have had difficulty sleeping. 0    I have felt sad or miserable. 0    I have been so unhappy that I have been crying. 0    The thought of harming myself has occurred to me. 0    Edinburgh Postnatal Depression Scale Total 2          Baby's course has been uncomplicated. Baby is feeding by breast and bottle: milk supply inadequate . Infant has a pediatrician/family doctor? Yes.  Childcare strategy if returning to work/school: family.  Pt has material needs met for her and baby: Yes.   Review of Systems:   Pertinent items are noted in HPI Denies Abnormal vaginal discharge w/ itching/odor/irritation, headaches, visual changes, shortness of breath, chest pain, abdominal pain, severe nausea/vomiting, or problems with urination or bowel movements. Pertinent History Reviewed:  Reviewed past medical,surgical, obstetrical and family history.  Reviewed problem list, medications and allergies. OB History  Gravida Para Term Preterm AB Living  _0 0 2 2  SAB TAB Ectopic Multiple Live Births  2 0 0 0 2    #  Outcome Date GA Lbr Len/2nd Weight Sex Delivery Anes PTL Lv  4 Term 03/08/20 [redacted]w[redacted]d 6 lb 3.8 oz (2.83 kg) F CS-LTranv Spinal  LIV  3 SAB 01/2018          2 Term 09/12/15 [id number] lb 13 oz (3.09 kg) M CS-LTranv EPI  LIV  1 SAB 08/2014 [id number]       Physical Assessment:   Vitals:   04/12/20 1124  BP: 114/79  Pulse: 76  Weight: 221 lb (100.2 kg)  Height: _0  (1.6 m)  Body mass index is 39.15 kg/m.       Physical Examination:   General appearance: alert, well appearing, and in no distress  Mental status: alert, oriented to person, place, and time  Skin: warm & dry   Cardiovascular:  normal heart rate noted   Respiratory: normal respiratory effort, no distress   Breasts: deferred, no complaints   Abdomen: soft, non-tender, c/s incision healing well, one small spot in center still open, visible suture clipped, red/patchy areas around incision c/w yeast- painted w/ gentian violet  Pelvic: examination not indicated  Rectal: not examined  Extremities: no edema       No results found for this or any previous visit (from the past 24 hour(s)).  Assessment & Plan:  1) Postpartum exam 2) 5 wks s/p repeat cesarean section, low transverse incision at 37.4wk d/t GHTN 3) breast & bottle feeding> gave printed tips to increase milk supply 4) Depression screening 5) Contraception counseling> take micronor at same time daily, if stops breastfeeding let us [place] and will switch to COCs 6) PPHTN> stop procardia, f/u Friday for bp check w/ nurse, can be online  Essential components of care per ACOG recommendations:  1.  Mood and well being:  . If positive depression screen, discussed and plan developed.  . If using tobacco we discussed reduction/cessation and risk of relapse . If current substance abuse, we discussed and referral to local resources was offered.   2. Infant care and feeding:  . If breastfeeding, discussed returning to work, pumping, breastfeeding-associated pain, guidance regarding return to fertility while lactating if not using another method. If needed, patient was provided with a letter to be allowed to pump q 2-3hrs to support lactation in a private location with access to a refrigerator to store breastmilk.   . Recommended that all caregivers be immunized for flu, pertussis and other preventable communicable diseases . If pt does not have material needs met for her/baby, referred to local resources for help obtaining these.  3. Sexuality, contraception and birth spacing . Provided guidance regarding sexuality, management of dyspareunia, and resumption of  intercourse . Discussed avoiding interpregnancy interval <6mt86mand recommended birth spacing of 18 months  4. Sleep and fatigue . Discussed coping options for fatigue and sleep disruption . Encouraged family/partner/community support of 4 hrs of uninterrupted sleep to help with mood and fatigue  5. Physical recovery  . If pt had a C/S, assessed incisional pain and providing guidance on normal vs prolonged recovery . If pt had a laceration, perineal healing and pain reviewed.  . If urinary or fecal incontinence, discussed management and referred to PT or uro/gyn if indicated  . Patient is safe to resume physical activity. Discussed attainment of healthy weight.  6.  Chronic disease management . Discussed pregnancy complications if any, and their implications for future childbearing and long-term maternal health. . Review recommendations for prevention of recurrent pregnancy complications, such  as 17 hydroxyprogesterone caproate to reduce risk for recurrent PTB not applicable, or aspirin to reduce risk of preeclampsia yes. . Pt had GDM: Yes. If yes, 2hr GTT scheduled: yes. . Reviewed medications and non-pregnant dosing including consideration of whether pt is breastfeeding using a reliable resource such as LactMed: yes . Referred for f/u w/ PCP or subspecialist providers as indicated: not applicable  7. Health maintenance . Mammogram at 34yo or earlier if indicated . Pap smears as indicated  Meds: No orders of the defined types were placed in this encounter.   Follow-up: Return for Fri bp check w/ nurse online; then 3wks for sugar test (no visit).   No orders of the defined types were placed in this encounter.   [place], [facility] 04/12/2020 12:52 PM

## 2020-04-15 ENCOUNTER — Encounter: Payer: Self-pay | Admitting: *Deleted

## 2020-04-15 ENCOUNTER — Telehealth (INDEPENDENT_AMBULATORY_CARE_PROVIDER_SITE_OTHER): Payer: BC Managed Care – PPO | Admitting: *Deleted

## 2020-04-15 VITALS — BP 114/88 | HR 72

## 2020-04-15 DIAGNOSIS — Z0130 Encounter for examination of blood pressure without abnormal findings: Secondary | ICD-10-CM

## 2020-04-15 NOTE — Progress Notes (Signed)
   I connected with  Vanessa Rice on 04/15/20 by a video enabled telemedicine application and verified that I am speaking with the correct person using two identifiers.   I discussed the limitations of evaluation and management by telemedicine. The patient expressed understanding and agreed to proceed.  NURSE VISIT- BLOOD PRESSURE CHECK  SUBJECTIVE:  Vanessa Rice is a 34 y.o. [phone] female here for BP check. She is postpartum, delivery date 03/08/20    HYPERTENSION ROS:  Postpartum:  . Severe headaches that don't go away with tylenol/other medicines: No  . Visual changes (seeing spots/double/blurred vision) No  . Severe pain under right breast breast or in center of upper chest No  . Severe nausea/vomiting No  . Taking medicines as instructed not applicable   OBJECTIVE:  BP 114/88 (BP Location: Left Arm, Patient Position: Sitting, Cuff Size: Normal)   Pulse 72   Appearance alert, well appearing, and in no distress and oriented to person, place, and time.  ASSESSMENT: Postpartum  blood pressure check Has been checking her blood pressure daily and all readings have been in teens systolic and low 67'T for diastolic.    PLAN: Discussed with Joellyn Haff, CNM, [facility]   Recommendations: no changes needed   Follow-up: as scheduled   Jobe Marker  04/15/2020 10:27 AM   Chart reviewed for nurse visit. Agree with plan of care.  Cheral Marker, [place] 04/15/2020 10:44 AM

## 2020-05-03 ENCOUNTER — Other Ambulatory Visit: Payer: BC Managed Care – PPO

## 2020-05-03 DIAGNOSIS — Z8632 Personal history of gestational diabetes: Secondary | ICD-10-CM

## 2020-05-04 LAB — GLUCOSE TOLERANCE, 2 HOURS W/ 1HR
Glucose, 1 hour: 101 mg/dL (ref 65–179)
Glucose, 2 hour: 94 mg/dL (ref 65–152)
Glucose, Fasting: 93 mg/dL — ABNORMAL HIGH (ref 65–91)

## 2020-05-05 ENCOUNTER — Other Ambulatory Visit: Payer: Self-pay | Admitting: Women's Health

## 2020-05-05 DIAGNOSIS — R7309 Other abnormal glucose: Secondary | ICD-10-CM

## 2020-05-05 DIAGNOSIS — Z8632 Personal history of gestational diabetes: Secondary | ICD-10-CM

## 2020-05-25 ENCOUNTER — Other Ambulatory Visit: Payer: Self-pay | Admitting: Women's Health

## 2020-05-25 MED ORDER — LO LOESTRIN FE 1 MG-10 MCG / 10 MCG PO TABS: 1.0000 | ORAL_TABLET | Freq: Every day | ORAL | 3 refills | Status: DC

## 2020-08-06 LAB — HEMOGLOBIN A1C
Est. average glucose Bld gHb Est-mCnc: 117 mg/dL
Hgb A1c MFr Bld: 5.7 % — ABNORMAL HIGH (ref 4.8–5.6)

## 2020-08-08 ENCOUNTER — Other Ambulatory Visit: Payer: Self-pay | Admitting: Women's Health

## 2020-08-08 DIAGNOSIS — Z8632 Personal history of gestational diabetes: Secondary | ICD-10-CM

## 2020-08-08 DIAGNOSIS — R7303 Prediabetes: Secondary | ICD-10-CM | POA: Insufficient documentation

## 2020-12-05 ENCOUNTER — Other Ambulatory Visit: Payer: Self-pay

## 2020-12-05 ENCOUNTER — Ambulatory Visit
Admission: EM | Admit: 2020-12-05 | Discharge: 2020-12-05 | Disposition: A | Discharge status: Home / Self Care | Payer: BC Managed Care – PPO | Attending: Family Medicine | Admitting: Family Medicine

## 2020-12-05 ENCOUNTER — Encounter: Payer: Self-pay | Admitting: *Deleted

## 2020-12-05 DIAGNOSIS — R1011 Right upper quadrant pain: Secondary | ICD-10-CM | POA: Diagnosis not present

## 2020-12-05 DIAGNOSIS — R112 Nausea with vomiting, unspecified: Secondary | ICD-10-CM | POA: Diagnosis not present

## 2020-12-05 LAB — POCT URINALYSIS DIP (MANUAL ENTRY)
Bilirubin, UA: NEGATIVE
Blood, UA: NEGATIVE
Glucose, UA: NEGATIVE mg/dL
Ketones, POC UA: NEGATIVE mg/dL
Nitrite, UA: NEGATIVE
Protein Ur, POC: NEGATIVE mg/dL
Spec Grav, UA: 1.015 (ref 1.010–1.025)
Urobilinogen, UA: 0.2 E.U./dL
pH, UA: 6.5 (ref 5.0–8.0)

## 2020-12-05 MED ORDER — ONDANSETRON HCL 4 MG PO TABS: 4.0000 mg | ORAL_TABLET | Freq: Four times a day (QID) | ORAL | 0 refills | Status: DC

## 2020-12-05 NOTE — ED Triage Notes (Signed)
Pt presents with right upper quadrant pain that was severe earlier. Pt states she has had this pain before and feels better after vomiting

## 2020-12-05 NOTE — Discharge Instructions (Addendum)
I have sent in Zofran for you to take one tablet every 8 hours as needed for nausea.  Culture your urine just to be sure there is no infection.  We have drawn some labs today, we will be in touch with any abnormal results that require further treatment  Follow up with this office or with primary care if symptoms are persisting.  Follow up in the ER for high fever, trouble swallowing, trouble breathing, other concerning symptoms.

## 2020-12-05 NOTE — ED Provider Notes (Signed)
[facility]    CSN: [id number] Arrival date & time: 12/05/20  1203      History   Chief Complaint Chief Complaint  Patient presents with  . Abdominal Pain    HPI Vanessa Rice is a 35 y.o. female.   Reports moderate to severe intermittent right upper quadrant pain that has been going on for the last 6 months.  Reports today was the worst severity, states that she was shaking, and fell like she was going to pass out.  Has not attempted OTC treatment.  Reports nausea and vomiting after eating as well.  States that she does still have her gallbladder.  Denies dysuria, urinary frequency, urgency, flank pain, chills, fever, other symptoms.  ROS per HPI  The history is provided by the patient.  Abdominal Pain   Past Medical History:  Diagnosis Date  . Acute blood loss anemia 09/13/2015  . Bronchial spasms   . Gestational diabetes 01/12/2020  . Mild preeclampsia 09/09/2015    Patient Active Problem List   Diagnosis Date Noted  . Pre-diabetes 08/08/2020  . History of gestational diabetes 01/12/2020  . Previous cesarean section 09/12/2015  . Hx of preeclampsia & PPHTN 09/09/2015    Past Surgical History:  Procedure Laterality Date  . CESAREAN SECTION N/A 09/12/2015   Procedure: CESAREAN SECTION;  Surgeon: [name], MD;  Location: [facility];  Service: Obstetrics;  Laterality: N/A;  . CESAREAN SECTION N/A 03/08/2020   Procedure: CESAREAN SECTION;  Surgeon: Reva Bores, MD;  Location: [facility];  Service: Obstetrics;  Laterality: N/A;  . WISDOM TOOTH EXTRACTION  04/2011    OB History    Gravida  4   Para  2   Term  2   Preterm  0   AB  2   Living  2     SAB  2   IAB  0   Ectopic  0   Multiple  0   Live Births  2            Home Medications    Prior to Admission medications   Medication Sig Start Date End Date Taking? Authorizing Provider  ondansetron (ZOFRAN) 4 MG tablet Take 1 tablet (4 mg total) by mouth every 6 (six) hours.  12/05/20  Yes Moshe Cipro, NP  Blood Pressure Monitor MISC For regular home bp monitoring during pregnancy 09/18/19   Cheral Marker, CNM  ibuprofen (ADVIL) 800 MG tablet Take 1 tablet (800 mg total) by mouth every 6 (six) hours. Patient not taking: Reported on 03/23/2020 03/10/20   Aviva Signs, CNM  LO LOESTRIN FE 1 MG-10 MCG / 10 MCG tablet Take 1 tablet by mouth daily. 05/25/20   Cheral Marker, CNM  NIFEdipine (ADALAT CC) 30 MG 24 hr tablet Take 1 tablet (30 mg total) by mouth daily. Patient not taking: Reported on 04/15/2020 03/10/20   Aviva Signs, CNM  Prenatal Vit-Fe Fumarate-FA (MULTIVITAMIN-PRENATAL) 27-0.8 MG TABS tablet Take 1 tablet by mouth daily at 12 noon.     [provider]    Family History Family History  Problem Relation Age of Onset  . Hypertension Mother   . Hyperlipidemia Mother   . Polycystic ovary syndrome Sister   . Diabetes Maternal Grandmother   . Thyroid disease Maternal Grandmother   . Diabetes Maternal Grandfather   . Breast cancer Paternal Grandmother   . Breast cancer Maternal Aunt 52  . Cancer Maternal Aunt 79  breast  . Other Maternal Aunt        Platelet grandular defect  . Cancer Paternal Aunt        breast cancer    Social History Social History   Tobacco Use  . Smoking status: Never Smoker  . Smokeless tobacco: Never Used  Vaping Use  . Vaping Use: Never used  Substance Use Topics  . Alcohol use: No  . Drug use: No     Allergies   Patient has no known allergies.   Review of Systems Review of Systems  Gastrointestinal: Positive for abdominal pain.     Physical Exam Triage Vital Signs ED Triage Vitals  Enc Vitals Group     BP 12/05/20 1403 126/84     Pulse Rate 12/05/20 1403 87     Resp 12/05/20 1403 18     Temp 12/05/20 1403 97.9 F (36.6 C)     Temp src --      SpO2 12/05/20 1403 97 %     Weight --      Height --      Head Circumference --      Peak Flow --      Pain Score  12/05/20 1401 0     Pain Loc --      Pain Edu? --      Excl. in GC? --    No data found.  Updated Vital Signs BP 126/84   Pulse 87   Temp 97.9 F (36.6 C)   Resp 18   LMP 11/07/2020 (Approximate)   SpO2 97%      Physical Exam Vitals and nursing note reviewed.  Constitutional:      General: She is not in acute distress.    Appearance: She is well-developed.  HENT:     Head: Normocephalic and atraumatic.     Nose: Nose normal.     Mouth/Throat:     Mouth: Mucous membranes are moist.     Pharynx: Oropharynx is clear.  Eyes:     Extraocular Movements: Extraocular movements intact.     Conjunctiva/sclera: Conjunctivae normal.     Pupils: Pupils are equal, round, and reactive to light.  Cardiovascular:     Rate and Rhythm: Normal rate and regular rhythm.  Pulmonary:     Effort: Pulmonary effort is normal. No respiratory distress.  Abdominal:     General: Abdomen is flat. There is no distension.     Palpations: Abdomen is soft. There is no mass.     Tenderness: There is abdominal tenderness (RUQ, + Murphy's sign). There is no right CVA tenderness, left CVA tenderness, guarding or rebound.     Hernia: No hernia is present.  Musculoskeletal:        General: Normal range of motion.     Cervical back: Normal range of motion and neck supple.  Skin:    General: Skin is warm and dry.     Capillary Refill: Capillary refill takes less than 2 seconds.  Neurological:     General: No focal deficit present.     Mental Status: She is alert and oriented to person, place, and time.  Psychiatric:        Mood and Affect: Mood normal.        Behavior: Behavior normal.        Thought Content: Thought content normal.      UC Treatments / Results  Labs (all labs ordered are listed, but only abnormal results are displayed) Labs Reviewed  POCT URINALYSIS DIP (MANUAL ENTRY) - Abnormal; Notable for the following components:      Result Value   Leukocytes, UA Moderate (2+) (*)    All  other components within normal limits  URINE CULTURE  CBC WITH DIFFERENTIAL/PLATELET  COMPREHENSIVE METABOLIC PANEL  LIPASE    EKG   Radiology No results found.  Procedures Procedures (including critical care time)  Medications Ordered in UC Medications - No data to display  Initial Impression / Assessment and Plan / UC Course  I have reviewed the triage vital signs and the nursing notes.  Pertinent labs & imaging results that were available during my care of the patient were reviewed by me and considered in my medical decision making (see chart for details).    RUQ pain Nausea and vomiting  UA nonconcerning for infection today Zofran prescribed as needed nausea Referral placed for GI Is not followed by primary care Discussed that this could be cholecystitis, and she may need to have her gallbladder surgically removed If your pain gets as bad as it was earlier today, if you are shaking with severe pain, follow-up in the ER  Final Clinical Impressions(s) / UC Diagnoses   Final diagnoses:  RUQ pain  Nausea and vomiting, intractability of vomiting not specified, unspecified vomiting type     Discharge Instructions     I have sent in Zofran for you to take one tablet every 8 hours as needed for nausea.  Culture your urine just to be sure there is no infection.  We have drawn some labs today, we will be in touch with any abnormal results that require further treatment  Follow up with this office or with primary care if symptoms are persisting.  Follow up in the ER for high fever, trouble swallowing, trouble breathing, other concerning symptoms.     ED Prescriptions    Medication Sig Dispense Auth. Provider   ondansetron (ZOFRAN) 4 MG tablet Take 1 tablet (4 mg total) by mouth every 6 (six) hours. 12 tablet Moshe Cipro, NP     PDMP not reviewed this encounter.   Moshe Cipro, NP 12/05/20 1447

## 2020-12-06 LAB — CBC WITH DIFFERENTIAL/PLATELET
Basophils Absolute: 0 10*3/uL (ref 0.0–0.2)
Basos: 0 %
EOS (ABSOLUTE): 0 10*3/uL (ref 0.0–0.4)
Eos: 0 %
Hematocrit: 43.6 % (ref 34.0–46.6)
Hemoglobin: 14.2 g/dL (ref 11.1–15.9)
Immature Grans (Abs): 0.1 10*3/uL (ref 0.0–0.1)
Immature Granulocytes: 1 %
Lymphocytes Absolute: 0.8 10*3/uL (ref 0.7–3.1)
Lymphs: 5 %
MCH: 29.8 pg (ref 26.6–33.0)
MCHC: 32.6 g/dL (ref 31.5–35.7)
MCV: 92 fL (ref 79–97)
Monocytes Absolute: 0.4 10*3/uL (ref 0.1–0.9)
Monocytes: 3 %
Neutrophils Absolute: 14.7 10*3/uL — ABNORMAL HIGH (ref 1.4–7.0)
Neutrophils: 91 %
Platelets: 335 10*3/uL (ref 150–450)
RBC: 4.76 x10E6/uL (ref 3.77–5.28)
RDW: 12 % (ref 11.7–15.4)
WBC: 16.1 10*3/uL — ABNORMAL HIGH (ref 3.4–10.8)

## 2020-12-06 LAB — COMPREHENSIVE METABOLIC PANEL
ALT: 70 IU/L — ABNORMAL HIGH (ref 0–32)
AST: 81 IU/L — ABNORMAL HIGH (ref 0–40)
Albumin/Globulin Ratio: 1.5 (ref 1.2–2.2)
Albumin: 4.3 g/dL (ref 3.8–4.8)
Alkaline Phosphatase: 110 IU/L (ref 44–121)
BUN/Creatinine Ratio: 13 (ref 9–23)
BUN: 9 mg/dL (ref 6–20)
Bilirubin Total: 0.5 mg/dL (ref 0.0–1.2)
CO2: 19 mmol/L — ABNORMAL LOW (ref 20–29)
Calcium: 9.7 mg/dL (ref 8.7–10.2)
Chloride: 104 mmol/L (ref 96–106)
Creatinine, Ser: 0.71 mg/dL (ref 0.57–1.00)
Globulin, Total: 2.9 g/dL (ref 1.5–4.5)
Glucose: 111 mg/dL — ABNORMAL HIGH (ref 65–99)
Potassium: 4.7 mmol/L (ref 3.5–5.2)
Sodium: 137 mmol/L (ref 134–144)
Total Protein: 7.2 g/dL (ref 6.0–8.5)
eGFR: 114 mL/min/{1.73_m2} (ref >59)

## 2020-12-06 LAB — LIPASE: Lipase: 39 U/L (ref 14–72)

## 2020-12-07 LAB — URINE CULTURE

## 2021-01-11 ENCOUNTER — Ambulatory Visit: Payer: BC Managed Care – PPO | Admitting: Internal Medicine

## 2021-01-25 ENCOUNTER — Ambulatory Visit (INDEPENDENT_AMBULATORY_CARE_PROVIDER_SITE_OTHER): Payer: BC Managed Care – PPO | Admitting: Internal Medicine

## 2021-01-25 ENCOUNTER — Encounter: Payer: Self-pay | Admitting: Internal Medicine

## 2021-01-25 ENCOUNTER — Other Ambulatory Visit: Payer: Self-pay

## 2021-01-25 ENCOUNTER — Encounter: Payer: Self-pay | Admitting: *Deleted

## 2021-01-25 VITALS — BP 146/97 | HR 87 | Temp 97.8°F | Ht 63.0 in | Wt 234.8 lb

## 2021-01-25 DIAGNOSIS — R7989 Other specified abnormal findings of blood chemistry: Secondary | ICD-10-CM

## 2021-01-25 DIAGNOSIS — R945 Abnormal results of liver function studies: Secondary | ICD-10-CM | POA: Diagnosis not present

## 2021-01-25 DIAGNOSIS — R110 Nausea: Secondary | ICD-10-CM | POA: Diagnosis not present

## 2021-01-25 DIAGNOSIS — R1011 Right upper quadrant pain: Secondary | ICD-10-CM | POA: Diagnosis not present

## 2021-01-25 NOTE — Progress Notes (Signed)
Primary Care Physician:  Pcp, No Primary Gastroenterologist:  Dr. Abbey Chatters  Chief Complaint  Patient presents with   Abdominal Pain    RUQ abd pain under rib cage, vomiting    HPI:   Vanessa Rice is a 35 y.o. female who presents to the clinic today by referral from her PCP Faustino Congress for evaluation.  Patient states since January she has had 6-7 episodes of severe right upper quadrant pain.  During these attacks, pain does not radiate.  Does note some associated nausea and vomiting with these as well.  No change in her bowel movements.  No melena hematochezia.  No chronic NSAID use.  No history of peptic ulcer disease.  Does note her mother had her gallbladder removed due to issues in the past.  Notes some occasional heartburn but no dysphagia or odynophagia.  No family history of colorectal malignancy.  Blood work from 12/05/2020 did show elevated AST 81, ALT 70, T bili normal, alk phos 110.   Otherwise no other complaints.  Past Medical History:  Diagnosis Date   Acute blood loss anemia 09/13/2015   Bronchial spasms    Gestational diabetes 01/12/2020   Mild preeclampsia 09/09/2015    Past Surgical History:  Procedure Laterality Date   CESAREAN SECTION N/A 09/12/2015   Procedure: CESAREAN SECTION;  Surgeon: Florian Buff, MD;  Location: [facility];  Service: Obstetrics;  Laterality: N/A;   CESAREAN SECTION N/A 03/08/2020   Procedure: CESAREAN SECTION;  Surgeon: Donnamae Jude, MD;  Location: [facility];  Service: Obstetrics;  Laterality: N/A;   WISDOM TOOTH EXTRACTION  04/2011    Current Outpatient Medications  Medication Sig Dispense Refill   Blood Pressure Monitor MISC For regular home bp monitoring during pregnancy 1 each 0   LO LOESTRIN FE 1 MG-10 MCG / 10 MCG tablet Take 1 tablet by mouth daily. 90 tablet 3   ibuprofen (ADVIL) 800 MG tablet Take 1 tablet (800 mg total) by mouth every 6 (six) hours. (Patient not taking: No sig reported) 30 tablet 0   NIFEdipine (ADALAT  CC) 30 MG 24 hr tablet Take 1 tablet (30 mg total) by mouth daily. (Patient not taking: No sig reported) 30 tablet 1   ondansetron (ZOFRAN) 4 MG tablet Take 1 tablet (4 mg total) by mouth every 6 (six) hours. (Patient not taking: Reported on 01/25/2021) 12 tablet 0   Prenatal Vit-Fe Fumarate-FA (MULTIVITAMIN-PRENATAL) 27-0.8 MG TABS tablet Take 1 tablet by mouth daily at 12 noon.  (Patient not taking: Reported on 01/25/2021)     No current facility-administered medications for this visit.    Allergies as of 01/25/2021   (No Known Allergies)    Family History  Problem Relation Age of Onset   Hypertension Mother    Hyperlipidemia Mother    Polycystic ovary syndrome Sister    Diabetes Maternal Grandmother    Thyroid disease Maternal Grandmother    Diabetes Maternal Grandfather    Breast cancer Paternal Grandmother    Breast cancer Maternal Aunt 52   Cancer Maternal Aunt 51       breast   Other Maternal Aunt        Platelet grandular defect   Cancer Paternal Aunt        breast cancer    Social History   Socioeconomic History   Marital status: Married    Spouse name: Not on file   Number of children: Not on file   Years of education: Not  on file   Highest education level: Not on file  Occupational History   Not on file  Tobacco Use   Smoking status: Never   Smokeless tobacco: Never  Vaping Use   Vaping Use: Never used  Substance and Sexual Activity   Alcohol use: No   Drug use: No   Sexual activity: Not Currently    Partners: Male    Birth control/protection: None, Pill  Other Topics Concern   Not on file  Social History Narrative   Not on file   Social Determinants of Health   Financial Resource Strain: Not on file  Food Insecurity: No Food Insecurity   Worried About Running Out of Food in the Last Year: Never true   Ran Out of Food in the Last Year: Never true  Transportation Needs: No Transportation Needs   Lack of Transportation (Medical): No   Lack of  Transportation (Non-Medical): No  Physical Activity: Not on file  Stress: Not on file  Social Connections: Not on file  Intimate Partner Violence: Not on file    Subjective: Review of Systems  Constitutional:  Negative for chills and fever.  HENT:  Negative for congestion and hearing loss.   Eyes:  Negative for blurred vision and double vision.  Respiratory:  Negative for cough and shortness of breath.   Cardiovascular:  Negative for chest pain and palpitations.  Gastrointestinal:  Positive for abdominal pain and nausea. Negative for blood in stool, constipation, diarrhea, heartburn, melena and vomiting.  Genitourinary:  Negative for dysuria and urgency.  Musculoskeletal:  Negative for joint pain and myalgias.  Skin:  Negative for itching and rash.  Neurological:  Negative for dizziness and headaches.  Psychiatric/Behavioral:  Negative for depression. The patient is not nervous/anxious.       Objective: BP (!) 146/97   Pulse 87   Temp 97.8 F (36.6 C) (Temporal)   Ht _0  (1.6 m)   Wt 234 lb 12.8 oz (106.5 kg)   LMP 01/20/2021 (Exact Date)   BMI 41.59 kg/m  Physical Exam Constitutional:      Appearance: Normal appearance.  HENT:     Head: Normocephalic and atraumatic.  Eyes:     Extraocular Movements: Extraocular movements intact.     Conjunctiva/sclera: Conjunctivae normal.  Cardiovascular:     Rate and Rhythm: Normal rate and regular rhythm.  Pulmonary:     Effort: Pulmonary effort is normal.     Breath sounds: Normal breath sounds.  Abdominal:     General: Bowel sounds are normal.     Palpations: Abdomen is soft.  Musculoskeletal:        General: No swelling. Normal range of motion.     Cervical back: Normal range of motion and neck supple.  Skin:    General: Skin is warm and dry.     Coloration: Skin is not jaundiced.  Neurological:     General: No focal deficit present.     Mental Status: She is alert and oriented to person, place, and time.   Psychiatric:        Mood and Affect: Mood normal.        Behavior: Behavior normal.     Assessment: *Right upper quadrant abdominal pain *Abnormal aminotransferases  Plan: Discussed patient's symptoms in depth with her today.  We will order right upper quadrant ultrasound to evaluate for biliary colic.  If this is negative, we may need to consider HIDA scan.    If both of these are negative  then we we will consider further evaluation with upper endoscopy to rule out other causes of pain including peptic ulcer disease, esophagitis, gastritis, H. Pylori, duodenitis, or other.   I will recheck patient's LFTs today.  We may need to consider full serological work-up depending on her clinical course.  Further recommendations to follow.  Thank you Faustino Congress for the kind referral.  01/25/2021 3:20 PM   Disclaimer: This note was dictated with voice recognition software. Similar sounding words can inadvertently be transcribed and may not be corrected upon review.

## 2021-01-25 NOTE — Progress Notes (Deleted)
Primary Care Physician:  Pcp, No Primary Gastroenterologist:  Dr. Marletta Lor  Chief Complaint  Patient presents with   Abdominal Pain    RUQ abd pain under rib cage, vomiting    HPI:   Vanessa Rice is a 35 y.o. female who presents   Past Medical History:  Diagnosis Date   Acute blood loss anemia 09/13/2015   Bronchial spasms    Gestational diabetes 01/12/2020   Mild preeclampsia 09/09/2015    Past Surgical History:  Procedure Laterality Date   CESAREAN SECTION N/A 09/12/2015   Procedure: CESAREAN SECTION;  Surgeon: [name], MD;  Location: [facility];  Service: Obstetrics;  Laterality: N/A;   CESAREAN SECTION N/A 03/08/2020   Procedure: CESAREAN SECTION;  Surgeon: Reva Bores, MD;  Location: [facility];  Service: Obstetrics;  Laterality: N/A;   WISDOM TOOTH EXTRACTION  04/2011    Current Outpatient Medications  Medication Sig Dispense Refill   Blood Pressure Monitor MISC For regular home bp monitoring during pregnancy 1 each 0   LO LOESTRIN FE 1 MG-10 MCG / 10 MCG tablet Take 1 tablet by mouth daily. 90 tablet 3   ibuprofen (ADVIL) 800 MG tablet Take 1 tablet (800 mg total) by mouth every 6 (six) hours. (Patient not taking: No sig reported) 30 tablet 0   NIFEdipine (ADALAT CC) 30 MG 24 hr tablet Take 1 tablet (30 mg total) by mouth daily. (Patient not taking: No sig reported) 30 tablet 1   ondansetron (ZOFRAN) 4 MG tablet Take 1 tablet (4 mg total) by mouth every 6 (six) hours. (Patient not taking: Reported on 01/25/2021) 12 tablet 0   Prenatal Vit-Fe Fumarate-FA (MULTIVITAMIN-PRENATAL) 27-0.8 MG TABS tablet Take 1 tablet by mouth daily at 12 noon.  (Patient not taking: Reported on 01/25/2021)     No current facility-administered medications for this visit.    Allergies as of 01/25/2021   (No Known Allergies)    Family History  Problem Relation Age of Onset   Hypertension Mother    Hyperlipidemia Mother    Polycystic ovary syndrome Sister    Diabetes Maternal  Grandmother    Thyroid disease Maternal Grandmother    Diabetes Maternal Grandfather    Breast cancer Paternal Grandmother    Breast cancer Maternal Aunt 87   Cancer Maternal Aunt 51       breast   Other Maternal Aunt        Platelet grandular defect   Cancer Paternal Aunt        breast cancer    Social History   Socioeconomic History   Marital status: Married    Spouse name: Not on file   Number of children: Not on file   Years of education: Not on file   Highest education level: Not on file  Occupational History   Not on file  Tobacco Use   Smoking status: Never   Smokeless tobacco: Never  Vaping Use   Vaping Use: Never used  Substance and Sexual Activity   Alcohol use: No   Drug use: No   Sexual activity: Not Currently    Partners: Male    Birth control/protection: None, Pill  Other Topics Concern   Not on file  Social History Narrative   Not on file   Social Determinants of Health   Financial Resource Strain: Not on file  Food Insecurity: No Food Insecurity   Worried About Running Out of Food in the Last Year: Never true  Ran Out of Food in the Last Year: Never true  Transportation Needs: No Transportation Needs   Lack of Transportation (Medical): No   Lack of Transportation (Non-Medical): No  Physical Activity: Not on file  Stress: Not on file  Social Connections: Not on file  Intimate Partner Violence: Not on file    Subjective: ROS     Objective: BP (!) 146/97   Pulse 87   Temp 97.8 F (36.6 C) (Temporal)   Ht 5\' 3"  (1.6 m)   Wt 234 lb 12.8 oz (106.5 kg)   LMP 01/20/2021 (Exact Date)   BMI 41.59 kg/m  Physical Exam   Assessment: *  Plan:   01/25/2021 3:33 PM   Disclaimer: This note was dictated with voice recognition software. Similar sounding words can inadvertently be transcribed and may not be corrected upon review.

## 2021-01-25 NOTE — Patient Instructions (Signed)
We will schedule you for right upper quadrant ultrasound to further evaluate your gallbladder as a cause of your abdominal pain.  I will also recheck your liver function test today at [facility].    Depending on findings we may need to consider a HIDA scan in the future.  If both of these are negative then we may need to consider upper endoscopy to evaluate your esophagus, stomach, small bowel for other causes of pain.  Further recommendations to follow.  It was nice meeting you today.  - Dr. Salena Saner  At [facility] we value your feedback. You may receive a survey about your visit today. Please share your experience as we strive to create trusting relationships with our patients to provide genuine, compassionate, quality care.  We appreciate your understanding and patience as we review any laboratory studies, imaging, and other diagnostic tests that are ordered as we care for you. Our office policy is 5 business days for review of these results, and any emergent or urgent results are addressed in a timely manner for your best interest. If you do not hear from our office in 1 week, please contact us.   We also encourage the use of MyChart, which contains your medical information for your review as well. If you are not enrolled in this feature, an access code is on this after visit summary for your convenience. Thank you for allowing [place] to be involved in your care.  It was great to see you today!  I hope you have a great rest of your summer!!    Hennie Duos. Marletta Lor, D.O. Gastroenterology and Hepatology [facility]

## 2021-01-26 LAB — HEPATIC FUNCTION PANEL
ALT: 11 IU/L (ref 0–32)
AST: 9 IU/L (ref 0–40)
Albumin: 3.9 g/dL (ref 3.8–4.8)
Alkaline Phosphatase: 71 IU/L (ref 44–121)
Bilirubin Total: 0.2 mg/dL (ref 0.0–1.2)
Bilirubin, Direct: <0.1 mg/dL (ref 0.00–0.40)
Total Protein: 6.9 g/dL (ref 6.0–8.5)

## 2021-01-30 ENCOUNTER — Other Ambulatory Visit: Payer: Self-pay

## 2021-01-30 ENCOUNTER — Ambulatory Visit (HOSPITAL_COMMUNITY)
Admission: RE | Admit: 2021-01-30 | Discharge: 2021-01-30 | Disposition: A | Discharge status: Home / Self Care | Payer: BC Managed Care – PPO | Source: Ambulatory Visit | Attending: Internal Medicine | Admitting: Internal Medicine

## 2021-01-30 DIAGNOSIS — R945 Abnormal results of liver function studies: Secondary | ICD-10-CM | POA: Diagnosis not present

## 2021-01-30 DIAGNOSIS — R110 Nausea: Secondary | ICD-10-CM | POA: Insufficient documentation

## 2021-01-30 DIAGNOSIS — K8020 Calculus of gallbladder without cholecystitis without obstruction: Secondary | ICD-10-CM | POA: Diagnosis not present

## 2021-01-30 DIAGNOSIS — R1011 Right upper quadrant pain: Secondary | ICD-10-CM | POA: Insufficient documentation

## 2021-01-30 DIAGNOSIS — R7989 Other specified abnormal findings of blood chemistry: Secondary | ICD-10-CM

## 2021-02-06 ENCOUNTER — Telehealth: Payer: Self-pay | Admitting: Internal Medicine

## 2021-02-06 NOTE — Telephone Encounter (Signed)
Pt had [place] last Monday and was checking if the results were available, Please call [phone]

## 2021-02-08 NOTE — Telephone Encounter (Signed)
Returned the pt's call and advised her that Dr. Marletta Lor has not read her results but he is in the office today and no sooner than he sends them to [place] I would give her a call. Pt expressed understanding.

## 2021-02-09 ENCOUNTER — Other Ambulatory Visit: Payer: Self-pay

## 2021-02-09 DIAGNOSIS — R1011 Right upper quadrant pain: Secondary | ICD-10-CM

## 2021-02-09 DIAGNOSIS — K8020 Calculus of gallbladder without cholecystitis without obstruction: Secondary | ICD-10-CM

## 2021-02-09 NOTE — Progress Notes (Signed)
Br

## 2021-02-23 ENCOUNTER — Ambulatory Visit: Payer: BC Managed Care – PPO | Admitting: General Surgery

## 2021-02-23 ENCOUNTER — Encounter: Payer: Self-pay | Admitting: General Surgery

## 2021-02-23 ENCOUNTER — Other Ambulatory Visit: Payer: Self-pay

## 2021-02-23 VITALS — BP 120/82 | HR 82 | Temp 98.2°F | Resp 14 | Ht 63.0 in | Wt 234.0 lb

## 2021-02-23 DIAGNOSIS — K8020 Calculus of gallbladder without cholecystitis without obstruction: Secondary | ICD-10-CM

## 2021-02-24 NOTE — Progress Notes (Signed)
Vanessa Rice; [id number]; Dec 04, 1985   HPI Patient is a 35 year old white female who was referred to my care by Dr. Marletta Lor of GI for evaluation and treatment of cholelithiasis.  Patient was noted recently to have elevated liver enzyme test with a normal total bilirubin.  She states she has had multiple episodes of right upper quadrant abdominal pain with nausea and vomiting for many months.  The frequency seems to be worsening.  She denies any fever, chills, or jaundice.  It is sometimes associated with food.  It does occur soon after eating.  An ultrasound the gallbladder was performed which revealed cholelithiasis with a normal common bile duct.  She has had a repeat of her liver enzyme tests which have normalized. Past Medical History:  Diagnosis Date   Acute blood loss anemia 09/13/2015   Bronchial spasms    Gestational diabetes 01/12/2020   Mild preeclampsia 09/09/2015    Past Surgical History:  Procedure Laterality Date   CESAREAN SECTION N/A 09/12/2015   Procedure: CESAREAN SECTION;  Surgeon: [name], MD;  Location: [facility];  Service: Obstetrics;  Laterality: N/A;   CESAREAN SECTION N/A 03/08/2020   Procedure: CESAREAN SECTION;  Surgeon: Reva Bores, MD;  Location: [facility];  Service: Obstetrics;  Laterality: N/A;   WISDOM TOOTH EXTRACTION  04/2011    Family History  Problem Relation Age of Onset   Hypertension Mother    Hyperlipidemia Mother    Polycystic ovary syndrome Sister    Diabetes Maternal Grandmother    Thyroid disease Maternal Grandmother    Diabetes Maternal Grandfather    Breast cancer Paternal Grandmother    Breast cancer Maternal Aunt 52   Cancer Maternal Aunt 51       breast   Other Maternal Aunt        Platelet grandular defect   Cancer Paternal Aunt        breast cancer    Current Outpatient Medications on File Prior to Visit  Medication Sig Dispense Refill   LO LOESTRIN FE 1 MG-10 MCG / 10 MCG tablet Take 1 tablet by mouth daily. 90 tablet 3    No current facility-administered medications on file prior to visit.    No Known Allergies  Social History   Substance and Sexual Activity  Alcohol Use No    Social History   Tobacco Use  Smoking Status Never  Smokeless Tobacco Never    Review of Systems  Constitutional: Negative.   HENT: Negative.    Eyes: Negative.   Respiratory: Negative.    Cardiovascular: Negative.   Gastrointestinal:  Positive for abdominal pain and heartburn.  Genitourinary: Negative.   Musculoskeletal: Negative.   Skin: Negative.   Neurological: Negative.   Endo/Heme/Allergies: Negative.   Psychiatric/Behavioral: Negative.     Objective   Vitals:   02/23/21 0905  BP: 120/82  Pulse: 82  Resp: 14  Temp: 98.2 F (36.8 C)  SpO2: 99%    Physical Exam Vitals reviewed.  Constitutional:      Appearance: Normal appearance. She is obese. She is not ill-appearing.  HENT:     Head: Normocephalic and atraumatic.  Cardiovascular:     Rate and Rhythm: Normal rate and regular rhythm.     Heart sounds: Normal heart sounds. No murmur heard.   No gallop.  Pulmonary:     Effort: Pulmonary effort is normal. No respiratory distress.     Breath sounds: Normal breath sounds. No stridor. No wheezing, rhonchi or rales.  Abdominal:     General: Bowel sounds are normal. There is no distension.     Palpations: Abdomen is soft. There is no mass.     Tenderness: There is no abdominal tenderness. There is no guarding or rebound.     Hernia: No hernia is present.  Skin:    General: Skin is warm and dry.  Neurological:     Mental Status: She is alert and oriented to person, place, and time.   Dr. Carver's notes reviewed.  Labs and ultrasound report reviewed Assessment  Biliary colic, cholelithiasis Plan  Patient is scheduled for laparoscopic cholecystectomy on 03/13/2021.  The risks and benefits of the procedure including bleeding, infection, hepatobiliary injury, and the possibility of an open  procedure were fully explained to the patient, who gave informed consent.  

## 2021-02-24 NOTE — H&P (Signed)
Vanessa Rice; [id number]; Dec 04, 1985   HPI Patient is a 35 year old white female who was referred to my care by Dr. Marletta Lor of GI for evaluation and treatment of cholelithiasis.  Patient was noted recently to have elevated liver enzyme test with a normal total bilirubin.  She states she has had multiple episodes of right upper quadrant abdominal pain with nausea and vomiting for many months.  The frequency seems to be worsening.  She denies any fever, chills, or jaundice.  It is sometimes associated with food.  It does occur soon after eating.  An ultrasound the gallbladder was performed which revealed cholelithiasis with a normal common bile duct.  She has had a repeat of her liver enzyme tests which have normalized. Past Medical History:  Diagnosis Date   Acute blood loss anemia 09/13/2015   Bronchial spasms    Gestational diabetes 01/12/2020   Mild preeclampsia 09/09/2015    Past Surgical History:  Procedure Laterality Date   CESAREAN SECTION N/A 09/12/2015   Procedure: CESAREAN SECTION;  Surgeon: [name], MD;  Location: [facility];  Service: Obstetrics;  Laterality: N/A;   CESAREAN SECTION N/A 03/08/2020   Procedure: CESAREAN SECTION;  Surgeon: Reva Bores, MD;  Location: [facility];  Service: Obstetrics;  Laterality: N/A;   WISDOM TOOTH EXTRACTION  04/2011    Family History  Problem Relation Age of Onset   Hypertension Mother    Hyperlipidemia Mother    Polycystic ovary syndrome Sister    Diabetes Maternal Grandmother    Thyroid disease Maternal Grandmother    Diabetes Maternal Grandfather    Breast cancer Paternal Grandmother    Breast cancer Maternal Aunt 52   Cancer Maternal Aunt 51       breast   Other Maternal Aunt        Platelet grandular defect   Cancer Paternal Aunt        breast cancer    Current Outpatient Medications on File Prior to Visit  Medication Sig Dispense Refill   LO LOESTRIN FE 1 MG-10 MCG / 10 MCG tablet Take 1 tablet by mouth daily. 90 tablet 3    No current facility-administered medications on file prior to visit.    No Known Allergies  Social History   Substance and Sexual Activity  Alcohol Use No    Social History   Tobacco Use  Smoking Status Never  Smokeless Tobacco Never    Review of Systems  Constitutional: Negative.   HENT: Negative.    Eyes: Negative.   Respiratory: Negative.    Cardiovascular: Negative.   Gastrointestinal:  Positive for abdominal pain and heartburn.  Genitourinary: Negative.   Musculoskeletal: Negative.   Skin: Negative.   Neurological: Negative.   Endo/Heme/Allergies: Negative.   Psychiatric/Behavioral: Negative.     Objective   Vitals:   02/23/21 0905  BP: 120/82  Pulse: 82  Resp: 14  Temp: 98.2 F (36.8 C)  SpO2: 99%    Physical Exam Vitals reviewed.  Constitutional:      Appearance: Normal appearance. She is obese. She is not ill-appearing.  HENT:     Head: Normocephalic and atraumatic.  Cardiovascular:     Rate and Rhythm: Normal rate and regular rhythm.     Heart sounds: Normal heart sounds. No murmur heard.   No gallop.  Pulmonary:     Effort: Pulmonary effort is normal. No respiratory distress.     Breath sounds: Normal breath sounds. No stridor. No wheezing, rhonchi or rales.  Abdominal:     General: Bowel sounds are normal. There is no distension.     Palpations: Abdomen is soft. There is no mass.     Tenderness: There is no abdominal tenderness. There is no guarding or rebound.     Hernia: No hernia is present.  Skin:    General: Skin is warm and dry.  Neurological:     Mental Status: She is alert and oriented to person, place, and time.   Dr. Queen Blossom notes reviewed.  Labs and ultrasound report reviewed Assessment  Biliary colic, cholelithiasis Plan  Patient is scheduled for laparoscopic cholecystectomy on 03/13/2021.  The risks and benefits of the procedure including bleeding, infection, hepatobiliary injury, and the possibility of an open  procedure were fully explained to the patient, who gave informed consent.

## 2021-03-08 NOTE — Patient Instructions (Signed)
Vanessa Rice  03/08/2021     @PREFPERIOPPHARMACY @   Your procedure is scheduled on  03/13/2021.   Report to 05/13/2021 at  0700  A.M.   Call this number if you have problems the morning of surgery:  [phone]   Remember:  Do not eat or drink after midnight.      Take these medicines the morning of surgery with A SIP OF WATER                                        None     Do not wear jewelry, make-up or nail polish.  Do not wear lotions, powders, or perfumes, or deodorant.  Do not shave 48 hours prior to surgery.  Men may shave face and neck.  Do not bring valuables to the hospital.  [facility] is not responsible for any belongings or valuables.  Contacts, dentures or bridgework may not be worn into surgery.  Leave your suitcase in the car.  After surgery it may be brought to your room.  For patients admitted to the hospital, discharge time will be determined by your treatment team.  Patients discharged the day of surgery will not be allowed to drive home and must have someone with them for 24 hours.   Special instructions:   DO NOT smoke tobacco or vape for 24 hours before your procedure.  Please read over the following fact sheets that you were given. Coughing and Deep Breathing, Surgical Site Infection Prevention, Anesthesia Post-op Instructions, and Care and Recovery After Surgery      Minimally Invasive Cholecystectomy, Care After This sheet gives you information about how to care for yourself after your procedure. Your health care provider may also give you more specific instructions. If you have problems or questions, contact your health care provider. What can I expect after the procedure? After the procedure, it is common to have: Pain at your incision sites. You will be given medicines to control this pain. Mild nausea or vomiting. Bloating and possible shoulder pain from the gas that was used during the procedure. Follow these  instructions at home: Medicines Take over-the-counter and prescription medicines only as told by your health care provider. If you were prescribed an antibiotic medicine, take or use it as told by your health care provider. Do not stop using the antibiotic even if you start to feel better. Ask your health care provider if the medicine prescribed to you: Requires you to avoid driving or using machinery. Can cause constipation. You may need to take these actions to prevent or treat constipation: Drink enough fluid to keep your urine pale yellow. Take over-the-counter or prescription medicines. Eat foods that are high in fiber, such as beans, whole grains, and fresh fruits and vegetables. Limit foods that are high in fat and processed sugars, such as fried or sweet foods. Incision care  Follow instructions from your health care provider about how to take care of your incisions. Make sure you: Wash your hands with soap and water for at least 20 seconds before and after you change your bandage (dressing). If soap and water are not available, use hand sanitizer. Change your dressing as told by your health care provider. Leave stitches (sutures), skin glue, or adhesive strips in place. These skin closures may need to be in place for 2 weeks  or longer. If adhesive strip edges start to loosen and curl up, you may trim the loose edges. Do not remove adhesive strips completely unless your health care provider tells you to do that. Do not take baths, swim, or use a hot tub until your health care provider approves. Ask your health care provider if you may take showers. You may only be allowed to take sponge baths. Check your incision area every day for signs of infection. Check for: More redness, swelling, or pain. Fluid or blood. Warmth. Pus or a bad smell. Activity Rest as told by your health care provider. Avoid sitting for a long time without moving. Get up to take short walks every 1-2 hours. This  is important to improve blood flow and breathing. Ask for help if you feel weak or unsteady. Do not lift anything that is heavier than 10 lb (4.5 kg), or the limit that you are told, until your health care provider says that it is safe. Do not play contact sports until your health care provider approves. Do not return to work or school until your health care provider approves. Return to your normal activities as told by your health care provider. Ask your health care provider what activities are safe for you. General instructions If you were given a sedative during the procedure, it can affect you for several hours. Do not drive or operate machinery until your health care provider says that it is safe. Keep all follow-up visits as told by your health care provider. This is important. Contact a health care provider if: You develop a rash. You have more redness, swelling, or pain around your incisions. You have fluid or blood coming from your incisions. Your incisions feel warm to the touch. You have pus or a bad smell coming from your incisions. You have a fever. One or more of your incisions breaks open. Get help right away if: You have trouble breathing. You have chest pain. You have increasing pain in your shoulders. You faint or feel dizzy when you stand. You have severe pain in your abdomen. You have nausea or vomiting that lasts for more than one day. You have leg pain. Summary After your procedure, it is common to have pain at the incision sites. You may also have nausea or bloating. Follow your health care provider's instructions about medicine, activity restrictions, and caring for your incision areas. Do not do activities that require a lot of effort. Contact a health care provider if you have a fever or other signs of infection, such as more redness, swelling, or pain around the incisions. Get help right away if you have chest pain, increasing pain in the shoulders, or trouble  breathing. This information is not intended to replace advice given to you by your health care provider. Make sure you discuss any questions you have with your health care provider. Document Revised: 03/18/2019 Document Reviewed: 03/18/2019 Elsevier Patient Education  2022 [employer] General Anesthesia, Adult, Care After This sheet gives you information about how to care for yourself after your procedure. Your health care provider may also give you more specific instructions. If you have problems or questions, contact your health care provider. What can I expect after the procedure? After the procedure, the following side effects are common: Pain or discomfort at the IV site. Nausea. Vomiting. Sore throat. Trouble concentrating. Feeling cold or chills. Feeling weak or tired. Sleepiness and fatigue. Soreness and body aches. These side effects can affect parts of the body  that were not involved in surgery. Follow these instructions at home: For the time period you were told by your health care provider:  Rest. Do not participate in activities where you could fall or become injured. Do not drive or use machinery. Do not drink alcohol. Do not take sleeping pills or medicines that cause drowsiness. Do not make important decisions or sign legal documents. Do not take care of children on your own. Eating and drinking Follow any instructions from your health care provider about eating or drinking restrictions. When you feel hungry, start by eating small amounts of foods that are soft and easy to digest (bland), such as toast. Gradually return to your regular diet. Drink enough fluid to keep your urine pale yellow. If you vomit, rehydrate by drinking water, juice, or clear broth. General instructions If you have sleep apnea, surgery and certain medicines can increase your risk for breathing problems. Follow instructions from your health care provider about wearing your sleep  device: Anytime you are sleeping, including during daytime naps. While taking prescription pain medicines, sleeping medicines, or medicines that make you drowsy. Have a responsible adult stay with you for the time you are told. It is important to have someone help care for you until you are awake and alert. Return to your normal activities as told by your health care provider. Ask your health care provider what activities are safe for you. Take over-the-counter and prescription medicines only as told by your health care provider. If you smoke, do not smoke without supervision. Keep all follow-up visits as told by your health care provider. This is important. Contact a health care provider if: You have nausea or vomiting that does not get better with medicine. You cannot eat or drink without vomiting. You have pain that does not get better with medicine. You are unable to pass urine. You develop a skin rash. You have a fever. You have redness around your IV site that gets worse. Get help right away if: You have difficulty breathing. You have chest pain. You have blood in your urine or stool, or you vomit blood. Summary After the procedure, it is common to have a sore throat or nausea. It is also common to feel tired. Have a responsible adult stay with you for the time you are told. It is important to have someone help care for you until you are awake and alert. When you feel hungry, start by eating small amounts of foods that are soft and easy to digest (bland), such as toast. Gradually return to your regular diet. Drink enough fluid to keep your urine pale yellow. Return to your normal activities as told by your health care provider. Ask your health care provider what activities are safe for you. This information is not intended to replace advice given to you by your health care provider. Make sure you discuss any questions you have with your health care provider. Document Revised:  03/03/2020 Document Reviewed: 10/01/2019 Elsevier Patient Education  2022 [employer] Inc[employer] How to Use Chlorhexidine for Bathing Chlorhexidine gluconate (CHG) is a germ-killing (antiseptic) solution that is used to clean the skin. It can get rid of the bacteria that normally live on the skin and can keep them away for about 24 hours. To clean your skin with CHG, you may be given: A CHG solution to use in the shower or as part of a sponge bath. A prepackaged cloth that contains CHG. Cleaning your skin with CHG may help lower the risk  for infection: While you are staying in the intensive care unit of the hospital. If you have a vascular access, such as a central line, to provide short-term or long-term access to your veins. If you have a catheter to drain urine from your bladder. If you are on a ventilator. A ventilator is a machine that helps you breathe by moving air in and out of your lungs. After surgery. What are the risks? Risks of using CHG include: A skin reaction. Hearing loss, if CHG gets in your ears and you have a perforated eardrum. Eye injury, if CHG gets in your eyes and is not rinsed out. The CHG product catching fire. Make sure that you avoid smoking and flames after applying CHG to your skin. Do not use CHG: If you have a chlorhexidine allergy or have previously reacted to chlorhexidine. On babies younger than 54 months of age. How to use CHG solution Use CHG only as told by your health care provider, and follow the instructions on the label. Use the full amount of CHG as directed. Usually, this is one bottle. During a shower Follow these steps when using CHG solution during a shower (unless your health care provider gives you different instructions): Start the shower. Use your normal soap and shampoo to wash your face and hair. Turn off the shower or move out of the shower stream. Pour the CHG onto a clean washcloth. Do not use any type of brush or rough-edged  sponge. Starting at your neck, lather your body down to your toes. Make sure you follow these instructions: If you will be having surgery, pay special attention to the part of your body where you will be having surgery. Scrub this area for at least 1 minute. Do not use CHG on your head or face. If the solution gets into your ears or eyes, rinse them well with water. Avoid your genital area. Avoid any areas of skin that have broken skin, cuts, or scrapes. Scrub your back and under your arms. Make sure to wash skin folds. Let the lather sit on your skin for 1-2 minutes or as long as told by your health care provider. Thoroughly rinse your entire body in the shower. Make sure that all body creases and crevices are rinsed well. Dry off with a clean towel. Do not put any substances on your body afterward--such as powder, lotion, or perfume--unless you are told to do so by your health care provider. Only use lotions that are recommended by the manufacturer. Put on clean clothes or pajamas. If it is the night before your surgery, sleep in clean sheets.  During a sponge bath Follow these steps when using CHG solution during a sponge bath (unless your health care provider gives you different instructions): Use your normal soap and shampoo to wash your face and hair. Pour the CHG onto a clean washcloth. Starting at your neck, lather your body down to your toes. Make sure you follow these instructions: If you will be having surgery, pay special attention to the part of your body where you will be having surgery. Scrub this area for at least 1 minute. Do not use CHG on your head or face. If the solution gets into your ears or eyes, rinse them well with water. Avoid your genital area. Avoid any areas of skin that have broken skin, cuts, or scrapes. Scrub your back and under your arms. Make sure to wash skin folds. Let the lather sit on your skin for 1-2  minutes or as long as told by your health care  provider. Using a different clean, wet washcloth, thoroughly rinse your entire body. Make sure that all body creases and crevices are rinsed well. Dry off with a clean towel. Do not put any substances on your body afterward--such as powder, lotion, or perfume--unless you are told to do so by your health care provider. Only use lotions that are recommended by the manufacturer. Put on clean clothes or pajamas. If it is the night before your surgery, sleep in clean sheets. How to use CHG prepackaged cloths Only use CHG cloths as told by your health care provider, and follow the instructions on the label. Use the CHG cloth on clean, dry skin. Do not use the CHG cloth on your head or face unless your health care provider tells you to. When washing with the CHG cloth: Avoid your genital area. Avoid any areas of skin that have broken skin, cuts, or scrapes. Before surgery Follow these steps when using a CHG cloth to clean before surgery (unless your health care provider gives you different instructions): Using the CHG cloth, vigorously scrub the part of your body where you will be having surgery. Scrub using a back-and-forth motion for 3 minutes. The area on your body should be completely wet with CHG when you are done scrubbing. Do not rinse. Discard the cloth and let the area air-dry. Do not put any substances on the area afterward, such as powder, lotion, or perfume. Put on clean clothes or pajamas. If it is the night before your surgery, sleep in clean sheets.  For general bathing Follow these steps when using CHG cloths for general bathing (unless your health care provider gives you different instructions). Use a separate CHG cloth for each area of your body. Make sure you wash between any folds of skin and between your fingers and toes. Wash your body in the following order, switching to a new cloth after each step: The front of your neck, shoulders, and chest. Both of your arms, under your  arms, and your hands. Your stomach and groin area, avoiding the genitals. Your right leg and foot. Your left leg and foot. The back of your neck, your back, and your buttocks. Do not rinse. Discard the cloth and let the area air-dry. Do not put any substances on your body afterward--such as powder, lotion, or perfume--unless you are told to do so by your health care provider. Only use lotions that are recommended by the manufacturer. Put on clean clothes or pajamas. Contact a health care provider if: Your skin gets irritated after scrubbing. You have questions about using your solution or cloth. You swallow any chlorhexidine. Call your local poison control center (6291583417 in the [place] Get help right away if: Your eyes itch badly, or they become very red or swollen. Your skin itches badly and is red or swollen. Your hearing changes. You have trouble seeing. You have swelling or tingling in your mouth or throat. You have trouble breathing. These symptoms may represent a serious problem that is an emergency. Do not wait to see if the symptoms will go away. Get medical help right away. Call your local emergency services (911 in the [place] Do not drive yourself to the hospital. Summary Chlorhexidine gluconate (CHG) is a germ-killing (antiseptic) solution that is used to clean the skin. Cleaning your skin with CHG may help to lower your risk for infection. You may be given CHG to use for bathing. It may be  in a bottle or in a prepackaged cloth to use on your skin. Carefully follow your health care provider's instructions and the instructions on the product label. Do not use CHG if you have a chlorhexidine allergy. Contact your health care provider if your skin gets irritated after scrubbing. This information is not intended to replace advice given to you by your health care provider. Make sure you discuss any questions you have with your health care provider. Document Revised: 08/29/2020  Document Reviewed: 08/29/2020 Elsevier Patient Education  2022 [employer].

## 2021-03-10 ENCOUNTER — Other Ambulatory Visit: Payer: Self-pay

## 2021-03-10 ENCOUNTER — Encounter (HOSPITAL_COMMUNITY): Admission: RE | Admit: 2021-03-10 | Payer: BC Managed Care – PPO | Source: Ambulatory Visit

## 2021-03-10 ENCOUNTER — Encounter (HOSPITAL_COMMUNITY)
Admission: RE | Admit: 2021-03-10 | Discharge: 2021-03-10 | Disposition: A | Discharge status: Home / Self Care | Payer: BC Managed Care – PPO | Source: Ambulatory Visit | Attending: General Surgery | Admitting: General Surgery

## 2021-03-10 DIAGNOSIS — Z79899 Other long term (current) drug therapy: Secondary | ICD-10-CM | POA: Diagnosis not present

## 2021-03-10 DIAGNOSIS — Z01812 Encounter for preprocedural laboratory examination: Secondary | ICD-10-CM | POA: Insufficient documentation

## 2021-03-10 DIAGNOSIS — Z842 Family history of other diseases of the genitourinary system: Secondary | ICD-10-CM | POA: Diagnosis not present

## 2021-03-10 DIAGNOSIS — Z803 Family history of malignant neoplasm of breast: Secondary | ICD-10-CM | POA: Diagnosis not present

## 2021-03-10 DIAGNOSIS — K8010 Calculus of gallbladder with chronic cholecystitis without obstruction: Secondary | ICD-10-CM | POA: Diagnosis not present

## 2021-03-10 DIAGNOSIS — Z6841 Body Mass Index (BMI) 40.0 and over, adult: Secondary | ICD-10-CM | POA: Diagnosis not present

## 2021-03-10 DIAGNOSIS — Z833 Family history of diabetes mellitus: Secondary | ICD-10-CM | POA: Diagnosis not present

## 2021-03-10 DIAGNOSIS — Z8249 Family history of ischemic heart disease and other diseases of the circulatory system: Secondary | ICD-10-CM | POA: Diagnosis not present

## 2021-03-10 DIAGNOSIS — Z8349 Family history of other endocrine, nutritional and metabolic diseases: Secondary | ICD-10-CM | POA: Diagnosis not present

## 2021-03-10 LAB — CBC WITH DIFFERENTIAL/PLATELET
Abs Immature Granulocytes: 0.04 10*3/uL (ref 0.00–0.07)
Basophils Absolute: 0 10*3/uL (ref 0.0–0.1)
Basophils Relative: 0 %
Eosinophils Absolute: 0.1 10*3/uL (ref 0.0–0.5)
Eosinophils Relative: 1 %
HCT: 41.4 % (ref 36.0–46.0)
Hemoglobin: 13.5 g/dL (ref 12.0–15.0)
Immature Granulocytes: 0 %
Lymphocytes Relative: 22 %
Lymphs Abs: 2.2 10*3/uL (ref 0.7–4.0)
MCH: 30.5 pg (ref 26.0–34.0)
MCHC: 32.6 g/dL (ref 30.0–36.0)
MCV: 93.5 fL (ref 80.0–100.0)
Monocytes Absolute: 0.5 10*3/uL (ref 0.1–1.0)
Monocytes Relative: 5 %
Neutro Abs: 6.9 10*3/uL (ref 1.7–7.7)
Neutrophils Relative %: 72 %
Platelets: 290 10*3/uL (ref 150–400)
RBC: 4.43 MIL/uL (ref 3.87–5.11)
RDW: 13.4 % (ref 11.5–15.5)
WBC: 9.8 10*3/uL (ref 4.0–10.5)
nRBC: 0 % (ref 0.0–0.2)

## 2021-03-10 LAB — HCG, SERUM, QUALITATIVE: Preg, Serum: NEGATIVE

## 2021-03-13 ENCOUNTER — Encounter (HOSPITAL_COMMUNITY)
Admission: RE | Disposition: A | Discharge status: Home / Self Care | Payer: Self-pay | Source: Ambulatory Visit | Attending: General Surgery

## 2021-03-13 ENCOUNTER — Ambulatory Visit (HOSPITAL_COMMUNITY): Payer: BC Managed Care – PPO | Admitting: Anesthesiology

## 2021-03-13 ENCOUNTER — Encounter (HOSPITAL_COMMUNITY): Payer: Self-pay | Admitting: General Surgery

## 2021-03-13 ENCOUNTER — Ambulatory Visit (HOSPITAL_COMMUNITY)
Admission: RE | Admit: 2021-03-13 | Discharge: 2021-03-13 | Disposition: A | Discharge status: Home / Self Care | Payer: BC Managed Care – PPO | Source: Ambulatory Visit | Attending: General Surgery | Admitting: General Surgery

## 2021-03-13 ENCOUNTER — Other Ambulatory Visit: Payer: Self-pay

## 2021-03-13 DIAGNOSIS — Z6841 Body Mass Index (BMI) 40.0 and over, adult: Secondary | ICD-10-CM | POA: Diagnosis not present

## 2021-03-13 DIAGNOSIS — Z8249 Family history of ischemic heart disease and other diseases of the circulatory system: Secondary | ICD-10-CM | POA: Insufficient documentation

## 2021-03-13 DIAGNOSIS — Z833 Family history of diabetes mellitus: Secondary | ICD-10-CM | POA: Diagnosis not present

## 2021-03-13 DIAGNOSIS — K8020 Calculus of gallbladder without cholecystitis without obstruction: Secondary | ICD-10-CM

## 2021-03-13 DIAGNOSIS — K8010 Calculus of gallbladder with chronic cholecystitis without obstruction: Secondary | ICD-10-CM | POA: Diagnosis not present

## 2021-03-13 DIAGNOSIS — Z8349 Family history of other endocrine, nutritional and metabolic diseases: Secondary | ICD-10-CM | POA: Insufficient documentation

## 2021-03-13 DIAGNOSIS — Z842 Family history of other diseases of the genitourinary system: Secondary | ICD-10-CM | POA: Insufficient documentation

## 2021-03-13 DIAGNOSIS — Z803 Family history of malignant neoplasm of breast: Secondary | ICD-10-CM | POA: Diagnosis not present

## 2021-03-13 DIAGNOSIS — Z79899 Other long term (current) drug therapy: Secondary | ICD-10-CM | POA: Diagnosis not present

## 2021-03-13 DIAGNOSIS — K8070 Calculus of gallbladder and bile duct without cholecystitis without obstruction: Secondary | ICD-10-CM | POA: Diagnosis not present

## 2021-03-13 HISTORY — PX: CHOLECYSTECTOMY: SHX55

## 2021-03-13 SURGERY — LAPAROSCOPIC CHOLECYSTECTOMY
Anesthesia: General

## 2021-03-13 MED ORDER — FENTANYL CITRATE (PF) 100 MCG/2ML IJ SOLN
INTRAMUSCULAR | Status: AC
  Filled 2021-03-13: qty 2

## 2021-03-13 MED ORDER — SUGAMMADEX SODIUM 200 MG/2ML IV SOLN
INTRAVENOUS | Status: DC | PRN
Start: 2021-03-13 — End: 2021-03-13
  Administered 2021-03-13: 300 mg via INTRAVENOUS

## 2021-03-13 MED ORDER — PROPOFOL 10 MG/ML IV BOLUS
INTRAVENOUS | Status: AC
  Filled 2021-03-13: qty 20

## 2021-03-13 MED ORDER — MIDAZOLAM HCL 5 MG/5ML IJ SOLN
INTRAMUSCULAR | Status: DC | PRN
  Administered 2021-03-13: 2 mg via INTRAVENOUS

## 2021-03-13 MED ORDER — KETOROLAC TROMETHAMINE 30 MG/ML IJ SOLN
30.0000 mg | Freq: Once | INTRAMUSCULAR | Status: AC
  Administered 2021-03-13: 30 mg via INTRAVENOUS
  Filled 2021-03-13: qty 1

## 2021-03-13 MED ORDER — CHLORHEXIDINE GLUCONATE CLOTH 2 % EX PADS
6.0000 | MEDICATED_PAD | Freq: Once | CUTANEOUS | Status: AC
  Administered 2021-03-13: 6 via TOPICAL

## 2021-03-13 MED ORDER — ROCURONIUM 10MG/ML (10ML) SYRINGE FOR MEDFUSION PUMP - OPTIME
INTRAVENOUS | Status: DC | PRN
  Administered 2021-03-13: 50 mg via INTRAVENOUS

## 2021-03-13 MED ORDER — CHLORHEXIDINE GLUCONATE CLOTH 2 % EX PADS: 6.0000 | MEDICATED_PAD | Freq: Once | CUTANEOUS | Status: DC

## 2021-03-13 MED ORDER — CEFAZOLIN SODIUM-DEXTROSE 2-4 GM/100ML-% IV SOLN
2.0000 g | INTRAVENOUS | Status: AC
  Administered 2021-03-13: 2 g via INTRAVENOUS
  Filled 2021-03-13: qty 100

## 2021-03-13 MED ORDER — BUPIVACAINE LIPOSOME 1.3 % IJ SUSP
INTRAMUSCULAR | Status: DC | PRN
  Administered 2021-03-13: 20 mL

## 2021-03-13 MED ORDER — MIDAZOLAM HCL 2 MG/2ML IJ SOLN
INTRAMUSCULAR | Status: AC
  Filled 2021-03-13: qty 2

## 2021-03-13 MED ORDER — ONDANSETRON HCL 4 MG/2ML IJ SOLN
INTRAMUSCULAR | Status: DC | PRN
  Administered 2021-03-13: 4 mg via INTRAVENOUS

## 2021-03-13 MED ORDER — SODIUM CHLORIDE 0.9 % IR SOLN
Status: DC | PRN
  Administered 2021-03-13: 1000 mL

## 2021-03-13 MED ORDER — PROPOFOL 10 MG/ML IV BOLUS
INTRAVENOUS | Status: DC | PRN
  Administered 2021-03-13: 200 mg via INTRAVENOUS

## 2021-03-13 MED ORDER — LACTATED RINGERS IV SOLN: INTRAVENOUS | Status: DC

## 2021-03-13 MED ORDER — CHLORHEXIDINE GLUCONATE 0.12 % MT SOLN
15.0000 mL | Freq: Once | OROMUCOSAL | Status: AC
  Administered 2021-03-13: 15 mL via OROMUCOSAL

## 2021-03-13 MED ORDER — FENTANYL CITRATE (PF) 100 MCG/2ML IJ SOLN
INTRAMUSCULAR | Status: DC | PRN
  Administered 2021-03-13 (×4): 50 ug via INTRAVENOUS

## 2021-03-13 MED ORDER — LIDOCAINE HCL (CARDIAC) PF 50 MG/5ML IV SOSY
PREFILLED_SYRINGE | INTRAVENOUS | Status: DC | PRN
  Administered 2021-03-13: 100 mg via INTRAVENOUS

## 2021-03-13 MED ORDER — HYDROCODONE-ACETAMINOPHEN 5-325 MG PO TABS: 1.0000 | ORAL_TABLET | Freq: Four times a day (QID) | ORAL | 0 refills | Status: DC | PRN

## 2021-03-13 MED ORDER — BUPIVACAINE LIPOSOME 1.3 % IJ SUSP
INTRAMUSCULAR | Status: AC
  Filled 2021-03-13: qty 20

## 2021-03-13 MED ORDER — ORAL CARE MOUTH RINSE: 15.0000 mL | Freq: Once | OROMUCOSAL | Status: AC

## 2021-03-13 MED ORDER — SCOPOLAMINE 1 MG/3DAYS TD PT72
MEDICATED_PATCH | TRANSDERMAL | Status: AC
  Filled 2021-03-13: qty 1

## 2021-03-13 MED ORDER — FENTANYL CITRATE (PF) 250 MCG/5ML IJ SOLN
INTRAMUSCULAR | Status: AC
  Filled 2021-03-13: qty 5

## 2021-03-13 MED ORDER — HEMOSTATIC AGENTS (NO CHARGE) OPTIME
TOPICAL | Status: DC | PRN
  Administered 2021-03-13: 1 via TOPICAL

## 2021-03-13 MED ORDER — SCOPOLAMINE 1 MG/3DAYS TD PT72
1.0000 | MEDICATED_PATCH | Freq: Once | TRANSDERMAL | Status: DC
  Administered 2021-03-13: 1.5 mg via TRANSDERMAL

## 2021-03-13 SURGICAL SUPPLY — 42 items
ADH SKN CLS APL DERMABOND .7 (GAUZE/BANDAGES/DRESSINGS) ×1
APL PRP STRL LF DISP 70% ISPRP (MISCELLANEOUS) ×1
APL SRG 38 LTWT LNG FL B (MISCELLANEOUS)
APPLICATOR ARISTA FLEXITIP XL (MISCELLANEOUS) IMPLANT
APPLIER CLIP ROT 10 11.4 M/L (STAPLE) ×2
APR CLP MED LRG 11.4X10 (STAPLE) ×1
BAG RETRIEVAL 10 (BASKET) ×1
CHLORAPREP W/TINT 26 (MISCELLANEOUS) ×2 IMPLANT
CLIP APPLIE ROT 10 11.4 M/L (STAPLE) ×1 IMPLANT
CLOTH BEACON ORANGE TIMEOUT ST (SAFETY) ×2 IMPLANT
COVER LIGHT HANDLE STERIS (MISCELLANEOUS) ×4 IMPLANT
DERMABOND ADVANCED (GAUZE/BANDAGES/DRESSINGS) ×1
DERMABOND ADVANCED .7 DNX12 (GAUZE/BANDAGES/DRESSINGS) ×1 IMPLANT
ELECT REM PT RETURN 9FT ADLT (ELECTROSURGICAL) ×2
ELECTRODE REM PT RTRN 9FT ADLT (ELECTROSURGICAL) ×1 IMPLANT
GLOVE SURG POLYISO LF SZ7.5 (GLOVE) ×2 IMPLANT
GLOVE SURG UNDER POLY LF SZ7 (GLOVE) ×4 IMPLANT
GOWN STRL REUS W/TWL LRG LVL3 (GOWN DISPOSABLE) ×6 IMPLANT
HEMOSTAT ARISTA ABSORB 3G PWDR (HEMOSTASIS) IMPLANT
HEMOSTAT SNOW SURGICEL 2X4 (HEMOSTASIS) ×2 IMPLANT
INST SET LAPROSCOPIC AP (KITS) ×2 IMPLANT
KIT TURNOVER KIT A (KITS) ×2 IMPLANT
MANIFOLD NEPTUNE II (INSTRUMENTS) ×2 IMPLANT
NEEDLE HYPO 18GX1.5 BLUNT FILL (NEEDLE) ×2 IMPLANT
NEEDLE HYPO 21X1.5 SAFETY (NEEDLE) ×2 IMPLANT
NEEDLE INSUFFLATION 14GA 120MM (NEEDLE) ×2 IMPLANT
NS IRRIG 1000ML POUR BTL (IV SOLUTION) ×2 IMPLANT
PACK LAP CHOLE LZT030E (CUSTOM PROCEDURE TRAY) ×2 IMPLANT
PAD ARMBOARD 7.5X6 YLW CONV (MISCELLANEOUS) ×2 IMPLANT
SET BASIN LINEN APH (SET/KITS/TRAYS/PACK) ×2 IMPLANT
SET TUBE SMOKE EVAC HIGH FLOW (TUBING) ×2 IMPLANT
SLEEVE ENDOPATH XCEL 5M (ENDOMECHANICALS) ×2 IMPLANT
SUT MNCRL AB 4-0 PS2 18 (SUTURE) ×4 IMPLANT
SUT VICRYL 0 UR6 27IN ABS (SUTURE) ×2 IMPLANT
SYR 20ML LL LF (SYRINGE) ×4 IMPLANT
SYS BAG RETRIEVAL 10MM (BASKET) ×1
SYSTEM BAG RETRIEVAL 10MM (BASKET) ×1 IMPLANT
TROCAR ENDO BLADELESS 11MM (ENDOMECHANICALS) ×2 IMPLANT
TROCAR XCEL NON-BLD 5MMX100MML (ENDOMECHANICALS) ×2 IMPLANT
TROCAR XCEL UNIV SLVE 11M 100M (ENDOMECHANICALS) ×2 IMPLANT
TUBE CONNECTING 12X1/4 (SUCTIONS) ×2 IMPLANT
WARMER LAPAROSCOPE (MISCELLANEOUS) ×2 IMPLANT

## 2021-03-13 NOTE — Anesthesia Procedure Notes (Addendum)
Procedure Name: Intubation Date/Time: 03/13/2021 9:22 AM Performed by: Ollen Bowl, CRNA Pre-anesthesia Checklist: Patient identified, Patient being monitored, Timeout performed, Emergency Drugs available and Suction available Patient Re-evaluated:Patient Re-evaluated prior to induction Oxygen Delivery Method: Circle system utilized Preoxygenation: Pre-oxygenation with 100% oxygen Induction Type: IV induction Ventilation: Mask ventilation without difficulty Laryngoscope Size: Mac and 3 Grade View: Grade II Tube type: Oral Tube size: 7.0 mm Number of attempts: 1 Airway Equipment and Method: Stylet Placement Confirmation: ETT inserted through vocal cords under direct vision, positive ETCO2 and breath sounds checked- equal and bilateral Secured at: 21 cm Tube secured with: Tape Dental Injury: Teeth and Oropharynx as per pre-operative assessment

## 2021-03-13 NOTE — Interval H&P Note (Signed)
History and Physical Interval Note:  03/13/2021 8:32 AM  Vanessa Rice  has presented today for surgery, with the diagnosis of Cholelithiasis.  The various methods of treatment have been discussed with the patient and family. After consideration of risks, benefits and other options for treatment, the patient has consented to  Procedure(s): LAPAROSCOPIC CHOLECYSTECTOMY (N/A) as a surgical intervention.  The patient's history has been reviewed, patient examined, no change in status, stable for surgery.  I have reviewed the patient's chart and labs.  Questions were answered to the patient's satisfaction.     Franky Macho

## 2021-03-13 NOTE — Transfer of Care (Signed)
Immediate Anesthesia Transfer of Care Note  Patient: Vanessa Rice  Procedure(s) Performed: LAPAROSCOPIC CHOLECYSTECTOMY  Patient Location: PACU  Anesthesia Type:General  Level of Consciousness: awake  Airway & Oxygen Therapy: Patient Spontanous Breathing and Patient connected to face mask  Post-op Assessment: Report given to RN  Post vital signs: Reviewed and stable  Last Vitals:  Vitals Value Taken Time  BP 161/101 03/13/21 1020  Temp    Pulse 89 03/13/21 1022  Resp 11 03/13/21 1022  SpO2 100 % 03/13/21 1022  Vitals shown include unvalidated device data.  Last Pain:  Vitals:   03/13/21 0733  TempSrc: Oral  PainSc: 0-No pain      Patients Stated Pain Goal: 7 (03/13/21 0733)  Complications: No notable events documented.

## 2021-03-13 NOTE — Anesthesia Preprocedure Evaluation (Signed)
Anesthesia Evaluation  Patient identified by MRN, date of birth, ID band Patient awake    Reviewed: Allergy & Precautions, H&P , NPO status , Patient's Chart, lab work & pertinent test results, reviewed documented beta blocker date and time   Airway Mallampati: II  TM Distance: >3 FB Neck ROM: full    Dental no notable dental hx.    Pulmonary neg pulmonary ROS,    Pulmonary exam normal breath sounds clear to auscultation       Cardiovascular Exercise Tolerance: Good hypertension, negative cardio ROS   Rhythm:regular Rate:Normal     Neuro/Psych negative neurological ROS  negative psych ROS   GI/Hepatic negative GI ROS, Neg liver ROS,   Endo/Other  Morbid obesity  Renal/GU negative Renal ROS  negative genitourinary   Musculoskeletal   Abdominal   Peds  Hematology  (+) Blood dyscrasia, anemia ,   Anesthesia Other Findings   Reproductive/Obstetrics negative OB ROS                             Anesthesia Physical Anesthesia Plan  ASA: 3  Anesthesia Plan: General and General ETT   Post-op Pain Management:    Induction:   PONV Risk Score and Plan: Ondansetron and Scopolamine patch - Pre-op  Airway Management Planned:   Additional Equipment:   Intra-op Plan:   Post-operative Plan:   Informed Consent: I have reviewed the patients History and Physical, chart, labs and discussed the procedure including the risks, benefits and alternatives for the proposed anesthesia with the patient or authorized representative who has indicated his/her understanding and acceptance.     Dental Advisory Given  Plan Discussed with: CRNA  Anesthesia Plan Comments:         Anesthesia Quick Evaluation

## 2021-03-13 NOTE — Op Note (Addendum)
Patient:  Vanessa Rice  DOB:  1986/06/09  MRN:  [id number]   Preop Diagnosis: Biliary colic, cholelithiasis  Postop Diagnosis: Same  Procedure: Laparoscopic cholecystectomy  Surgeon: Franky Macho, MD  Asst:  Algis Greenhouse, MD  Anes: General endotracheal  Indications: Patient is a 35 year old white female who presents with biliary colic secondary to cholelithiasis.  The risks and benefits of the procedure including bleeding, infection, hepatobiliary injury, and the possibility of an open procedure were fully explained to the patient, who gave informed consent.  Procedure note: The patient was placed in the supine position.  After induction of general endotracheal anesthesia, the abdomen was prepped and draped using the usual sterile technique with ChloraPrep.  Surgical site confirmation was performed.  A supraumbilical incision was made down to the fascia.  Veress needle was introduced into the abdominal cavity and confirmation of placement was done using the saline drop test.  The abdomen was then insufflated to 15 mmHg pressure.  An 11 mm trocar was introduced into the abdominal cavity under direct visualization without difficulty.  The patient was placed in reverse Trendelenburg position and an additional 11 mm trocar was placed in the epigastric region and 5 mm trochars were placed the right upper quadrant and right flank regions.  Liver was inspected and noted to be within normal limits.  The gallbladder was retracted in a dynamic fashion in order to provide a critical view of the triangle of Calot.  The cystic duct was first identified.  Its junction with the infundibulum was fully identified.  Endoclips were placed proximally distally on the cystic duct, and the cystic duct was divided.  This was likewise done to the cystic artery.  The gallbladder was freed away from the gallbladder fossa using Bovie electrocautery.  The gallbladder was delivered through the epigastric trocar site  using an Endo Catch bag.  The gallbladder fossa was inspected and no abnormal bleeding or bile leakage was noted.  Surgicel was placed in the gallbladder fossa.  All fluid and air were then evacuated from the abdominal cavity prior to the removal of the trochars.  All wounds were irrigated with normal saline.  All wounds were injected with Exparel.  All incisions were closed using a 4-0 Monocryl subcuticular suture.  Dermabond was applied.  All tape and needle counts were correct at the end of the procedure.  The patient was extubated in the operating room and transferred to PACU in stable condition.  Complications: None  EBL: Minimal  Specimen: Gallbladder

## 2021-03-13 NOTE — Anesthesia Postprocedure Evaluation (Signed)
Anesthesia Post Note  Patient: Vanessa Rice  Procedure(s) Performed: LAPAROSCOPIC CHOLECYSTECTOMY  Patient location during evaluation: Phase II Anesthesia Type: General Level of consciousness: awake Pain management: pain level controlled Vital Signs Assessment: post-procedure vital signs reviewed and stable Respiratory status: spontaneous breathing and respiratory function stable Cardiovascular status: blood pressure returned to baseline and stable Postop Assessment: no headache and no apparent nausea or vomiting Anesthetic complications: no Comments: Late entry   No notable events documented.   Last Vitals:  Vitals:   03/13/21 1045 03/13/21 1100  BP: (!) 151/97 (!) 147/100  Pulse: 77 78  Resp: 17 18  Temp:    SpO2: 92% 100%    Last Pain:  Vitals:   03/13/21 1100  TempSrc:   PainSc: 4                  Windell Norfolk

## 2021-03-14 ENCOUNTER — Encounter (HOSPITAL_COMMUNITY): Payer: Self-pay | Admitting: General Surgery

## 2021-03-14 LAB — SURGICAL PATHOLOGY

## 2021-03-23 ENCOUNTER — Telehealth (INDEPENDENT_AMBULATORY_CARE_PROVIDER_SITE_OTHER): Payer: BC Managed Care – PPO | Admitting: General Surgery

## 2021-03-23 DIAGNOSIS — Z09 Encounter for follow-up examination after completed treatment for conditions other than malignant neoplasm: Secondary | ICD-10-CM

## 2021-03-23 NOTE — Telephone Encounter (Signed)
Postoperative telephone visit performed with patient.  She states she is doing well.  She is back to work.  Her preoperative symptoms have resolved.  I told her to call me should any problems arise.  As this was a part of the global surgical fee, this was not a billable visit.  Total telephone time was 1 minute.

## 2021-04-28 ENCOUNTER — Other Ambulatory Visit: Payer: Self-pay | Admitting: Women's Health

## 2021-05-14 DIAGNOSIS — M545 Low back pain, unspecified: Secondary | ICD-10-CM | POA: Diagnosis not present

## 2021-06-05 DIAGNOSIS — M6281 Muscle weakness (generalized): Secondary | ICD-10-CM | POA: Diagnosis not present

## 2021-06-05 DIAGNOSIS — M5459 Other low back pain: Secondary | ICD-10-CM | POA: Diagnosis not present

## 2021-06-05 DIAGNOSIS — M419 Scoliosis, unspecified: Secondary | ICD-10-CM | POA: Diagnosis not present

## 2021-08-04 ENCOUNTER — Ambulatory Visit: Payer: BC Managed Care – PPO | Admitting: Women's Health

## 2021-08-18 ENCOUNTER — Encounter: Payer: Self-pay | Admitting: Women's Health

## 2021-08-18 ENCOUNTER — Ambulatory Visit: Payer: BC Managed Care – PPO | Admitting: Women's Health

## 2021-08-18 ENCOUNTER — Other Ambulatory Visit: Payer: Self-pay

## 2021-08-18 VITALS — BP 120/78 | HR 68 | Ht 63.0 in | Wt 235.4 lb

## 2021-08-18 DIAGNOSIS — Z01419 Encounter for gynecological examination (general) (routine) without abnormal findings: Secondary | ICD-10-CM

## 2021-08-18 DIAGNOSIS — Z131 Encounter for screening for diabetes mellitus: Secondary | ICD-10-CM | POA: Diagnosis not present

## 2021-08-18 DIAGNOSIS — Z3041 Encounter for surveillance of contraceptive pills: Secondary | ICD-10-CM | POA: Diagnosis not present

## 2021-08-18 MED ORDER — LO LOESTRIN FE 1 MG-10 MCG / 10 MCG PO TABS: 1.0000 | ORAL_TABLET | Freq: Every day | ORAL | 3 refills | Status: DC

## 2021-08-18 NOTE — Progress Notes (Signed)
WELL-WOMAN EXAMINATION Patient name: Vanessa Rice MRN [id number]  Date of birth: July 18, 1985 Chief Complaint:   Gynecologic Exam (Last pap 09-18-19)  History of Present Illness:   Vanessa Rice is a 36 y.o. [phone] Caucasian female being seen today for a routine well-woman exam.  Current complaints: none  PCP: none      does not desire labs other than A1C (had GDM, then A1C was in pre-diabetes range at 5.7) Patient's last menstrual period was 07/18/2021. The current method of family planning is OCP (estrogen/progesterone).  Last pap 09/18/19. Results were: NILM w/ HRHPV negative. H/O abnormal pap: no Last mammogram: never. Results were: N/A. Family h/o breast cancer: yes PGM, MA Last colonoscopy: never. Results were: N/A. Family h/o colorectal cancer: no  Depression screen [facility] 2/9 02/01/2020 09/18/2019  Decreased Interest 0 0  Down, Depressed, Hopeless 0 0  PHQ - 2 Score 0 0  Altered sleeping 0 -  Tired, decreased energy 1 -  Change in appetite 0 -  Feeling bad or failure about yourself  0 -  Trouble concentrating 0 -  Moving slowly or fidgety/restless 0 -  Suicidal thoughts 0 -  PHQ-9 Score 1 -     GAD 7 : Generalized Anxiety Score 02/01/2020  Nervous, Anxious, on Edge 0  Control/stop worrying 0  Worry too much - different things 0  Trouble relaxing 0  Restless 0  Easily annoyed or irritable 0     Review of Systems:   Pertinent items are noted in HPI Denies any headaches, blurred vision, fatigue, shortness of breath, chest pain, abdominal pain, abnormal vaginal discharge/itching/odor/irritation, problems with periods, bowel movements, urination, or intercourse unless otherwise stated above. Pertinent History Reviewed:  Reviewed past medical,surgical, social and family history.  Reviewed problem list, medications and allergies. Physical Assessment:   Vitals:   08/18/21 1057 08/18/21 1128  BP: (!) 144/103 120/78  Pulse: 88 68  Weight: 235 lb 6.4 oz (106.8 kg)    Height: 5\' 3"  (1.6 m)   Body mass index is 41.7 kg/m.        Physical Examination:   General appearance - well appearing, and in no distress  Mental status - alert, oriented to person, place, and time  Psych:  She has a normal mood and affect  Skin - warm and dry, normal color, no suspicious lesions noted  Chest - effort normal, all lung fields clear to auscultation bilaterally  Heart - normal rate and regular rhythm  Neck:  midline trachea, no thyromegaly or nodules  Breasts - breasts appear normal, no suspicious masses, no skin or nipple changes or  axillary nodes  Abdomen - soft, nontender, nondistended, no masses or organomegaly  Pelvic - VULVA: normal appearing vulva with no masses, tenderness or lesions  VAGINA: normal appearing vagina with normal color and discharge, no lesions  CERVIX: normal appearing cervix without discharge or lesions, no CMT  Thin prep pap is not done   UTERUS: uterus is felt to be normal size, shape, consistency and nontender   ADNEXA: No adnexal masses or tenderness noted.  Extremities:  No swelling or varicosities noted  Chaperone: Angel Neas    No results found for this or any previous visit (from the past 24 hour(s)).  Assessment & Plan:  1) Well-Woman Exam  2) Initially elevated bp> normal on repeat, check bp's at home, let know if >140/90  3) Contraception management> refilled LoLo x 64yr  4) H/O GDM, pre-diabetes> A1C today  Labs/procedures today:  exam  Mammogram: @ 36yo, or sooner if problems Colonoscopy: @ 36yo, or sooner if problems  Orders Placed This Encounter  Procedures   Hemoglobin A1c    Meds: No orders of the defined types were placed in this encounter.   Follow-up: Return in about 1 year (around 08/18/2022) for Pap & physical.  Cheral Marker CNM, [facility] 08/18/2021 11:28 AM

## 2021-08-18 NOTE — Patient Instructions (Signed)
Primary Care Providers Dr. Zack Hall ([place]) [phone] [facility] [phone] [facility] ([place]) [phone] [facility] ([place]) [phone] [facility] () [phone] [facility] ([place]) [phone] [facility] [place] [phone] [facility] [phone]  

## 2021-08-19 LAB — HEMOGLOBIN A1C
Est. average glucose Bld gHb Est-mCnc: 111 mg/dL
Hgb A1c MFr Bld: 5.5 % (ref 4.8–5.6)

## 2022-07-13 ENCOUNTER — Other Ambulatory Visit: Payer: Self-pay | Admitting: Women's Health

## 2022-08-17 ENCOUNTER — Encounter: Payer: Self-pay | Admitting: Family Medicine

## 2022-08-17 ENCOUNTER — Ambulatory Visit: Payer: BC Managed Care – PPO | Admitting: Family Medicine

## 2022-08-17 DIAGNOSIS — O09299 Supervision of pregnancy with other poor reproductive or obstetric history, unspecified trimester: Secondary | ICD-10-CM

## 2022-08-17 DIAGNOSIS — Z130 Encounter for screening for diseases of the blood and blood-forming organs and certain disorders involving the immune mechanism: Secondary | ICD-10-CM

## 2022-08-17 DIAGNOSIS — Z13220 Encounter for screening for lipoid disorders: Secondary | ICD-10-CM | POA: Diagnosis not present

## 2022-08-17 HISTORY — DX: Encounter for screening for lipoid disorders: Z13.220

## 2022-08-17 NOTE — Assessment & Plan Note (Signed)
Has a family history of cardiovascular disease.  Screening lipid panel today.

## 2022-08-17 NOTE — Assessment & Plan Note (Signed)
BP normal today.

## 2022-08-17 NOTE — Progress Notes (Signed)
Subjective:  Patient ID: Vanessa Rice, female    DOB: [birth year]  Age: 37 y.o. MRN: [id number]  CC: Chief Complaint  Patient presents with   Establish Care    HPI:  37 year old female with history of preeclampsia and postpartum hypertension as well as history of gestational diabetes presents to establish care.  Patient states that she is working with a nutritionist and has lost a significant amount of weight.  I congratulated her on this.  Patient's blood pressure is well-controlled currently.  Patient's previous labs have reflected prediabetes.  Most recent A1c was within normal limits.  Patient is feeling well.  She states that she has had some decreased hearing and is concerned that she may have excessive cerumen.  Patient otherwise has no other complaints or concerns at this time.  Patient Active Problem List   Diagnosis Date Noted   Screening, lipid 08/17/2022   History of gestational diabetes 01/12/2020   Previous cesarean section 09/12/2015   Hx of preeclampsia & PPHTN 09/09/2015    Social Hx   Social History   Socioeconomic History   Marital status: Married    Spouse name: Not on file   Number of children: Not on file   Years of education: Not on file   Highest education level: Not on file  Occupational History   Not on file  Tobacco Use   Smoking status: Never   Smokeless tobacco: Never  Vaping Use   Vaping Use: Never used  Substance and Sexual Activity   Alcohol use: No   Drug use: No   Sexual activity: Yes    Partners: Male    Birth control/protection: None, Pill  Other Topics Concern   Not on file  Social History Narrative   Not on file   Social Determinants of Health   Financial Resource Strain: Not on file  Food Insecurity: No Food Insecurity (01/29/2020)   Hunger Vital Sign    Worried About Running Out of Food in the Last Year: Never true    Ran Out of Food in the Last Year: Never true  Transportation Needs: No Transportation Needs  (01/29/2020)   PRAPARE - Hydrologist (Medical): No    Lack of Transportation (Non-Medical): No  Physical Activity: Not on file  Stress: Not on file  Social Connections: Not on file    Review of Systems Per HPI  Objective:  BP 111/78   Pulse 85   Temp (!) 97.3 F (36.3 C)   Ht 5' 3"$  (1.6 m)   Wt 208 lb (94.3 kg)   LMP 08/13/2022 (Approximate)   SpO2 100%   BMI 36.85 kg/m      08/17/2022   10:06 AM 08/18/2021   11:28 AM 08/18/2021   10:57 AM  BP/Weight  Systolic BP [id number]  Diastolic BP 78 78 [id number]  Wt. (Lbs) 208  235.4  BMI 36.85 kg/m2  41.7 kg/m2    Physical Exam Vitals and nursing note reviewed.  Constitutional:      General: She is not in acute distress.    Appearance: Normal appearance. She is obese.  HENT:     Head: Normocephalic and atraumatic.     Right Ear: Tympanic membrane normal.     Left Ear: Tympanic membrane normal.  Eyes:     General:        Right eye: No discharge.        Left eye: No discharge.     Conjunctiva/sclera:  Conjunctivae normal.  Cardiovascular:     Rate and Rhythm: Normal rate and regular rhythm.  Pulmonary:     Effort: Pulmonary effort is normal.     Breath sounds: Normal breath sounds. No wheezing, rhonchi or rales.  Abdominal:     General: There is no distension.     Palpations: Abdomen is soft.     Tenderness: There is no abdominal tenderness.  Neurological:     Mental Status: She is alert.  Psychiatric:        Mood and Affect: Mood normal.        Behavior: Behavior normal.     Lab Results  Component Value Date   WBC 9.8 03/10/2021   HGB 13.5 03/10/2021   HCT 41.4 03/10/2021   PLT 290 03/10/2021   GLUCOSE 111 (H) 12/05/2020   ALT 11 01/25/2021   AST 9 01/25/2021   NA 137 12/05/2020   K 4.7 12/05/2020   CL 104 12/05/2020   CREATININE 0.71 12/05/2020   BUN 9 12/05/2020   CO2 19 (L) 12/05/2020   HGBA1C 5.5 08/18/2021     Assessment & Plan:   Problem List Items Addressed This  Visit       Other   Screening, lipid    Has a family history of cardiovascular disease.  Screening lipid panel today.      Relevant Orders   Lipid panel   Hx of preeclampsia & PPHTN    BP normal today.      Relevant Orders   CMP14+EGFR   Other Visit Diagnoses     Screening for deficiency anemia       Relevant Orders   CBC      Follow-up:  Return in about 1 year (around 08/18/2023).  [place]

## 2022-08-17 NOTE — Patient Instructions (Signed)
Congrats on the weight loss.  Labs today.  Follow up annually.

## 2022-08-18 LAB — CMP14+EGFR
ALT: 12 IU/L (ref 0–32)
AST: 13 IU/L (ref 0–40)
Albumin/Globulin Ratio: 1.6 (ref 1.2–2.2)
Albumin: 4.2 g/dL (ref 3.9–4.9)
Alkaline Phosphatase: 65 IU/L (ref 44–121)
BUN/Creatinine Ratio: 25 — ABNORMAL HIGH (ref 9–23)
BUN: 15 mg/dL (ref 6–20)
Bilirubin Total: 0.3 mg/dL (ref 0.0–1.2)
CO2: 19 mmol/L — ABNORMAL LOW (ref 20–29)
Calcium: 9.1 mg/dL (ref 8.7–10.2)
Chloride: 104 mmol/L (ref 96–106)
Creatinine, Ser: 0.6 mg/dL (ref 0.57–1.00)
Globulin, Total: 2.7 g/dL (ref 1.5–4.5)
Glucose: 84 mg/dL (ref 70–99)
Potassium: 4.7 mmol/L (ref 3.5–5.2)
Sodium: 139 mmol/L (ref 134–144)
Total Protein: 6.9 g/dL (ref 6.0–8.5)
eGFR: 119 mL/min/{1.73_m2} (ref >59)

## 2022-08-18 LAB — CBC
Hematocrit: 44 % (ref 34.0–46.6)
Hemoglobin: 15 g/dL (ref 11.1–15.9)
MCH: 31.3 pg (ref 26.6–33.0)
MCHC: 34.1 g/dL (ref 31.5–35.7)
MCV: 92 fL (ref 79–97)
Platelets: 332 10*3/uL (ref 150–450)
RBC: 4.8 x10E6/uL (ref 3.77–5.28)
RDW: 12 % (ref 11.7–15.4)
WBC: 8.3 10*3/uL (ref 3.4–10.8)

## 2022-08-18 LAB — LIPID PANEL
Chol/HDL Ratio: 4.7 ratio — ABNORMAL HIGH (ref 0.0–4.4)
Cholesterol, Total: 198 mg/dL (ref 100–199)
HDL: 42 mg/dL (ref >39)
LDL Chol Calc (NIH): 135 mg/dL — ABNORMAL HIGH (ref 0–99)
Triglycerides: 118 mg/dL (ref 0–149)
VLDL Cholesterol Cal: 21 mg/dL (ref 5–40)

## 2022-08-31 ENCOUNTER — Ambulatory Visit: Payer: BC Managed Care – PPO | Admitting: Advanced Practice Midwife

## 2022-08-31 ENCOUNTER — Other Ambulatory Visit (HOSPITAL_COMMUNITY)
Admission: RE | Admit: 2022-08-31 | Discharge: 2022-08-31 | Disposition: A | Discharge status: Home / Self Care | Payer: BC Managed Care – PPO | Source: Ambulatory Visit | Attending: Advanced Practice Midwife | Admitting: Advanced Practice Midwife

## 2022-08-31 ENCOUNTER — Encounter: Payer: Self-pay | Admitting: Advanced Practice Midwife

## 2022-08-31 VITALS — BP 119/89 | HR 80 | Ht 63.0 in | Wt 208.0 lb

## 2022-08-31 DIAGNOSIS — Z01419 Encounter for gynecological examination (general) (routine) without abnormal findings: Secondary | ICD-10-CM | POA: Diagnosis not present

## 2022-08-31 MED ORDER — LO LOESTRIN FE 1 MG-10 MCG / 10 MCG PO TABS: 1.0000 | ORAL_TABLET | Freq: Every day | ORAL | 12 refills | Status: DC

## 2022-08-31 NOTE — Progress Notes (Signed)
WELL-WOMAN EXAMINATION Patient name: Vanessa Rice MRN [id number]  Date of birth: [birth year] Chief Complaint:   Annual Exam  History of Present Illness:   Vanessa Rice is a 37 y.o. [phone] Caucasian female being seen today for a routine well-woman exam.  Current complaints: seeing a nutritionist and has lost 40lbs! Focusing on ^protein, decrease simple carbs, healthy fats and trying to get more exercise; doing well on Lo Lo (had 2 months of BTB that resolved)  PCP: DrJayce Lacinda Axon      does not desire labs Patient's last menstrual period was 08/13/2022 (approximate). The current method of family planning is OCP (estrogen/progesterone).  Last pap March 2021. Results were: NILM w/ HRHPV negative. H/O abnormal pap: no Last mammogram: never. Results were: N/A. Family h/o breast cancer: yes PGM, MA (age 53+) Last colonoscopy: never. Results were: N/A. Family h/o colorectal cancer: no     08/31/2022   10:12 AM 08/17/2022   10:11 AM 02/01/2020    2:09 PM 09/18/2019    9:22 AM  Depression screen PHQ 2/9  Decreased Interest 0 0 0 0  Down, Depressed, Hopeless 0 0 0 0  PHQ - 2 Score 0 0 0 0  Altered sleeping 0 0 0   Tired, decreased energy 1 0 1   Change in appetite 0 0 0   Feeling bad or failure about yourself  0 0 0   Trouble concentrating 0 0 0   Moving slowly or fidgety/restless 0 0 0   Suicidal thoughts 0 0 0   PHQ-9 Score 1 0 1   Difficult doing work/chores  Not difficult at all          08/31/2022   10:13 AM 08/17/2022   10:12 AM 02/01/2020    2:09 PM  GAD 7 : Generalized Anxiety Score  Nervous, Anxious, on Edge 0 0 0  Control/stop worrying 0 0 0  Worry too much - different things 0 0 0  Trouble relaxing 0 0 0  Restless 0 0 0  Easily annoyed or irritable 1 0 0  Afraid - awful might happen 0 0   Total GAD 7 Score 1 0   Anxiety Difficulty  Not difficult at all      Review of Systems:   Pertinent items are noted in HPI Denies any headaches, blurred vision, fatigue, shortness  of breath, chest pain, abdominal pain, abnormal vaginal discharge/itching/odor/irritation, problems with periods, bowel movements, urination, or intercourse unless otherwise stated above. Pertinent History Reviewed:  Reviewed past medical,surgical, social and family history.  Reviewed problem list, medications and allergies. Physical Assessment:   Vitals:   08/31/22 1008  BP: 119/89  Pulse: 80  Weight: 208 lb (94.3 kg)  Height: '5\' 3"'$  (1.6 m)  Body mass index is 36.85 kg/m.        Physical Examination:   General appearance - well appearing, and in no distress  Mental status - alert, oriented to person, place, and time  Psych:  She has a normal mood and affect  Skin - warm and dry, normal color, no suspicious lesions noted  Chest - effort normal, all lung fields clear to auscultation bilaterally  Heart - normal rate and regular rhythm  Neck:  midline trachea, no thyromegaly or nodules  Breasts - breasts appear normal, no suspicious masses, no skin or nipple changes or  axillary nodes  Abdomen - soft, nontender, nondistended, no masses or organomegaly  Pelvic - VULVA: normal appearing vulva with no masses, tenderness or  lesions  VAGINA: normal appearing vagina with normal color and discharge, no lesions  CERVIX: normal appearing cervix without discharge or lesions, no CMT  Thin prep pap is done with HR HPV cotesting  UTERUS: uterus is felt to be normal size, shape, consistency and nontender   ADNEXA: No adnexal masses or tenderness noted.  Rectal - not examined  Extremities:  No swelling or varicosities noted  Chaperone: Latisha Cresenzo    No results found for this or any previous visit (from the past 24 hour(s)).  Assessment & Plan:  1) Well-Woman Exam  2) Contraception management, refilled LoLo x 103yr 3) Making diet improvements, has lost 40lbs, encouraged to keep it up  Labs/procedures today: exam, Pap  Mammogram: @ 37yo or sooner if problems Colonoscopy: @ 37yo, or  sooner if problems  No orders of the defined types were placed in this encounter.   Meds:  Meds ordered this encounter  Medications   Norethindrone-Ethinyl Estradiol-Fe Biphas (LO LOESTRIN FE) 1 MG-10 MCG / 10 MCG tablet    Sig: Take 1 tablet by mouth daily.    Dispense:  28 tablet    Refill:  12    Order Specific Question:   Supervising Provider    Answer:   OJanyth Pupa[OS:[id number]   Follow-up: Return in about 1 year (around 08/31/2023) for Physical.  [facility]3/07/2022 10:37 AM

## 2022-09-04 LAB — CYTOLOGY - PAP
Comment: NEGATIVE
Diagnosis: NEGATIVE
High risk HPV: NEGATIVE

## 2022-10-12 IMAGING — US US ABDOMEN LIMITED
1 series · 14 of 25 positions shown · non-contrast
Comparison: None.

CLINICAL DATA: Abnormal liver function tests, right upper quadrant
abdominal pain

EXAM:
ULTRASOUND ABDOMEN LIMITED RIGHT UPPER QUADRANT

[Series 1: us abdomen limited ruq (liver/gb) · 14 of 54 slices shown]
[im 1/54]
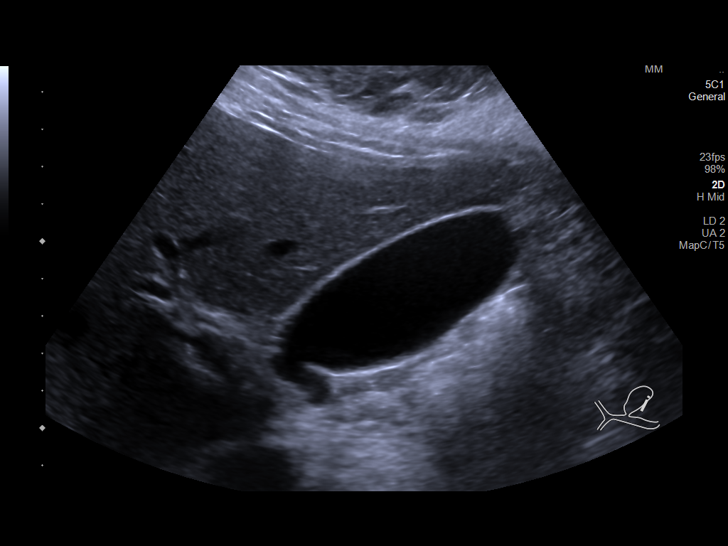
[im 5/54]
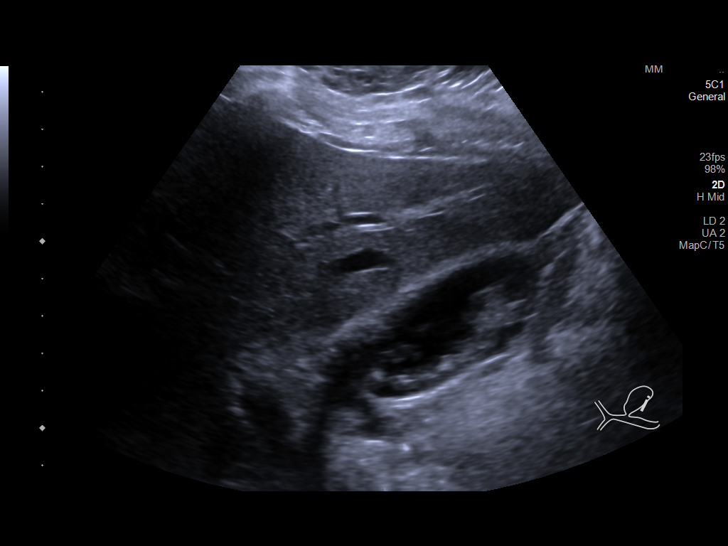
[im 9/54]
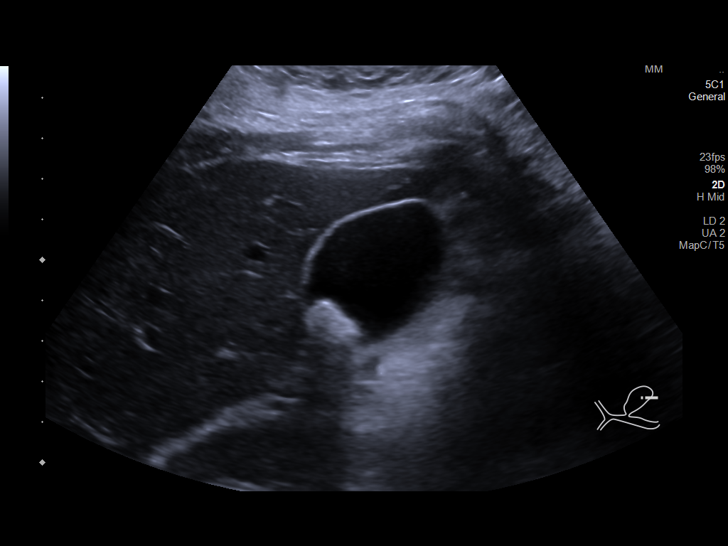
[im 14/54]
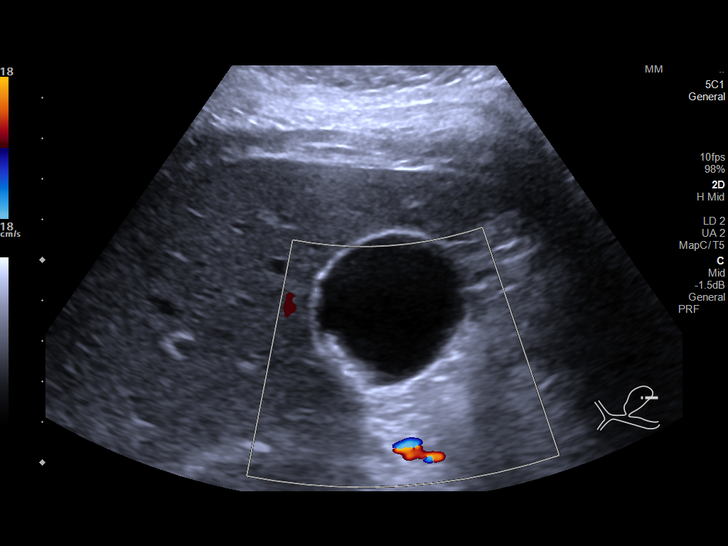
[im 18/54]
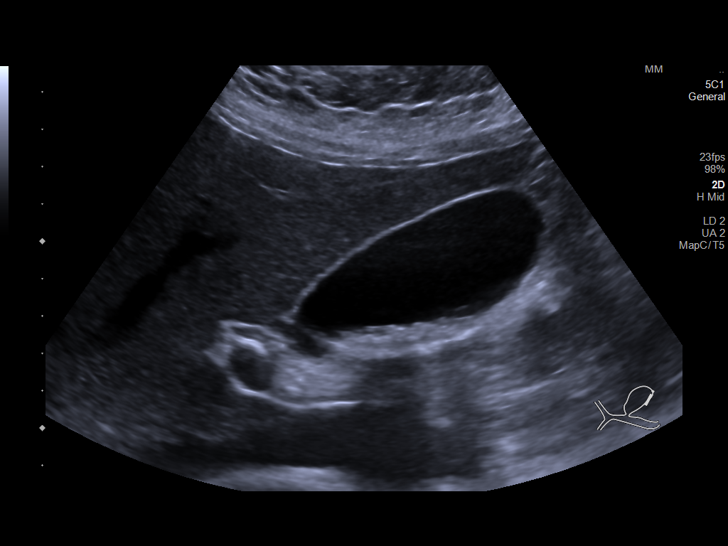
[im 20/54]
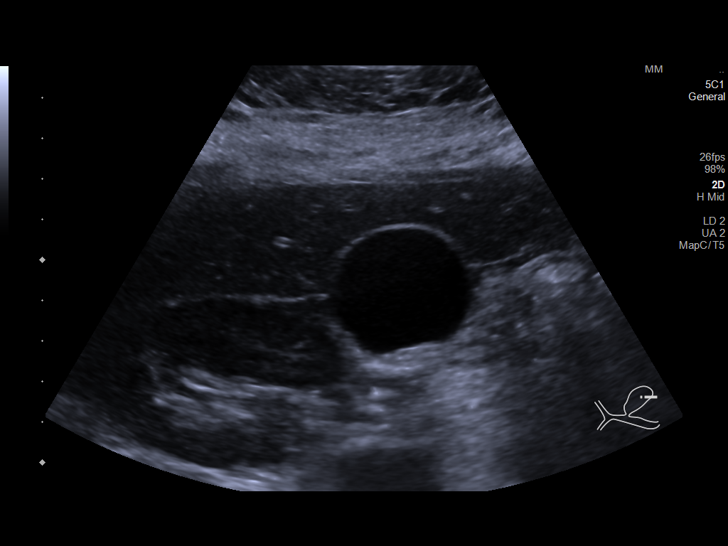
[im 25/54]
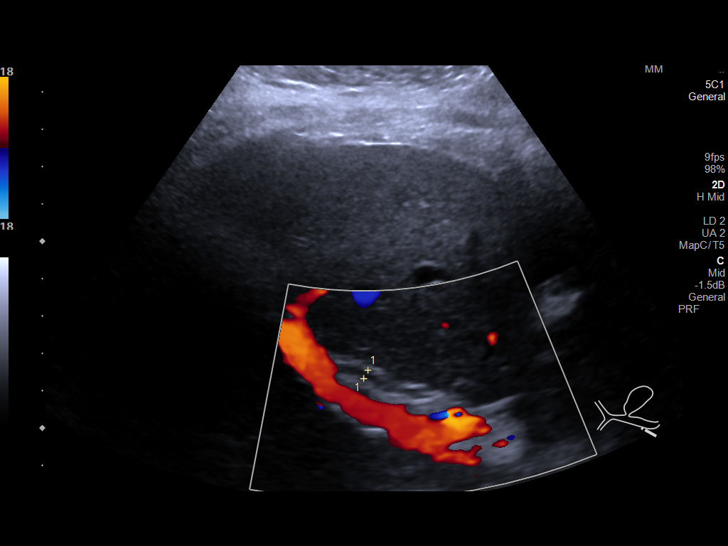
[im 29/54]
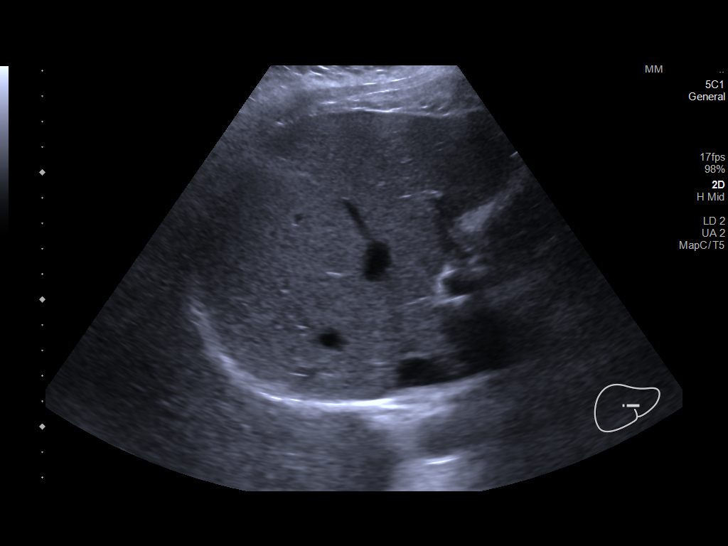
[im 34/54]
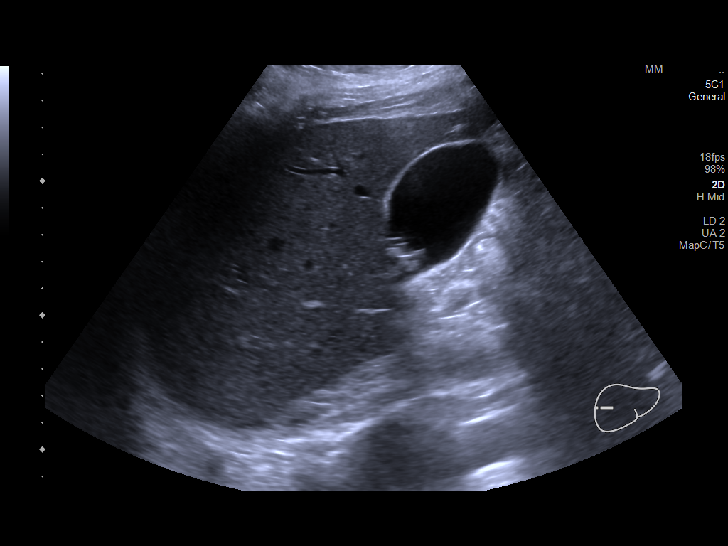
[im 36/54]
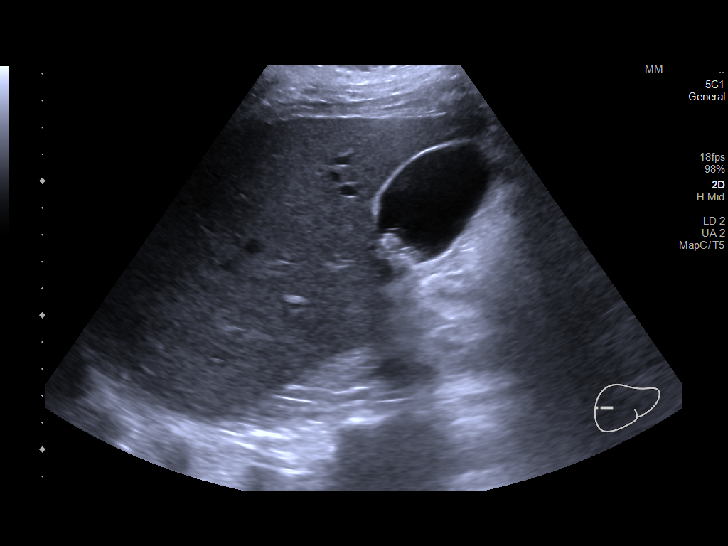
[im 40/54]
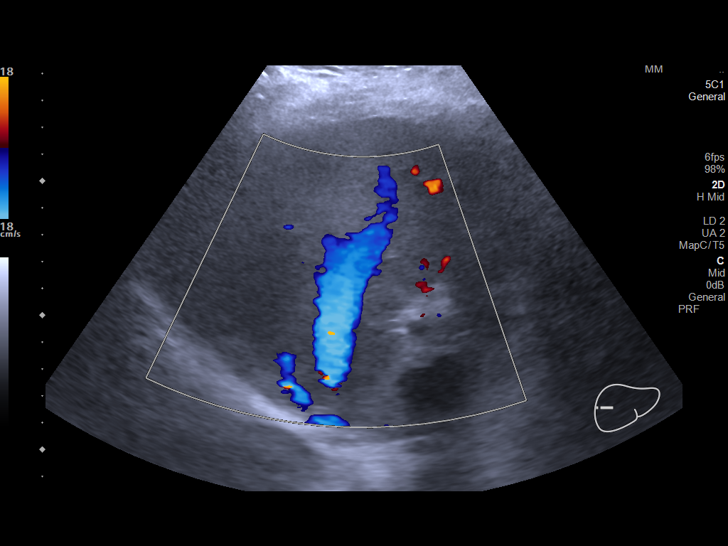
[im 45/54]
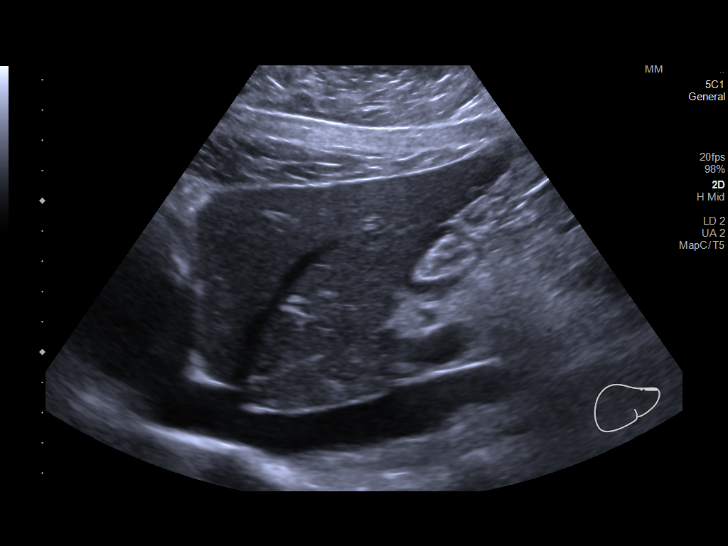
[im 49/54]
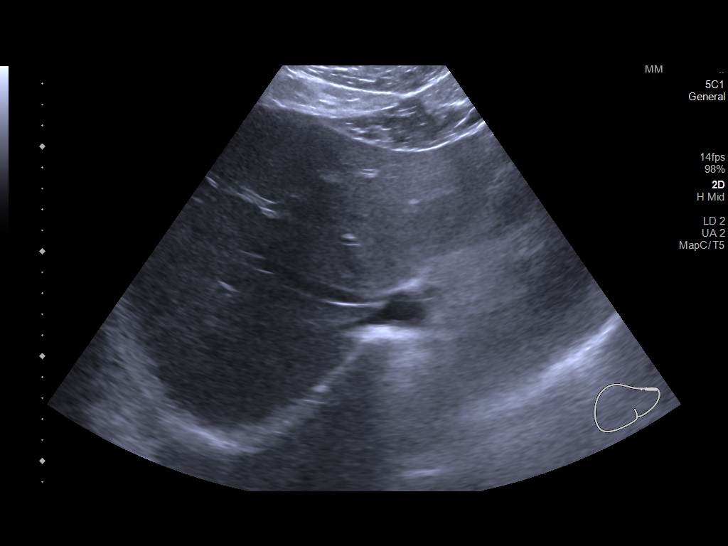
[im 54/54]
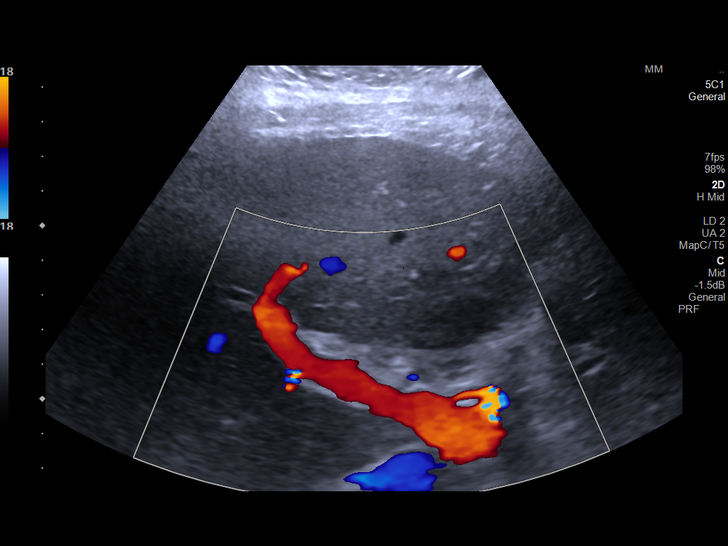

[14 of 25 positions shown; findings below may reference images not displayed]

FINDINGS: Gallbladder:

There is layering mobile sludge and small stones within the
gallbladder lumen.l the gallbladder, however, is not distended.
There is no gallbladder wall thickening or pericholecystic fluid
identified. The sonographic Murphy sign is reportedly negative.

Common bile duct:

Diameter: 3 mm in proximal to mid diameter.

Liver:

No focal lesion identified. Within normal limits in parenchymal
echogenicity. Portal vein is patent on color Doppler imaging with
normal direction of blood flow towards the liver.

Other: None.
IMPRESSION: Cholelithiasis without sonographic evidence of acute cholecystitis.

## 2023-02-08 DIAGNOSIS — S338XXA Sprain of other parts of lumbar spine and pelvis, initial encounter: Secondary | ICD-10-CM | POA: Diagnosis not present

## 2023-02-08 DIAGNOSIS — S233XXA Sprain of ligaments of thoracic spine, initial encounter: Secondary | ICD-10-CM | POA: Diagnosis not present

## 2023-02-08 DIAGNOSIS — S134XXA Sprain of ligaments of cervical spine, initial encounter: Secondary | ICD-10-CM | POA: Diagnosis not present

## 2023-02-13 DIAGNOSIS — S134XXA Sprain of ligaments of cervical spine, initial encounter: Secondary | ICD-10-CM | POA: Diagnosis not present

## 2023-02-13 DIAGNOSIS — S338XXA Sprain of other parts of lumbar spine and pelvis, initial encounter: Secondary | ICD-10-CM | POA: Diagnosis not present

## 2023-02-13 DIAGNOSIS — S233XXA Sprain of ligaments of thoracic spine, initial encounter: Secondary | ICD-10-CM | POA: Diagnosis not present

## 2023-02-22 DIAGNOSIS — S134XXA Sprain of ligaments of cervical spine, initial encounter: Secondary | ICD-10-CM | POA: Diagnosis not present

## 2023-02-22 DIAGNOSIS — S338XXA Sprain of other parts of lumbar spine and pelvis, initial encounter: Secondary | ICD-10-CM | POA: Diagnosis not present

## 2023-02-22 DIAGNOSIS — S233XXA Sprain of ligaments of thoracic spine, initial encounter: Secondary | ICD-10-CM | POA: Diagnosis not present

## 2023-03-01 ENCOUNTER — Other Ambulatory Visit (HOSPITAL_COMMUNITY): Payer: Self-pay

## 2023-03-08 DIAGNOSIS — S134XXA Sprain of ligaments of cervical spine, initial encounter: Secondary | ICD-10-CM | POA: Diagnosis not present

## 2023-03-08 DIAGNOSIS — S233XXA Sprain of ligaments of thoracic spine, initial encounter: Secondary | ICD-10-CM | POA: Diagnosis not present

## 2023-03-08 DIAGNOSIS — S338XXA Sprain of other parts of lumbar spine and pelvis, initial encounter: Secondary | ICD-10-CM | POA: Diagnosis not present

## 2023-03-22 DIAGNOSIS — S233XXA Sprain of ligaments of thoracic spine, initial encounter: Secondary | ICD-10-CM | POA: Diagnosis not present

## 2023-03-22 DIAGNOSIS — S338XXA Sprain of other parts of lumbar spine and pelvis, initial encounter: Secondary | ICD-10-CM | POA: Diagnosis not present

## 2023-03-22 DIAGNOSIS — S134XXA Sprain of ligaments of cervical spine, initial encounter: Secondary | ICD-10-CM | POA: Diagnosis not present

## 2023-05-20 ENCOUNTER — Other Ambulatory Visit (HOSPITAL_COMMUNITY): Payer: Self-pay

## 2023-05-20 ENCOUNTER — Other Ambulatory Visit: Payer: Self-pay | Admitting: Advanced Practice Midwife

## 2023-05-21 ENCOUNTER — Other Ambulatory Visit: Payer: Self-pay

## 2023-05-21 ENCOUNTER — Other Ambulatory Visit (HOSPITAL_COMMUNITY): Payer: Self-pay

## 2023-05-21 MED ORDER — LO LOESTRIN FE 1 MG-10 MCG / 10 MCG PO TABS
1.0000 | ORAL_TABLET | Freq: Every day | ORAL | 12 refills | Status: DC
  Filled 2023-05-21: qty 28, 28d supply, fill #0
  Filled 2023-06-13 – 2023-06-20 (×2): qty 28, 28d supply, fill #1
  Filled 2023-07-15: qty 28, 28d supply, fill #2
  Filled 2023-08-12: qty 28, 28d supply, fill #3

## 2023-05-24 ENCOUNTER — Other Ambulatory Visit (HOSPITAL_COMMUNITY): Payer: Self-pay

## 2023-06-13 ENCOUNTER — Other Ambulatory Visit: Payer: Self-pay

## 2023-06-21 ENCOUNTER — Other Ambulatory Visit: Payer: Self-pay

## 2023-07-15 ENCOUNTER — Other Ambulatory Visit: Payer: Self-pay

## 2023-08-13 ENCOUNTER — Other Ambulatory Visit: Payer: Self-pay

## 2023-09-06 ENCOUNTER — Other Ambulatory Visit (HOSPITAL_COMMUNITY): Payer: Self-pay

## 2023-09-06 ENCOUNTER — Other Ambulatory Visit: Payer: Self-pay | Admitting: Advanced Practice Midwife

## 2023-09-10 ENCOUNTER — Other Ambulatory Visit (HOSPITAL_COMMUNITY): Payer: Self-pay

## 2023-09-10 ENCOUNTER — Encounter: Payer: Self-pay | Admitting: Women's Health

## 2023-09-11 ENCOUNTER — Other Ambulatory Visit (HOSPITAL_COMMUNITY): Payer: Self-pay

## 2023-09-11 ENCOUNTER — Other Ambulatory Visit: Payer: Self-pay

## 2023-09-11 ENCOUNTER — Other Ambulatory Visit: Payer: Self-pay | Admitting: *Deleted

## 2023-09-11 MED ORDER — LO LOESTRIN FE 1 MG-10 MCG / 10 MCG PO TABS
1.0000 | ORAL_TABLET | Freq: Every day | ORAL | 4 refills | Status: AC
  Filled 2023-09-11: qty 84, 84d supply, fill #0
  Filled 2024-02-25: qty 84, 84d supply, fill #1

## 2023-09-13 ENCOUNTER — Other Ambulatory Visit (HOSPITAL_COMMUNITY): Payer: Self-pay

## 2023-09-13 MED FILL — Norethin-Eth Estradiol-Fe Tab 1 MG-10 MCG (24)/10 MCG (2): ORAL | 28 days supply | Qty: 28 | Fill #0 | Status: CN

## 2023-09-24 ENCOUNTER — Ambulatory Visit: Admitting: Obstetrics and Gynecology

## 2023-11-01 ENCOUNTER — Ambulatory Visit: Admitting: Obstetrics & Gynecology

## 2023-11-01 ENCOUNTER — Encounter: Payer: Self-pay | Admitting: Obstetrics & Gynecology

## 2023-11-01 VITALS — BP 126/88 | HR 80 | Ht 63.0 in | Wt 198.0 lb

## 2023-11-01 DIAGNOSIS — N938 Other specified abnormal uterine and vaginal bleeding: Secondary | ICD-10-CM | POA: Diagnosis not present

## 2023-11-01 DIAGNOSIS — Z01419 Encounter for gynecological examination (general) (routine) without abnormal findings: Secondary | ICD-10-CM | POA: Diagnosis not present

## 2023-11-01 MED ORDER — DROSPIRENONE-ESTETROL 3-14.2 MG PO TABS: 1.0000 | ORAL_TABLET | Freq: Every day | ORAL | Status: DC

## 2023-11-01 NOTE — Progress Notes (Signed)
 Subjective:     Vanessa Rice is a 38 y.o. female here for a routine exam.  No LMP recorded. (Menstrual status: Oral contraceptives). [id number] Birth Control Method:  COC(lolo estrin-->nextstellis 3 sample packs given Menstrual Calendar(currently): irregular  Current complaints: irregualr bleeding.   Current acute medical issues:  none   Recent Gynecologic History No LMP recorded. (Menstrual status: Oral contraceptives). Last Pap: 08/2022,  normal Last mammogram: ,    Past Medical History:  Diagnosis Date   Acute blood loss anemia 09/13/2015   Bronchial spasms    Gestational diabetes 01/12/2020   Mild preeclampsia 09/09/2015   Screening, lipid 08/17/2022    Past Surgical History:  Procedure Laterality Date   CESAREAN SECTION N/A 09/12/2015   Procedure: CESAREAN SECTION;  Surgeon: Wendelyn Halter, MD;  Location: [facility];  Service: Obstetrics;  Laterality: N/A;   CESAREAN SECTION N/A 03/08/2020   Procedure: CESAREAN SECTION;  Surgeon: Granville Layer, MD;  Location: [facility];  Service: Obstetrics;  Laterality: N/A;   CHOLECYSTECTOMY N/A 03/13/2021   Procedure: LAPAROSCOPIC CHOLECYSTECTOMY;  Surgeon: Alanda Allegra, MD;  Location: [facility];  Service: General;  Laterality: N/A;   WISDOM TOOTH EXTRACTION  04/2011    OB History     Gravida  4   Para  2   Term  2   Preterm  0   AB  2   Living  2      SAB  2   IAB  0   Ectopic  0   Multiple  0   Live Births  2           Social History   Socioeconomic History   Marital status: Married    Spouse name: Not on file   Number of children: Not on file   Years of education: Not on file   Highest education level: Not on file  Occupational History   Not on file  Tobacco Use   Smoking status: Never   Smokeless tobacco: Never  Vaping Use   Vaping status: Never Used  Substance and Sexual Activity   Alcohol use: No   Drug use: No   Sexual activity: Yes    Partners: Male    Birth control/protection: None, Pill  Other  Topics Concern   Not on file  Social History Narrative   Not on file   Social Drivers of Health   Financial Resource Strain: Low Risk  (11/01/2023)   Overall Financial Resource Strain (CARDIA)    Difficulty of Paying Living Expenses: Not hard at all  Food Insecurity: No Food Insecurity (11/01/2023)   Hunger Vital Sign    Worried About Running Out of Food in the Last Year: Never true    Ran Out of Food in the Last Year: Never true  Transportation Needs: No Transportation Needs (11/01/2023)   PRAPARE - Administrator, Civil Service (Medical): No    Lack of Transportation (Non-Medical): No  Physical Activity: Insufficiently Active (11/01/2023)   Exercise Vital Sign    Days of Exercise per Week: 4 days    Minutes of Exercise per Session: 30 min  Stress: No Stress Concern Present (11/01/2023)   [employer] of Occupational Health - Occupational Stress Questionnaire    Feeling of Stress : Only a little  Social Connections: Socially Integrated (11/01/2023)   Social Connection and Isolation Panel [NHANES]    Frequency of Communication with Friends and Family: More than three times a week  Frequency of Social Gatherings with Friends and Family: Three times a week    Attends Religious Services: More than 4 times per year    Active Member of Clubs or Organizations: Yes    Attends Engineer, structural: More than 4 times per year    Marital Status: Married    Family History  Problem Relation Age of Onset   Heart disease Paternal Grandfather    Heart disease Paternal Grandmother    Breast cancer Paternal Grandmother    Diabetes Maternal Grandmother    Thyroid disease Maternal Grandmother    Diabetes Maternal Grandfather    Hypertension Mother    Hyperlipidemia Mother    Polycystic ovary syndrome Sister    Breast cancer Maternal Aunt 52   Cancer Maternal Aunt 51       breast   Other Maternal Aunt        Platelet grandular defect   Cancer Paternal Aunt        breast  cancer     Current Outpatient Medications:    Drospirenone-Estetrol 3-14.2 MG TABS, Take 1 tablet by mouth daily., Disp: , Rfl:    Norethindrone -Ethinyl Estradiol -Fe Biphas (LO LOESTRIN FE ) 1 MG-10 MCG / 10 MCG tablet, Take 1 tablet by mouth daily., Disp: 28 tablet, Rfl: 12   Norethindrone -Ethinyl Estradiol -Fe Biphas (LO LOESTRIN FE ) 1 MG-10 MCG / 10 MCG tablet, Take 1 tablet by mouth daily., Disp: 84 tablet, Rfl: 4  Review of Systems  Review of Systems  Constitutional: Negative for fever, chills, weight loss, malaise/fatigue and diaphoresis.  HENT: Negative for hearing loss, ear pain, nosebleeds, congestion, sore throat, neck pain, tinnitus and ear discharge.   Eyes: Negative for blurred vision, double vision, photophobia, pain, discharge and redness.  Respiratory: Negative for cough, hemoptysis, sputum production, shortness of breath, wheezing and stridor.   Cardiovascular: Negative for chest pain, palpitations, orthopnea, claudication, leg swelling and PND.  Gastrointestinal: negative for abdominal pain. Negative for heartburn, nausea, vomiting, diarrhea, constipation, blood in stool and melena.  Genitourinary: Negative for dysuria, urgency, frequency, hematuria and flank pain.  Musculoskeletal: Negative for myalgias, back pain, joint pain and falls.  Skin: Negative for itching and rash.  Neurological: Negative for dizziness, tingling, tremors, sensory change, speech change, focal weakness, seizures, loss of consciousness, weakness and headaches.  Endo/Heme/Allergies: Negative for environmental allergies and polydipsia. Does not bruise/bleed easily.  Psychiatric/Behavioral: Negative for depression, suicidal ideas, hallucinations, memory loss and substance abuse. The patient is not nervous/anxious and does not have insomnia.        Objective:  Blood pressure 126/88, pulse 80, height 5\' 3"  (1.6 m), weight 198 lb (89.8 kg).   Physical Exam  Vitals reviewed. Constitutional: She is  oriented to person, place, and time. She appears well-developed and well-nourished.  HENT:  Head: Normocephalic and atraumatic.        Right Ear: External ear normal.  Left Ear: External ear normal.  Nose: Nose normal.  Mouth/Throat: Oropharynx is clear and moist.  Eyes: Conjunctivae and EOM are normal. Pupils are equal, round, and reactive to light. Right eye exhibits no discharge. Left eye exhibits no discharge. No scleral icterus.  Neck: Normal range of motion. Neck supple. No tracheal deviation present. No thyromegaly present.  Cardiovascular: Normal rate, regular rhythm, normal heart sounds and intact distal pulses.  Exam reveals no gallop and no friction rub.   No murmur heard. Respiratory: Effort normal and breath sounds normal. No respiratory distress. She has no wheezes. She has no rales. She  exhibits no tenderness.  GI: Soft. Bowel sounds are normal. She exhibits no distension and no mass. There is no tenderness. There is no rebound and no guarding.  Genitourinary:  Breasts no masses skin changes or nipple changes bilaterally      Vulva is normal without lesions Vagina is pink moist without discharge Cervix normal in appearance and pap is not done Uterus is normal size shape and contour Adnexa is negative with normal sized ovaries   Musculoskeletal: Normal range of motion. She exhibits no edema and no tenderness.  Neurological: She is alert and oriented to person, place, and time. She has normal reflexes. She displays normal reflexes. No cranial nerve deficit. She exhibits normal muscle tone. Coordination normal.  Skin: Skin is warm and dry. No rash noted. No erythema. No pallor.  Psychiatric: She has a normal mood and affect. Her behavior is normal. Judgment and thought content normal.       Medications Ordered at today's visit: Meds ordered this encounter  Medications   Drospirenone-Estetrol 3-14.2 MG TABS    Sig: Take 1 tablet by mouth daily.    Other orders placed at  today's visit: No orders of the defined types were placed in this encounter.    ASSESSMENT + PLAN:    ICD-10-CM   1. Well woman exam with routine gynecological exam  Z01.419     2. DUB (dysfunctional uterine bleeding) on Lo Lo estrin switch to nextstellis( 3 sample paks given)  N93.8           Return in about 2 years (around 10/31/2025), or if symptoms worsen or fail to improve.

## 2023-12-08 ENCOUNTER — Encounter: Payer: Self-pay | Admitting: Obstetrics & Gynecology

## 2023-12-09 ENCOUNTER — Other Ambulatory Visit: Payer: Self-pay | Admitting: *Deleted

## 2023-12-09 ENCOUNTER — Telehealth: Payer: Self-pay | Admitting: *Deleted

## 2023-12-09 MED ORDER — DROSPIRENONE-ESTETROL 3-14.2 MG PO TABS: ORAL_TABLET | ORAL | 0 refills | Status: AC

## 2023-12-09 NOTE — Telephone Encounter (Signed)
 Pharmacy called stating birth control prescription sent in did not seem to be the correct dosage for Nextstellis .  Confirmed medication sent in was correct dosage but was generic and medication does not come in generic.  Order given to dispense brand.

## 2024-02-25 ENCOUNTER — Other Ambulatory Visit (HOSPITAL_COMMUNITY): Payer: Self-pay

## 2024-02-25 MED FILL — Norethin-Eth Estradiol-Fe Tab 1 MG-10 MCG (24)/10 MCG (2): ORAL | 28 days supply | Qty: 28 | Fill #0 | Status: AC

## 2024-02-26 ENCOUNTER — Other Ambulatory Visit: Payer: Self-pay

## 2024-03-24 MED FILL — Norethin-Eth Estradiol-Fe Tab 1 MG-10 MCG (24)/10 MCG (2): ORAL | 28 days supply | Qty: 28 | Fill #1 | Status: AC

## 2024-03-25 ENCOUNTER — Other Ambulatory Visit: Payer: Self-pay

## 2024-04-20 MED FILL — Norethin-Eth Estradiol-Fe Tab 1 MG-10 MCG (24)/10 MCG (2): ORAL | 28 days supply | Qty: 28 | Fill #2 | Status: AC

## 2024-04-21 ENCOUNTER — Other Ambulatory Visit: Payer: Self-pay

## 2024-05-18 MED FILL — Norethin-Eth Estradiol-Fe Tab 1 MG-10 MCG (24)/10 MCG (2): ORAL | 28 days supply | Qty: 28 | Fill #3 | Status: AC

## 2024-05-19 ENCOUNTER — Other Ambulatory Visit: Payer: Self-pay

## 2024-06-17 ENCOUNTER — Other Ambulatory Visit (HOSPITAL_COMMUNITY): Payer: Self-pay

## 2024-06-17 MED FILL — Norethin-Eth Estradiol-Fe Tab 1 MG-10 MCG (24)/10 MCG (2): ORAL | 28 days supply | Qty: 28 | Fill #4 | Status: AC

## 2024-07-13 ENCOUNTER — Other Ambulatory Visit: Payer: Self-pay

## 2024-07-13 MED FILL — Norethin-Eth Estradiol-Fe Tab 1 MG-10 MCG (24)/10 MCG (2): ORAL | 28 days supply | Qty: 28 | Fill #5 | Status: AC
